# Patient Record
Sex: Female | Born: 1995 | Hispanic: No | Smoking: Current some day smoker
Health system: Southern US, Community
[De-identification: ages and names within clinical notes are randomized; demographics above are authoritative.]

## PROBLEM LIST (undated history)

## (undated) ENCOUNTER — Inpatient Hospital Stay (HOSPITAL_COMMUNITY): Payer: Self-pay

## (undated) DIAGNOSIS — R51 Headache: Secondary | ICD-10-CM

## (undated) DIAGNOSIS — F32A Depression, unspecified: Secondary | ICD-10-CM

## (undated) DIAGNOSIS — F329 Major depressive disorder, single episode, unspecified: Secondary | ICD-10-CM

## (undated) DIAGNOSIS — R569 Unspecified convulsions: Secondary | ICD-10-CM

## (undated) DIAGNOSIS — G40909 Epilepsy, unspecified, not intractable, without status epilepticus: Secondary | ICD-10-CM

## (undated) DIAGNOSIS — F419 Anxiety disorder, unspecified: Secondary | ICD-10-CM

## (undated) DIAGNOSIS — E669 Obesity, unspecified: Secondary | ICD-10-CM

## (undated) DIAGNOSIS — F909 Attention-deficit hyperactivity disorder, unspecified type: Secondary | ICD-10-CM

## (undated) HISTORY — DX: Epilepsy, unspecified, not intractable, without status epilepticus: G40.909

## (undated) HISTORY — DX: Depression, unspecified: F32.A

## (undated) HISTORY — DX: Anxiety disorder, unspecified: F41.9

## (undated) HISTORY — PX: ANKLE SURGERY: SHX546

## (undated) HISTORY — PX: ADENOIDECTOMY: SUR15

## (undated) HISTORY — PX: TONSILLECTOMY: SUR1361

## (undated) HISTORY — DX: Major depressive disorder, single episode, unspecified: F32.9

---
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
## 2017-03-10 ENCOUNTER — Ambulatory Visit: Payer: Self-pay | Admitting: Family Medicine

## 2017-03-11 ENCOUNTER — Encounter: Payer: Self-pay | Admitting: Family Medicine

## 2017-10-29 ENCOUNTER — Other Ambulatory Visit: Payer: Self-pay

## 2017-10-29 ENCOUNTER — Emergency Department (HOSPITAL_COMMUNITY)
Admission: EM | Admit: 2017-10-29 | Discharge: 2017-10-29 | Disposition: A | Discharge status: Home / Self Care | Payer: Medicaid Other | Attending: Emergency Medicine | Admitting: Emergency Medicine

## 2017-10-29 ENCOUNTER — Encounter (HOSPITAL_COMMUNITY): Payer: Self-pay | Admitting: *Deleted

## 2017-10-29 ENCOUNTER — Emergency Department (HOSPITAL_COMMUNITY): Payer: Medicaid Other

## 2017-10-29 DIAGNOSIS — Y939 Activity, unspecified: Secondary | ICD-10-CM | POA: Diagnosis not present

## 2017-10-29 DIAGNOSIS — S20229A Contusion of unspecified back wall of thorax, initial encounter: Secondary | ICD-10-CM | POA: Insufficient documentation

## 2017-10-29 DIAGNOSIS — S39012A Strain of muscle, fascia and tendon of lower back, initial encounter: Secondary | ICD-10-CM | POA: Diagnosis not present

## 2017-10-29 DIAGNOSIS — Z87891 Personal history of nicotine dependence: Secondary | ICD-10-CM | POA: Diagnosis not present

## 2017-10-29 DIAGNOSIS — Y999 Unspecified external cause status: Secondary | ICD-10-CM | POA: Diagnosis not present

## 2017-10-29 DIAGNOSIS — Y929 Unspecified place or not applicable: Secondary | ICD-10-CM | POA: Insufficient documentation

## 2017-10-29 DIAGNOSIS — Z041 Encounter for examination and observation following transport accident: Secondary | ICD-10-CM | POA: Diagnosis present

## 2017-10-29 LAB — PREGNANCY, URINE: PREG TEST UR: NEGATIVE
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
## 2018-01-03 ENCOUNTER — Encounter (HOSPITAL_COMMUNITY): Payer: Self-pay | Admitting: Emergency Medicine

## 2018-01-03 ENCOUNTER — Emergency Department (HOSPITAL_COMMUNITY)
Admission: EM | Admit: 2018-01-03 | Discharge: 2018-01-03 | Disposition: A | Discharge status: Home / Self Care | Payer: Medicaid Other | Attending: Emergency Medicine | Admitting: Emergency Medicine

## 2018-01-03 ENCOUNTER — Other Ambulatory Visit: Payer: Self-pay

## 2018-01-03 DIAGNOSIS — Z87891 Personal history of nicotine dependence: Secondary | ICD-10-CM | POA: Diagnosis not present

## 2018-01-03 DIAGNOSIS — R1010 Upper abdominal pain, unspecified: Secondary | ICD-10-CM | POA: Insufficient documentation

## 2018-01-03 DIAGNOSIS — N938 Other specified abnormal uterine and vaginal bleeding: Secondary | ICD-10-CM

## 2018-01-03 DIAGNOSIS — Z91010 Allergy to peanuts: Secondary | ICD-10-CM | POA: Insufficient documentation

## 2018-01-03 LAB — URINALYSIS, ROUTINE W REFLEX MICROSCOPIC
Bilirubin Urine: NEGATIVE
GLUCOSE, UA: NEGATIVE mg/dL
HGB URINE DIPSTICK: NEGATIVE
KETONES UR: NEGATIVE mg/dL
Leukocytes, UA: NEGATIVE
Nitrite: NEGATIVE
PROTEIN: NEGATIVE mg/dL
Specific Gravity, Urine: 1.029 (ref 1.005–1.030)
pH: 6 (ref 5.0–8.0)

## 2018-01-03 LAB — CBC WITH DIFFERENTIAL/PLATELET
BASOS ABS: 0 10*3/uL (ref 0.0–0.1)
Basophils Relative: 0 %
EOS ABS: 0.2 10*3/uL (ref 0.0–0.7)
EOS PCT: 1 %
HCT: 43.9 % (ref 36.0–46.0)
Hemoglobin: 14.8 g/dL (ref 12.0–15.0)
LYMPHS ABS: 1.6 10*3/uL (ref 0.7–4.0)
Lymphocytes Relative: 12 %
MCH: 30.6 pg (ref 26.0–34.0)
MCHC: 33.7 g/dL (ref 30.0–36.0)
MCV: 90.9 fL (ref 78.0–100.0)
Monocytes Absolute: 0.8 10*3/uL (ref 0.1–1.0)
Monocytes Relative: 6 %
Neutro Abs: 11.1 10*3/uL — ABNORMAL HIGH (ref 1.7–7.7)
Neutrophils Relative %: 81 %
PLATELETS: 274 10*3/uL (ref 150–400)
RBC: 4.83 MIL/uL (ref 3.87–5.11)
RDW: 12.5 % (ref 11.5–15.5)
WBC: 13.6 10*3/uL — AB (ref 4.0–10.5)

## 2018-01-03 LAB — WET PREP, GENITAL
Clue Cells Wet Prep HPF POC: NONE SEEN
Sperm: NONE SEEN
Trich, Wet Prep: NONE SEEN
YEAST WET PREP: NONE SEEN

## 2018-01-03 LAB — BASIC METABOLIC PANEL
Anion gap: 4 — ABNORMAL LOW (ref 5–15)
BUN: 12 mg/dL (ref 6–20)
CALCIUM: 8.3 mg/dL — AB (ref 8.9–10.3)
CO2: 26 mmol/L (ref 22–32)
Chloride: 108 mmol/L (ref 98–111)
Creatinine, Ser: 0.73 mg/dL (ref 0.44–1.00)
GFR calc Af Amer: >60 mL/min (ref >60)
GLUCOSE: 110 mg/dL — AB (ref 70–99)
POTASSIUM: 3.7 mmol/L (ref 3.5–5.1)
SODIUM: 138 mmol/L (ref 135–145)

## 2018-01-03 LAB — I-STAT BETA HCG BLOOD, ED (MC, WL, AP ONLY): I-stat hCG, quantitative: <5 m[IU]/mL (ref <5)

## 2018-01-03 MED ORDER — SODIUM CHLORIDE 0.9 % IV BOLUS
1000.0000 mL | Freq: Once | INTRAVENOUS | Status: AC
  Administered 2018-01-03: 1000 mL via INTRAVENOUS

## 2018-01-03 NOTE — ED Provider Notes (Signed)
[facility] EMERGENCY DEPARTMENT Provider Note   CSN: [id number] Arrival date & time: 01/03/18  1634     History   Chief Complaint Chief Complaint  Patient presents with  . Vaginal Bleeding    HPI Pamela Mccarty is a 22 y.o. female.  Pt presents to the ED today with heavy vaginal bleeding.  Pt has a hx of irregular periods and only gets them every few months.  She's had heavy bleeding for the past 4 days.  She said she passed out last night, which worried her and caused her to come in today.  The pt has never seen obgyn.  She does also c/o upper abdominal pain.     Past Medical History:  Diagnosis Date  . ADHD (attention deficit hyperactivity disorder)   . Anxiety   . Depression   . Headache(784.0)   . Obesity     Patient Active Problem List   Diagnosis Date Noted  . Opioid use disorder, severe, dependence (HCC) 12/10/2015  . Suicidal ideation   . 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 Attention-deficit hyperactivity disorder, 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  . Oppositional defiant disorder 06/28/2011    Past Surgical History:  Procedure Laterality Date  . ADENOIDECTOMY    . ANKLE SURGERY    . ANKLE SURGERY  at 22yo   Pt. reports to lengthen her tendons  . TONSILLECTOMY       OB History    Gravida  2   Para      Term      Preterm      AB  1   Living        SAB      TAB      Ectopic      Multiple      Live Births               Home Medications    Prior to Admission medications   Not on File    Family History Family History  Problem Relation Age of Onset  . Bipolar disorder Mother   . Cancer Mother   . Diabetes Mother   . Hypertension Mother   . Hypertension Father   . Stroke Father     Social History Social History   Tobacco Use  . Smoking status: Former Smoker    Packs/day: 0.10    Types: Cigarettes    Last attempt to quit: 01/13/2016    Years since quitting: 1.9  .  Smokeless tobacco: Never Used  Substance Use Topics  . Alcohol use: No  . Drug use: No     Allergies   Peanut-containing drug products   Review of Systems Review of Systems  Gastrointestinal: Positive for abdominal pain.  Genitourinary: Positive for vaginal bleeding.  All other systems reviewed and are negative.    Physical Exam Updated Vital Signs BP 133/79 (BP Location: Right Arm)   Pulse (!) 124   Temp 98.2 F (36.8 C) (Oral)   Resp 20   Ht 5\' 6"  (1.676 m)   Wt 72.6 kg   LMP 08/19/2017   SpO2 98%   BMI 25.82 kg/m   Physical Exam  Constitutional: She is oriented to person, place, and time. She appears well-developed and well-nourished.  HENT:  Head: Normocephalic and atraumatic.  Right Ear: External ear normal.  Left Ear: External ear normal.  Nose: Nose normal.  Mouth/Throat: Oropharynx is clear and moist.  Eyes: Pupils are equal, round, and reactive  to light. Conjunctivae and EOM are normal.  Neck: Normal range of motion. Neck supple.  Cardiovascular: Regular rhythm, normal heart sounds and intact distal pulses. Tachycardia present.  Pulmonary/Chest: Effort normal and breath sounds normal.  Abdominal: Soft. Bowel sounds are normal.  Genitourinary: Uterus is tender. Cervix exhibits no motion tenderness. Right adnexum displays no tenderness. Left adnexum displays no tenderness. There is bleeding in the vagina.  Musculoskeletal: Normal range of motion.  Neurological: She is alert and oriented to person, place, and time.  Skin: Skin is warm. Capillary refill takes less than 2 seconds.  Psychiatric: She has a normal mood and affect. Her behavior is normal. Judgment and thought content normal.  Nursing note and vitals reviewed.    ED Treatments / Results  Labs (all labs ordered are listed, but only abnormal results are displayed) Labs Reviewed  WET PREP, GENITAL - Abnormal; Notable for the following components:      Result Value   WBC, Wet Prep HPF POC FEW  (*)    All other components within normal limits  CBC WITH DIFFERENTIAL/PLATELET - Abnormal; Notable for the following components:   WBC 13.6 (*)    Neutro Abs 11.1 (*)    All other components within normal limits  BASIC METABOLIC PANEL - Abnormal; Notable for the following components:   Glucose, Bld 110 (*)    Calcium 8.3 (*)    Anion gap 4 (*)    All other components within normal limits  URINALYSIS, ROUTINE W REFLEX MICROSCOPIC  I-STAT BETA HCG BLOOD, ED ([facility], [facility], [facility] ONLY)  I-STAT BETA HCG BLOOD, ED ([facility], [facility], [facility] ONLY)  GC/CHLAMYDIA PROBE AMP ([place]) NOT AT [facility]    EKG None  Radiology No results found.  Procedures Procedures (including critical care time)  Medications Ordered in ED Medications  sodium chloride 0.9 % bolus 1,000 mL (0 mLs Intravenous Stopped 01/03/18 1953)     Initial Impression / Assessment and Plan / ED Course  I have reviewed the triage vital signs and the nursing notes.  Pertinent labs & imaging results that were available during my care of the patient were reviewed by me and considered in my medical decision making (see chart for details).    Labs are ok.  Pt is feeling better after IVFs.  She is instructed to f/u with obgyn and return if worse.  Final Clinical Impressions(s) / ED Diagnoses   Final diagnoses:  Dysfunctional uterine bleeding    ED Discharge Orders    None       Jacalyn Lefevre, MD 01/03/18 2109

## 2018-01-03 NOTE — ED Notes (Signed)
Pt ambulatory to bathroom and back to room 

## 2018-01-03 NOTE — ED Notes (Signed)
Pelvic cart at bedside. 

## 2018-01-03 NOTE — ED Triage Notes (Signed)
Patient c/o heavy vaginal bleeding x4 days. Per patient medium sized clots. Patient states upper abd pain. Per patient nausea and vomiting. Per boyfriend patient had LOC x 20 minutes last night in which patient's eyes rolled in back of head.

## 2018-01-05 LAB — GC/CHLAMYDIA PROBE AMP (~~LOC~~) NOT AT ARMC
Chlamydia: NEGATIVE
NEISSERIA GONORRHEA: NEGATIVE

## 2018-01-19 ENCOUNTER — Encounter: Payer: Self-pay | Admitting: Obstetrics & Gynecology

## 2018-01-19 ENCOUNTER — Ambulatory Visit (INDEPENDENT_AMBULATORY_CARE_PROVIDER_SITE_OTHER): Payer: Medicaid Other | Admitting: Obstetrics & Gynecology

## 2018-01-19 VITALS — BP 132/81 | HR 92 | Ht 66.0 in | Wt 226.0 lb

## 2018-01-19 DIAGNOSIS — N970 Female infertility associated with anovulation: Secondary | ICD-10-CM | POA: Diagnosis not present

## 2018-01-19 MED ORDER — DESOGESTREL-ETHINYL ESTRADIOL 0.15-30 MG-MCG PO TABS
1.0000 | ORAL_TABLET | Freq: Every day | ORAL | 11 refills | Status: DC
Start: 2018-01-19 — End: 2020-03-14

## 2018-01-19 MED ORDER — MEGESTROL ACETATE 40 MG PO TABS: ORAL_TABLET | ORAL | 0 refills | Status: DC

## 2018-01-19 NOTE — Progress Notes (Signed)
Patient ID: Pamela Mccarty, female   DOB: Dec 30, 1995, 22 y.o.   MRN: [id number]      Chief Complaint  Patient presents with  . Follow-up      22 y.o. G2P0010 Patient's last menstrual period was 12/31/2017. The current method of family planning is none.  Outpatient Encounter Medications as of 01/19/2018  Medication Sig  . desogestrel-ethinyl estradiol (APRI,EMOQUETTE,SOLIA) 0.15-30 MG-MCG tablet Take 1 tablet by mouth daily.  . megestrol (MEGACE) 40 MG tablet 1 tablet daily   No facility-administered encounter medications on file as of 01/19/2018.     Subjective Pt is seen as  Follow up from ED Long history of irregualr menses 3-4 per year since she started Heavy periods prolonged bleeding several times a year Labs normal Past Medical History:  Diagnosis Date  . ADHD (attention deficit hyperactivity disorder)   . Anxiety   . Depression   . Headache(784.0)   . Obesity     Past Surgical History:  Procedure Laterality Date  . ADENOIDECTOMY    . ANKLE SURGERY    . ANKLE SURGERY  at 22yo   Pt. reports to lengthen her tendons  . TONSILLECTOMY      OB History    Gravida  2   Para      Term      Preterm      AB  1   Living        SAB      TAB      Ectopic      Multiple      Live Births              Allergies  Allergen Reactions  . Peanut-Containing Drug Products Anaphylaxis    Stomach bubbles with Peanut oil only  (Patient states that she is not allergic to this)  . Latex     Social History   Socioeconomic History  . Marital status: Single    Spouse name: Not on file  . Number of children: Not on file  . Years of education: Not on file  . Highest education level: Not on file  Occupational History  . Occupation: Consulting civil engineer    Comment: 8th grade at [employer]  Social Needs  . Financial resource strain: Not on file  . Food insecurity:    Worry: Not on file    Inability: Not on file  . Transportation needs:   Medical: Not on file    Non-medical: Not on file  Tobacco Use  . Smoking status: Former Smoker    Packs/day: 0.10    Types: Cigarettes    Last attempt to quit: 01/13/2016    Years since quitting: 2.0  . Smokeless tobacco: Never Used  Substance and Sexual Activity  . Alcohol use: No  . Drug use: No  . Sexual activity: Never    Comment: reports she is "gay", has had a boyfriend in past, but states she is only interested in girls, does not want mom to know  Lifestyle  . Physical activity:    Days per week: Not on file    Minutes per session: Not on file  . Stress: Not on file  Relationships  . Social connections:    Talks on phone: Not on file    Gets together: Not on file    Attends religious service: Not on file    Active member of club or organization: Not on file    Attends meetings of clubs or organizations: Not  on file    Relationship status: Not on file  Other Topics Concern  . Not on file  Social History Narrative  . Not on file    Family History  Problem Relation Age of Onset  . Bipolar disorder Mother   . Cancer Mother   . Diabetes Mother   . Hypertension Mother   . Hypertension Father   . Stroke Father     Medications:       Current Outpatient Medications:  .  desogestrel-ethinyl estradiol (APRI,EMOQUETTE,SOLIA) 0.15-30 MG-MCG tablet, Take 1 tablet by mouth daily., Disp: 1 Package, Rfl: 11 .  megestrol (MEGACE) 40 MG tablet, 1 tablet daily, Disp: 30 tablet, Rfl: 0  Objective Blood pressure 132/81, pulse 92, height 5\' 6"  (1.676 m), weight 226 lb (102.5 kg), last menstrual period 12/31/2017.  Gen obese WF NAD   Pertinent ROS No burning with urination, frequency or urgency No nausea, vomiting or diarrhea Nor fever chills or other constitutional symptoms   Labs or studies reviewed    Impression Diagnoses this Encounter::   ICD-10-CM   1. Anovulatory (dysfunctional uterine) bleeding N97.0     Established relevant  diagnosis(es):   Plan/Recommendations: Meds ordered this encounter  Medications  . megestrol (MEGACE) 40 MG tablet    Sig: 1 tablet daily    Dispense:  30 tablet    Refill:  0  . desogestrel-ethinyl estradiol (APRI,EMOQUETTE,SOLIA) 0.15-30 MG-MCG tablet    Sig: Take 1 tablet by mouth daily.    Dispense:  1 Package    Refill:  11    Labs or Scans Ordered: No orders of the defined types were placed in this encounter.   Management:: Megestrol for 3 weeks then OCP for anouvlatory bleeding Probable PCOS  Follow up Return in about 6 months (around 07/21/2018) for Follow up, with Dr Despina Hidden.     Face to face time:  20 minutes  Greater than 50% of the visit time was spent in counseling and coordination of care with the patient.  The summary and outline of the counseling and care coordination is summarized in the note above.   All questions were answered.      All questions were answered.

## 2018-02-09 ENCOUNTER — Telehealth: Payer: Self-pay | Admitting: Obstetrics & Gynecology

## 2018-02-09 NOTE — Telephone Encounter (Signed)
Called patient back and heard message that mailbox was full. Will try again tomorrow.

## 2018-02-09 NOTE — Telephone Encounter (Signed)
P Patient called, stated that she thinks the medication that she was prescribed is causing her to not sleep and vomit blood.  She's not sure if this is a side affect or not or if she needs to be seen. [facility] [place]  [phone]

## 2018-02-27 ENCOUNTER — Emergency Department (HOSPITAL_COMMUNITY): Payer: Medicaid Other

## 2018-02-27 ENCOUNTER — Other Ambulatory Visit: Payer: Self-pay

## 2018-02-27 ENCOUNTER — Encounter (HOSPITAL_COMMUNITY): Payer: Self-pay | Admitting: Emergency Medicine

## 2018-02-27 ENCOUNTER — Observation Stay (HOSPITAL_COMMUNITY)
Admission: EM | Admit: 2018-02-27 | Discharge: 2018-02-28 | Disposition: A | Discharge status: Home / Self Care | Payer: Medicaid Other | Attending: Internal Medicine | Admitting: Internal Medicine

## 2018-02-27 DIAGNOSIS — Z91010 Allergy to peanuts: Secondary | ICD-10-CM | POA: Diagnosis not present

## 2018-02-27 DIAGNOSIS — R569 Unspecified convulsions: Principal | ICD-10-CM | POA: Insufficient documentation

## 2018-02-27 DIAGNOSIS — Z91040 Latex allergy status: Secondary | ICD-10-CM | POA: Insufficient documentation

## 2018-02-27 DIAGNOSIS — F17210 Nicotine dependence, cigarettes, uncomplicated: Secondary | ICD-10-CM | POA: Insufficient documentation

## 2018-02-27 DIAGNOSIS — G40909 Epilepsy, unspecified, not intractable, without status epilepticus: Secondary | ICD-10-CM

## 2018-02-27 HISTORY — DX: Unspecified convulsions: R56.9

## 2018-02-27 LAB — I-STAT CHEM 8, ED
BUN: 14 mg/dL (ref 6–20)
CALCIUM ION: 1.24 mmol/L (ref 1.15–1.40)
CREATININE: 0.8 mg/dL (ref 0.44–1.00)
Chloride: 107 mmol/L (ref 98–111)
GLUCOSE: 83 mg/dL (ref 70–99)
HEMATOCRIT: 45 % (ref 36.0–46.0)
HEMOGLOBIN: 15.3 g/dL — AB (ref 12.0–15.0)
Potassium: 3.8 mmol/L (ref 3.5–5.1)
Sodium: 142 mmol/L (ref 135–145)
TCO2: 25 mmol/L (ref 22–32)

## 2018-02-27 LAB — COMPREHENSIVE METABOLIC PANEL
ALT: 19 U/L (ref 0–44)
AST: 19 U/L (ref 15–41)
Albumin: 4.2 g/dL (ref 3.5–5.0)
Alkaline Phosphatase: 52 U/L (ref 38–126)
Anion gap: 7 (ref 5–15)
BILIRUBIN TOTAL: 0.5 mg/dL (ref 0.3–1.2)
BUN: 13 mg/dL (ref 6–20)
CO2: 23 mmol/L (ref 22–32)
Calcium: 9.2 mg/dL (ref 8.9–10.3)
Chloride: 108 mmol/L (ref 98–111)
Creatinine, Ser: 0.75 mg/dL (ref 0.44–1.00)
Glucose, Bld: 87 mg/dL (ref 70–99)
Potassium: 3.7 mmol/L (ref 3.5–5.1)
Sodium: 138 mmol/L (ref 135–145)
Total Protein: 7.9 g/dL (ref 6.5–8.1)

## 2018-02-27 LAB — URINALYSIS, ROUTINE W REFLEX MICROSCOPIC
BILIRUBIN URINE: NEGATIVE
Bacteria, UA: NONE SEEN
Glucose, UA: NEGATIVE mg/dL
Ketones, ur: NEGATIVE mg/dL
LEUKOCYTES UA: NEGATIVE
Nitrite: NEGATIVE
PH: 7 (ref 5.0–8.0)
Protein, ur: NEGATIVE mg/dL
RBC / HPF: >50 RBC/hpf — ABNORMAL HIGH (ref 0–5)
SPECIFIC GRAVITY, URINE: 1.019 (ref 1.005–1.030)

## 2018-02-27 LAB — CBC WITH DIFFERENTIAL/PLATELET
Abs Immature Granulocytes: 0.01 10*3/uL (ref 0.00–0.07)
Basophils Absolute: 0 10*3/uL (ref 0.0–0.1)
Basophils Relative: 0 %
EOS ABS: 0.2 10*3/uL (ref 0.0–0.5)
EOS PCT: 2 %
HEMATOCRIT: 47.5 % — AB (ref 36.0–46.0)
Hemoglobin: 15.1 g/dL — ABNORMAL HIGH (ref 12.0–15.0)
IMMATURE GRANULOCYTES: 0 %
Lymphocytes Relative: 32 %
Lymphs Abs: 2.3 10*3/uL (ref 0.7–4.0)
MCH: 28.4 pg (ref 26.0–34.0)
MCHC: 31.8 g/dL (ref 30.0–36.0)
MCV: 89.3 fL (ref 80.0–100.0)
MONO ABS: 0.6 10*3/uL (ref 0.1–1.0)
MONOS PCT: 9 %
Neutro Abs: 3.9 10*3/uL (ref 1.7–7.7)
Neutrophils Relative %: 57 %
PLATELETS: 302 10*3/uL (ref 150–400)
RBC: 5.32 MIL/uL — ABNORMAL HIGH (ref 3.87–5.11)
RDW: 12.1 % (ref 11.5–15.5)
WBC: 7 10*3/uL (ref 4.0–10.5)
nRBC: 0 % (ref 0.0–0.2)

## 2018-02-27 LAB — RAPID URINE DRUG SCREEN, HOSP PERFORMED
AMPHETAMINES: NOT DETECTED
BARBITURATES: NOT DETECTED
BENZODIAZEPINES: NOT DETECTED
COCAINE: NOT DETECTED
OPIATES: NOT DETECTED
TETRAHYDROCANNABINOL: NOT DETECTED

## 2018-02-27 LAB — MAGNESIUM: Magnesium: 2 mg/dL (ref 1.7–2.4)

## 2018-02-27 LAB — I-STAT BETA HCG BLOOD, ED (MC, WL, AP ONLY)

## 2018-02-27 MED ORDER — ONDANSETRON HCL 4 MG PO TABS: 4.0000 mg | ORAL_TABLET | Freq: Four times a day (QID) | ORAL | Status: DC | PRN

## 2018-02-27 MED ORDER — SODIUM CHLORIDE 0.9 % IV SOLN
75.0000 mL/h | INTRAVENOUS | Status: DC
  Administered 2018-02-27: 75 mL/h via INTRAVENOUS

## 2018-02-27 MED ORDER — BISACODYL 5 MG PO TBEC: 5.0000 mg | DELAYED_RELEASE_TABLET | Freq: Every day | ORAL | Status: DC | PRN

## 2018-02-27 MED ORDER — ENOXAPARIN SODIUM 40 MG/0.4ML ~~LOC~~ SOLN
40.0000 mg | SUBCUTANEOUS | Status: DC
  Administered 2018-02-27: 40 mg via SUBCUTANEOUS
  Filled 2018-02-27: qty 0.4

## 2018-02-27 MED ORDER — LORAZEPAM 2 MG/ML IJ SOLN
4.0000 mg | INTRAMUSCULAR | Status: AC
  Administered 2018-02-27: 4 mg via INTRAVENOUS
  Filled 2018-02-27: qty 2

## 2018-02-27 MED ORDER — LEVETIRACETAM IN NACL 1000 MG/100ML IV SOLN
1000.0000 mg | Freq: Once | INTRAVENOUS | Status: AC
  Administered 2018-02-27: 1000 mg via INTRAVENOUS
  Filled 2018-02-27: qty 100

## 2018-02-27 MED ORDER — ONDANSETRON HCL 4 MG/2ML IJ SOLN: 4.0000 mg | Freq: Four times a day (QID) | INTRAMUSCULAR | Status: DC | PRN

## 2018-02-27 MED ORDER — LEVETIRACETAM 500 MG PO TABS
500.0000 mg | ORAL_TABLET | Freq: Two times a day (BID) | ORAL | Status: DC
  Administered 2018-02-27 – 2018-02-28 (×2): 500 mg via ORAL
  Filled 2018-02-27 (×2): qty 1

## 2018-02-27 MED ORDER — SODIUM CHLORIDE 0.9 % IV BOLUS
1000.0000 mL | Freq: Once | INTRAVENOUS | Status: AC
  Administered 2018-02-27: 1000 mL via INTRAVENOUS

## 2018-02-27 NOTE — H&P (Signed)
History and Physical  Pamela Mccarty ZOX:[id number] DOB: [birth year] DOA: 02/27/2018  Referring physician: Dr Aileen Pilot, ED physician PCP: Patient, No Pcp Per  Outpatient Specialists: none  Patient Coming From: home  Chief Complaint: seizure disorder  HPI: Pamela Mccarty is a 22 y.o. female with a history of seizure disorder, ADHD, depression.  Patient has been followed by Dr. Despina Hidden for her abnormal uterine bleeding.  He prescribed her Megace and OCPs.  She took 1 of her OCPs last night.  He reportedly had seizures throughout the night with 8 more seizures during the day today.  Her last seizure was over a year ago.  She is not currently taking anti-epileptics.  No palliating or provoking factors.  No seizures noted in the hospital today in the ED.  Her seizures are focal with movement of her left arm and right leg.  Additionally, following seizures, the patient has side pain, which she does complain of today.  Denies any other new medications.  Emergency Department Course: She loaded with Keppra 1000 mg IV.  EDP discussed patient with neurology, who recommended observation and continuing Keppra 500 mg twice daily.  If she remains stable, then she can follow-up outpatient for an MRI and neurology consult  Review of Systems:   Pt denies any fevers, chills, nausea, vomiting, diarrhea, constipation, abdominal pain, shortness of breath, dyspnea on exertion, orthopnea, cough, wheezing, palpitations, headache, vision changes, lightheadedness, dizziness, melena, rectal bleeding.  Review of systems are otherwise negative  Past Medical History:  Diagnosis Date  . ADHD (attention deficit hyperactivity disorder)   . Anxiety   . Depression   . Headache(784.0)   . Obesity   . Seizures (HCC)    Past Surgical History:  Procedure Laterality Date  . ADENOIDECTOMY    . ANKLE SURGERY    . ANKLE SURGERY  at 22yo   Pt. reports to lengthen her tendons  . TONSILLECTOMY     Social History:  reports  that she has been smoking cigarettes. She has been smoking about 0.10 packs per day. She has never used smokeless tobacco. She reports that she does not drink alcohol or use drugs. Patient lives at home  Allergies  Allergen Reactions  . Peanut-Containing Drug Products Anaphylaxis    Stomach bubbles with Peanut oil only  (Patient states that she is not allergic to this)  . Latex     Family History  Problem Relation Age of Onset  . Bipolar disorder Mother   . Cancer Mother   . Diabetes Mother   . Hypertension Mother   . Hypertension Father   . Stroke Father       Prior to Admission medications   Medication Sig Start Date End Date Taking? Authorizing Provider  desogestrel-ethinyl estradiol (APRI,EMOQUETTE,SOLIA) 0.15-30 MG-MCG tablet Take 1 tablet by mouth daily. 01/19/18  Yes Lazaro Arms, MD  megestrol (MEGACE) 40 MG tablet 1 tablet daily Patient not taking: Reported on 02/27/2018 01/19/18   Lazaro Arms, MD    Physical Exam: BP 120/78   Pulse 90   Temp 99 F (37.2 C) (Oral)   Resp (!) 28   Ht 5\' 6"  (1.676 m)   Wt 79.4 kg   LMP 02/24/2018   SpO2 100%   BMI 28.25 kg/m   . General: Young Caucasian female. Awake and alert and oriented x3. No acute cardiopulmonary distress.  Marland Kitchen HEENT: Normocephalic atraumatic.  Right and left ears normal in appearance.  Pupils equal, round, reactive to light. Extraocular muscles are intact.  Sclerae anicteric and noninjected.  Moist mucosal membranes. No mucosal lesions.  . Neck: Neck supple without lymphadenopathy. No carotid bruits. No masses palpated.  . Cardiovascular: Regular rate with normal S1-S2 sounds. No murmurs, rubs, gallops auscultated. No JVD.  Marland Kitchen Respiratory: Good respiratory effort with no wheezes, rales, rhonchi. Lungs clear to auscultation bilaterally.  No accessory muscle use. . Abdomen: Soft, nontender, nondistended. Active bowel sounds. No masses or hepatosplenomegaly  . Skin: No rashes, lesions, or ulcerations.  Dry,  warm to touch. 2+ dorsalis pedis and radial pulses. . Musculoskeletal: No calf or leg pain. All major joints not erythematous nontender.  No upper or lower joint deformation.  Good ROM.  No contractures  . Psychiatric: Intact judgment and insight. Pleasant and cooperative. . Neurologic: No focal neurological deficits. Strength is 5/5 and symmetric in upper and lower extremities.  Cranial nerves II through XII are grossly intact.           Labs on Admission: I have personally reviewed following labs and imaging studies  CBC: Recent Labs  Lab 02/27/18 1550 02/27/18 1600  WBC 7.0  --   NEUTROABS 3.9  --   HGB 15.1* 15.3*  HCT 47.5* 45.0  MCV 89.3  --   PLT 302  --    Basic Metabolic Panel: Recent Labs  Lab 02/27/18 1550 02/27/18 1600  NA 138 142  K 3.7 3.8  CL 108 107  CO2 23  --   GLUCOSE 87 83  BUN 13 14  CREATININE 0.75 0.80  CALCIUM 9.2  --   MG 2.0  --    GFR: Estimated Creatinine Clearance: 119.2 mL/min (by C-G formula based on SCr of 0.8 mg/dL). Liver Function Tests: Recent Labs  Lab 02/27/18 1550  AST 19  ALT 19  ALKPHOS 52  BILITOT 0.5  PROT 7.9  ALBUMIN 4.2   No results for input(s): LIPASE, AMYLASE in the last 168 hours. No results for input(s): AMMONIA in the last 168 hours. Coagulation Profile: No results for input(s): INR, PROTIME in the last 168 hours. Cardiac Enzymes: No results for input(s): CKTOTAL, CKMB, CKMBINDEX, TROPONINI in the last 168 hours. BNP (last 3 results) No results for input(s): PROBNP in the last 8760 hours. HbA1C: No results for input(s): HGBA1C in the last 72 hours. CBG: No results for input(s): GLUCAP in the last 168 hours. Lipid Profile: No results for input(s): CHOL, HDL, LDLCALC, TRIG, CHOLHDL, LDLDIRECT in the last 72 hours. Thyroid Function Tests: No results for input(s): TSH, T4TOTAL, FREET4, T3FREE, THYROIDAB in the last 72 hours. Anemia Panel: No results for input(s): VITAMINB12, FOLATE, FERRITIN, TIBC, IRON,  RETICCTPCT in the last 72 hours. Urine analysis:    Component Value Date/Time   COLORURINE YELLOW 02/27/2018 1551   APPEARANCEUR CLOUDY (A) 02/27/2018 1551   LABSPEC 1.019 02/27/2018 1551   PHURINE 7.0 02/27/2018 1551   GLUCOSEU NEGATIVE 02/27/2018 1551   HGBUR LARGE (A) 02/27/2018 1551   BILIRUBINUR NEGATIVE 02/27/2018 1551   KETONESUR NEGATIVE 02/27/2018 1551   PROTEINUR NEGATIVE 02/27/2018 1551   NITRITE NEGATIVE 02/27/2018 1551   LEUKOCYTESUR NEGATIVE 02/27/2018 1551   Sepsis Labs: @LABRCNTIP (procalcitonin:4,lacticidven:4) )No results found for this or any previous visit (from the past 240 hour(s)).   Radiological Exams on Admission: Ct Head Wo Contrast  Result Date: 02/27/2018 CLINICAL DATA:  Altered level of consciousness. Syncopal episodes, seizures and heavy vaginal bleeding. EXAM: CT HEAD WITHOUT CONTRAST TECHNIQUE: Contiguous axial images were obtained from the base of the skull through the vertex without  intravenous contrast. COMPARISON:  None. FINDINGS: Brain: There is no evidence of acute intracranial hemorrhage, mass lesion, brain edema or extra-axial fluid collection. The ventricles and subarachnoid spaces are appropriately sized for age. There is no CT evidence of acute cortical infarction. Vascular:  No hyperdense vessel identified. Skull: Negative for fracture or focal lesion. Sinuses/Orbits: The visualized paranasal sinuses and mastoid air cells are clear. No orbital abnormalities are seen. Other: None. IMPRESSION: Negative noncontrast head CT. Electronically Signed   By: Carey BullocksWilliam  Veazey M.D.   On: 02/27/2018 17:12   Assessment/Plan: Active Problems:   Seizure [facility](HCC)    This patient was discussed with the ED physician, including pertinent vitals, physical exam findings, labs, and imaging.  We also discussed care given by the ED provider.  1. Seizure disorder a. Observation b. Seizure precautions c. OCPs are not noted to cause seizures d. UDS  negative e. Continue Keppra 500 mg twice daily f. Outpatient follow-up or MRI, EEG, neurology consult if stable  DVT prophylaxis: Lovenox Consultants: None Code Status: Full code Family Communication: Sister in room Disposition Plan: Should be able to return home following stabilization   Levie HeritageStinson, Malesha Suliman J, DO [facility] Pager [phone]  If 7PM-7AM, please contact night-coverage [email or web address] Password [email or web address]

## 2018-02-27 NOTE — ED Triage Notes (Addendum)
Patient states she started taking enskyce yesterday and started "blacking out, heavy vaginal bleeding, seizures, and lightheadedness." States she called PCP this morning and was told to go to ER. Family states she has had 11 seizures today. Patient states she is not taking seizure medication.

## 2018-02-27 NOTE — Progress Notes (Signed)
Patient refusing seizure precaution pads as ordered by provider. She states it makes her uncomfortable while in bed.RN reiterated the role and importance of pads/patient still still refuses at this time

## 2018-02-27 NOTE — ED Provider Notes (Addendum)
[facility] EMERGENCY DEPARTMENT Provider Note   CSN: [id number] Arrival date & time: 02/27/18  1514     History   Chief Complaint Chief Complaint  Patient presents with  . Medication Reaction    HPI Pamela Mccarty is a 22 y.o. female.  Patient supposedly had 8 seizures today.  She had 3 while she was asleep.  Her seizures usually last a few minutes and she shakes her left arm and her right leg.  Her last seizure was a year ago.  She was diagnosed when she was a child but is not on medicine now and is not being followed by anybody now for her seizures  The history is provided by the patient. No language interpreter was used.  Seizures   This is a recurrent problem. The current episode started 12 to 24 hours ago. The problem has been resolved. There were 6 to 10 seizures. Associated symptoms include sleepiness. Pertinent negatives include no headaches, no chest pain, no cough and no diarrhea. Characteristics do not include eye blinking. The episode was witnessed. There was no sensation of an aura present. The seizures did not continue in the ED. Focality: Move left arm and right leg. Possible causes do not include medication or dosage change. There has been no fever. There were no medications administered prior to arrival.    Past Medical History:  Diagnosis Date  . ADHD (attention deficit hyperactivity disorder)   . Anxiety   . Depression   . Headache(784.0)   . Obesity   . Seizures [facility])     Patient Active Problem List   Diagnosis Date Noted  . Opioid use disorder, severe, dependence (HCC) 12/10/2015  . Suicidal ideation   . 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 Attention-deficit hyperactivity disorder, 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  . Oppositional defiant disorder 06/28/2011    Past Surgical History:  Procedure Laterality Date  . ADENOIDECTOMY    . ANKLE SURGERY    . ANKLE SURGERY  at 22yo   Pt. reports to lengthen her  tendons  . TONSILLECTOMY       OB History    Gravida  2   Para      Term      Preterm      AB  1   Living        SAB      TAB      Ectopic      Multiple      Live Births               Home Medications    Prior to Admission medications   Medication Sig Start Date End Date Taking? Authorizing Provider  desogestrel-ethinyl estradiol (APRI,EMOQUETTE,SOLIA) 0.15-30 MG-MCG tablet Take 1 tablet by mouth daily. 01/19/18  Yes Lazaro Arms, MD  megestrol (MEGACE) 40 MG tablet 1 tablet daily Patient not taking: Reported on 02/27/2018 01/19/18   Lazaro Arms, MD    Family History Family History  Problem Relation Age of Onset  . Bipolar disorder Mother   . Cancer Mother   . Diabetes Mother   . Hypertension Mother   . Hypertension Father   . Stroke Father     Social History Social History   Tobacco Use  . Smoking status: Current Some Day Smoker    Packs/day: 0.10    Types: Cigarettes    Last attempt to quit: 01/13/2016    Years since quitting: 2.1  . Smokeless tobacco: Never  Used  Substance Use Topics  . Alcohol use: No  . Drug use: No     Allergies   Peanut-containing drug products and Latex   Review of Systems Review of Systems  Constitutional: Negative for appetite change and fatigue.  HENT: Negative for congestion, ear discharge and sinus pressure.   Eyes: Negative for discharge.  Respiratory: Negative for cough.   Cardiovascular: Negative for chest pain.  Gastrointestinal: Negative for abdominal pain and diarrhea.  Genitourinary: Negative for frequency and hematuria.  Musculoskeletal: Negative for back pain.  Skin: Negative for rash.  Neurological: Positive for seizures. Negative for headaches.  Psychiatric/Behavioral: Negative for hallucinations.     Physical Exam Updated Vital Signs BP 135/68 (BP Location: Right Arm)   Pulse (!) 106   Temp 99 F (37.2 C) (Oral)   Resp 18   Ht 5\' 6"  (1.676 m)   Wt 79.4 kg   LMP 02/24/2018    SpO2 100%   BMI 28.25 kg/m   Physical Exam  Constitutional: She is oriented to person, place, and time. She appears well-developed.  HENT:  Head: Normocephalic.  Eyes: Conjunctivae and EOM are normal. No scleral icterus.  Neck: Neck supple. No thyromegaly present.  Cardiovascular: Normal rate and regular rhythm. Exam reveals no gallop and no friction rub.  No murmur heard. Pulmonary/Chest: No stridor. She has no wheezes. She has no rales. She exhibits no tenderness.  Abdominal: She exhibits no distension. There is no tenderness. There is no rebound.  Musculoskeletal: Normal range of motion. She exhibits no edema.  Lymphadenopathy:    She has no cervical adenopathy.  Neurological: She is oriented to person, place, and time. She exhibits normal muscle tone. Coordination normal.  Skin: No rash noted. No erythema.  Psychiatric: She has a normal mood and affect. Her behavior is normal.     ED Treatments / Results  Labs (all labs ordered are listed, but only abnormal results are displayed) Labs Reviewed  CBC WITH DIFFERENTIAL/PLATELET - Abnormal; Notable for the following components:      Result Value   RBC 5.32 (*)    Hemoglobin 15.1 (*)    HCT 47.5 (*)    All other components within normal limits  URINALYSIS, ROUTINE W REFLEX MICROSCOPIC - Abnormal; Notable for the following components:   APPearance CLOUDY (*)    Hgb urine dipstick LARGE (*)    RBC / HPF >50 (*)    All other components within normal limits  I-STAT CHEM 8, ED - Abnormal; Notable for the following components:   Hemoglobin 15.3 (*)    All other components within normal limits  COMPREHENSIVE METABOLIC PANEL  RAPID URINE DRUG SCREEN, HOSP PERFORMED  MAGNESIUM  I-STAT BETA HCG BLOOD, ED ([facility], [facility], [facility] ONLY)    EKG EKG Interpretation  Date/Time:  Friday February 27 2018 16:08:44 EST Ventricular Rate:  99 PR Interval:    QRS Duration: 72 QT Interval:  332 QTC Calculation: 426 R Axis:   85 Text  Interpretation:  Sinus rhythm Minimal ST depression, inferior leads Confirmed by Bethann Berkshire [phone]) on 02/27/2018 6:20:34 PM   Radiology Ct Head Wo Contrast  Result Date: 02/27/2018 CLINICAL DATA:  Altered level of consciousness. Syncopal episodes, seizures and heavy vaginal bleeding. EXAM: CT HEAD WITHOUT CONTRAST TECHNIQUE: Contiguous axial images were obtained from the base of the skull through the vertex without intravenous contrast. COMPARISON:  None. FINDINGS: Brain: There is no evidence of acute intracranial hemorrhage, mass lesion, brain edema or extra-axial fluid collection. The  ventricles and subarachnoid spaces are appropriately sized for age. There is no CT evidence of acute cortical infarction. Vascular:  No hyperdense vessel identified. Skull: Negative for fracture or focal lesion. Sinuses/Orbits: The visualized paranasal sinuses and mastoid air cells are clear. No orbital abnormalities are seen. Other: None. IMPRESSION: Negative noncontrast head CT. Electronically Signed   By: Carey BullocksWilliam  Veazey M.D.   On: 02/27/2018 17:12    Procedures Procedures (including critical care time)  Medications Ordered in ED Medications  sodium chloride 0.9 % bolus 1,000 mL (1,000 mLs Intravenous New Bag/Given 02/27/18 1906)  levETIRAcetam (KEPPRA) IVPB 1000 mg/100 mL premix (0 mg Intravenous Stopped 02/27/18 1651)     Initial Impression / Assessment and Plan / ED Course  I have reviewed the triage vital signs and the nursing notes.  Pertinent labs & imaging results that were available during my care of the patient were reviewed by me and considered in my medical decision making (see chart for details).    CRITICAL CARE Performed by: Bethann BerkshireJoseph Devanee Pomplun Total critical care time: 40 minutes Critical care time was exclusive of separately billable procedures and treating other patients. Critical care was necessary to treat or prevent imminent or life-threatening deterioration. Critical care was time  spent personally by me on the following activities: development of treatment plan with patient and/or surrogate as well as nursing, discussions with consultants, evaluation of patient's response to treatment, examination of patient, obtaining history from patient or surrogate, ordering and performing treatments and interventions, ordering and review of laboratory studies, ordering and review of radiographic studies, pulse oximetry and re-evaluation of patient's condition.   She with a history of multiple seizures.  She is given IV Keppra.  I spoke with neurology and neurology felt like the [facility] and started  on Keppra 500 mg twice a day p.o. and MRI of the brain with EEG can be arranged as an outpatient.  Patient was instructed not to drive at all for the next 6 months  Final Clinical Impressions(s) / ED Diagnoses   Final diagnoses:  None    ED Discharge Orders    None       Bethann BerkshireZammit, Kayln Garceau, MD 02/27/18 Angelene [id number]    Bethann BerkshireZammit, Kinzie Wickes, MD 03/08/18 [phone]

## 2018-02-28 ENCOUNTER — Other Ambulatory Visit: Payer: Self-pay

## 2018-02-28 DIAGNOSIS — R569 Unspecified convulsions: Secondary | ICD-10-CM | POA: Diagnosis not present

## 2018-02-28 MED ORDER — LEVETIRACETAM 500 MG PO TABS: 500.0000 mg | ORAL_TABLET | Freq: Two times a day (BID) | ORAL | 3 refills | Status: DC

## 2018-02-28 MED ORDER — ENOXAPARIN SODIUM 60 MG/0.6ML ~~LOC~~ SOLN: 50.0000 mg | SUBCUTANEOUS | Status: DC

## 2018-02-28 NOTE — Progress Notes (Signed)
Patient noted to be in low ST on the monitor ranging 120-130 .Dr Onalee Huadavid notified via Amion of this finding. Vitals taken and are as follows : BP 132/80 HR 110 T 98.4  sats 99% RA  Patient denies any cardiac distress at this time. I will continue to monitor patient as shift ensues

## 2018-02-28 NOTE — Discharge Summary (Signed)
Physician Discharge Summary  Natali Lavallee ZOX:[id number] DOB: [birth year] DOA: 02/27/2018  PCP: Patient, No Pcp Per  Admit date: 02/27/2018  Discharge date: 02/28/2018  Admitted From:Home  Disposition:  Home  Recommendations for Outpatient Follow-up:  1. Follow up with PCP in 1-2 weeks 2. Follow up with Dr. Gerilyn Pilgrim for outpatient evaluation in 2 weeks 3. Continue on Keppra 500mg  PO BID at home  Home Health:None  Equipment/Devices:None  Discharge Condition:Stable  CODE STATUS: Full  Diet recommendation: Heart Healthy  Brief/Interim Summary: Per HPI from Dr. Adrian Blackwater: Jemmie Ledgerwood is a 22 y.o. female with a history of seizure disorder, ADHD, depression.  Patient has been followed by Dr. Despina Hidden for her abnormal uterine bleeding.  He prescribed her Megace and OCPs.  She took 1 of her OCPs last night.  He reportedly had seizures throughout the night with 8 more seizures during the day today.  Her last seizure was over a year ago.  She is not currently taking anti-epileptics.  No palliating or provoking factors.  No seizures noted in the hospital today in the ED.  Her seizures are focal with movement of her left arm and right leg.  Additionally, following seizures, the patient has side pain, which she does complain of today.  Denies any other new medications.  Patient was noted to have active seizures and was loaded with Keppra 1000 mg IV in the ED.  She was subsequently started on 500 mg twice a day per neurology recommendations.  She has done well overnight with no further seizures and is doing well this morning.  Neurology had recommended following up as an outpatient for MRI and neurology consult with possible EEG at that time.  Patient agrees to remain on her medications and states that she could afford them.  No other acute events during the course of this admission.  Discharge Diagnoses:  Active Problems:   Seizure [facility])  Principal discharge diagnosis: Seizure disorder  secondary to medication noncompliance.  Discharge Instructions  Discharge Instructions    Diet - low sodium heart healthy   Complete by:  As directed    Increase activity slowly   Complete by:  As directed      Allergies as of 02/28/2018      Reactions   Peanut-containing Drug Products Anaphylaxis   Stomach bubbles with Peanut oil only (Patient states that she is not allergic to this)   Latex       Medication List    TAKE these medications   desogestrel-ethinyl estradiol 0.15-30 MG-MCG tablet Commonly known as:  APRI,EMOQUETTE,SOLIA Take 1 tablet by mouth daily.   levETIRAcetam 500 MG tablet Commonly known as:  KEPPRA Take 1 tablet (500 mg total) by mouth 2 (two) times daily.   megestrol 40 MG tablet Commonly known as:  MEGACE 1 tablet daily      Follow-up Information    Beryle Beams, MD. Schedule an appointment as soon as possible for a visit in 2 week(s).   Specialty:  Neurology Contact information: [address] [place] [address] [phone]          Allergies  Allergen Reactions  . Peanut-Containing Drug Products Anaphylaxis    Stomach bubbles with Peanut oil only  (Patient states that she is not allergic to this)  . Latex     Consultations:  Neurology on phone   Procedures/Studies: Ct Head Wo Contrast  Result Date: 02/27/2018 CLINICAL DATA:  Altered level of consciousness. Syncopal episodes, seizures and heavy vaginal bleeding. EXAM: CT HEAD  WITHOUT CONTRAST TECHNIQUE: Contiguous axial images were obtained from the base of the skull through the vertex without intravenous contrast. COMPARISON:  None. FINDINGS: Brain: There is no evidence of acute intracranial hemorrhage, mass lesion, brain edema or extra-axial fluid collection. The ventricles and subarachnoid spaces are appropriately sized for age. There is no CT evidence of acute cortical infarction. Vascular:  No hyperdense vessel identified. Skull: Negative for fracture or focal  lesion. Sinuses/Orbits: The visualized paranasal sinuses and mastoid air cells are clear. No orbital abnormalities are seen. Other: None. IMPRESSION: Negative noncontrast head CT. Electronically Signed   By: Carey Bullocks M.D.   On: 02/27/2018 17:12    Discharge Exam: Vitals:   02/27/18 2040 02/28/18 0540  BP: (!) 153/106 132/80  Pulse: 94 98  Resp: 19 18  Temp: 99.2 F (37.3 C) 99 F (37.2 C)  SpO2: 100% 100%   Vitals:   02/27/18 1908 02/27/18 1930 02/27/18 2040 02/28/18 0540  BP: 128/79 120/78 (!) 153/106 132/80  Pulse: (!) 102 90 94 98  Resp: 18 (!) 28 19 18   Temp:   99.2 F (37.3 C) 99 F (37.2 C)  TempSrc:   Oral Oral  SpO2: 100%  100% 100%  Weight:   103.7 kg   Height:   5\' 6"  (1.676 m)     General: Pt is alert, awake, not in acute distress Cardiovascular: RRR, S1/S2 +, no rubs, no gallops Respiratory: CTA bilaterally, no wheezing, no rhonchi Abdominal: Soft, NT, ND, bowel sounds + Extremities: no edema, no cyanosis    The results of significant diagnostics from this hospitalization (including imaging, microbiology, ancillary and laboratory) are listed below for reference.     Microbiology: No results found for this or any previous visit (from the past 240 hour(s)).   Labs: BNP (last 3 results) No results for input(s): BNP in the last 8760 hours. Basic Metabolic Panel: Recent Labs  Lab 02/27/18 1550 02/27/18 1600  NA 138 142  K 3.7 3.8  CL 108 107  CO2 23  --   GLUCOSE 87 83  BUN 13 14  CREATININE 0.75 0.80  CALCIUM 9.2  --   MG 2.0  --    Liver Function Tests: Recent Labs  Lab 02/27/18 1550  AST 19  ALT 19  ALKPHOS 52  BILITOT 0.5  PROT 7.9  ALBUMIN 4.2   No results for input(s): LIPASE, AMYLASE in the last 168 hours. No results for input(s): AMMONIA in the last 168 hours. CBC: Recent Labs  Lab 02/27/18 1550 02/27/18 1600  WBC 7.0  --   NEUTROABS 3.9  --   HGB 15.1* 15.3*  HCT 47.5* 45.0  MCV 89.3  --   PLT 302  --     Cardiac Enzymes: No results for input(s): CKTOTAL, CKMB, CKMBINDEX, TROPONINI in the last 168 hours. BNP: Invalid input(s): POCBNP CBG: No results for input(s): GLUCAP in the last 168 hours. D-Dimer No results for input(s): DDIMER in the last 72 hours. Hgb A1c No results for input(s): HGBA1C in the last 72 hours. Lipid Profile No results for input(s): CHOL, HDL, LDLCALC, TRIG, CHOLHDL, LDLDIRECT in the last 72 hours. Thyroid function studies No results for input(s): TSH, T4TOTAL, T3FREE, THYROIDAB in the last 72 hours.  Invalid input(s): FREET3 Anemia work up No results for input(s): VITAMINB12, FOLATE, FERRITIN, TIBC, IRON, RETICCTPCT in the last 72 hours. Urinalysis    Component Value Date/Time   COLORURINE YELLOW 02/27/2018 1551   APPEARANCEUR CLOUDY (A) 02/27/2018 1551  LABSPEC 1.019 02/27/2018 1551   PHURINE 7.0 02/27/2018 1551   GLUCOSEU NEGATIVE 02/27/2018 1551   HGBUR LARGE (A) 02/27/2018 1551   BILIRUBINUR NEGATIVE 02/27/2018 1551   KETONESUR NEGATIVE 02/27/2018 1551   PROTEINUR NEGATIVE 02/27/2018 1551   NITRITE NEGATIVE 02/27/2018 1551   LEUKOCYTESUR NEGATIVE 02/27/2018 1551   Sepsis Labs Invalid input(s): PROCALCITONIN,  WBC,  LACTICIDVEN Microbiology No results found for this or any previous visit (from the past 240 hour(s)).   Time coordinating discharge: 35 minutes  SIGNED:   Erick BlinksPratik D Chatara Lucente, DO [facility] 02/28/2018, 9:44 AM Pager [phone]  If 7PM-7AM, please contact night-coverage [email or web address] Password [email or web address]

## 2018-02-28 NOTE — Progress Notes (Signed)
Discharged instructions, medications and follow-up appts reviewed w/pt and her boyfriend.  Pt in stable condition with no seizure activity reported.  Pt is discharged home.

## 2018-03-01 LAB — HIV ANTIBODY (ROUTINE TESTING W REFLEX): HIV Screen 4th Generation wRfx: NONREACTIVE

## 2018-07-24 ENCOUNTER — Telehealth: Payer: Self-pay | Admitting: *Deleted

## 2018-07-24 NOTE — Telephone Encounter (Signed)

## 2018-07-27 ENCOUNTER — Ambulatory Visit (INDEPENDENT_AMBULATORY_CARE_PROVIDER_SITE_OTHER): Payer: Medicaid Other | Admitting: Obstetrics and Gynecology

## 2018-07-27 ENCOUNTER — Other Ambulatory Visit: Payer: Self-pay

## 2018-07-27 ENCOUNTER — Encounter: Payer: Self-pay | Admitting: Obstetrics and Gynecology

## 2018-07-27 ENCOUNTER — Ambulatory Visit: Payer: Self-pay | Admitting: Obstetrics and Gynecology

## 2018-07-27 VITALS — BP 131/88 | HR 76 | Ht 66.0 in | Wt 243.0 lb

## 2018-07-27 DIAGNOSIS — N911 Secondary amenorrhea: Secondary | ICD-10-CM | POA: Diagnosis not present

## 2018-07-27 MED ORDER — MEDROXYPROGESTERONE ACETATE 10 MG PO TABS: 10.0000 mg | ORAL_TABLET | Freq: Every day | ORAL | 1 refills | Status: DC

## 2018-07-27 NOTE — Progress Notes (Signed)
   [facility] Clinic Visit  @DATE @            Patient name: Pamela Mccarty MRN [id number]  Date of birth: 06/09/1995  CC & HPI:  Pamela Mccarty is a 23 y.o. female presenting today for amenorrhea, infertility  ROS:  ROS menarche age 66, never regular, no hx thyroid abnl.   Pertinent History Reviewed:   Reviewed: Significant for G0p0. Medical         Past Medical History:  Diagnosis Date  . ADHD (attention deficit hyperactivity disorder)   . Anxiety   . Depression   . Headache(784.0)   . Obesity   . Seizures (HCC)                               Surgical Hx:    Past Surgical History:  Procedure Laterality Date  . ADENOIDECTOMY    . ANKLE SURGERY    . ANKLE SURGERY  at 23yo   Pt. reports to lengthen her tendons  . TONSILLECTOMY     Medications: Reviewed & Updated - see associated section                       Current Outpatient Medications:  .  megestrol (MEGACE) 40 MG tablet, 1 tablet daily, Disp: 30 tablet, Rfl: 0 .  desogestrel-ethinyl estradiol (APRI,EMOQUETTE,SOLIA) 0.15-30 MG-MCG tablet, Take 1 tablet by mouth daily. (Patient not taking: Reported on 07/27/2018), Disp: 1 Package, Rfl: 11 .  levETIRAcetam (KEPPRA) 500 MG tablet, Take 1 tablet (500 mg total) by mouth 2 (two) times daily., Disp: 60 tablet, Rfl: 3   Social History: Reviewed -  reports that she has been smoking cigarettes. She has been smoking about 0.10 packs per day. She has never used smokeless tobacco.  Objective Findings:  Vitals: Blood pressure 131/88, pulse 76, height 5\' 6"  (1.676 m), weight 243 lb (110.2 kg), last menstrual period 04/03/2018.  PHYSICAL EXAMINATION General appearance - alert, well appearing, and in no distress and overweight Mental status - alert, oriented to person, place, and time, normal mood, behavior, speech, dress, motor activity, and thought processes Chest -  Heart - normal rate and regular rhythm Abdomen - soft, nontender, nondistended, no masses or  organomegaly Breasts -  Skin -     Assessment & Plan:   A:  1.  amenorrhea, suspect anovulation  P:  1.  tsh, fsh, progesterone. 2. 2 rx: provera x 10 mg x 10 d 3. Consider ovulation stimulation.

## 2018-07-28 ENCOUNTER — Telehealth: Payer: Self-pay | Admitting: Obstetrics and Gynecology

## 2018-07-28 LAB — FOLLICLE STIMULATING HORMONE: FSH: 7.3 m[IU]/mL

## 2018-07-28 LAB — PROGESTERONE: Progesterone: 0.2 ng/mL

## 2018-07-28 LAB — TSH: TSH: 1.97 u[IU]/mL (ref 0.450–4.500)

## 2018-07-28 NOTE — Telephone Encounter (Signed)
Unable to reach pt on home or cell phones.

## 2018-08-17 ENCOUNTER — Telehealth: Payer: Self-pay | Admitting: *Deleted

## 2018-08-17 NOTE — Telephone Encounter (Signed)
Pt aware to be looking for our phone call for televisit. Pt voiced understanding. JSY

## 2018-08-18 ENCOUNTER — Telehealth: Payer: Self-pay | Admitting: Obstetrics and Gynecology

## 2018-08-18 ENCOUNTER — Ambulatory Visit (INDEPENDENT_AMBULATORY_CARE_PROVIDER_SITE_OTHER): Payer: Medicaid Other | Admitting: Obstetrics and Gynecology

## 2018-08-18 ENCOUNTER — Encounter: Payer: Self-pay | Admitting: Obstetrics and Gynecology

## 2018-08-18 ENCOUNTER — Other Ambulatory Visit: Payer: Self-pay

## 2018-08-18 DIAGNOSIS — N970 Female infertility associated with anovulation: Secondary | ICD-10-CM | POA: Diagnosis not present

## 2018-08-18 MED ORDER — CLOMIPHENE CITRATE 50 MG PO TABS: 50.0000 mg | ORAL_TABLET | Freq: Every day | ORAL | 3 refills | Status: DC

## 2018-08-18 NOTE — Telephone Encounter (Signed)
Message left that we tried to call pt on land home line,and Mobile.

## 2018-08-18 NOTE — Progress Notes (Signed)
Patient ID: Pamela Mccarty, female   DOB: 1995-08-30, 23 y.o.   MRN: [id number]    TELEHEALTH VIRTUAL GYNECOLOGY VISIT ENCOUNTER NOTE  I connected with Pamela Mccarty on @TODAY @ at  4:00 PM EDT by telephone at home and verified that I am speaking with the correct person using two identifiers.   I discussed the limitations, risks, security and privacy concerns of performing an evaluation and management service by telephone and the availability of in person appointments. I also discussed with the patient that there may be a patient responsible charge related to this service. The patient expressed understanding and agreed to proceed.   History:  Pamela Mccarty is a 23 y.o. G27P0010 female being evaluated today for f/u of amenorrhea, suspect anovulation. Was prescribed provera 10 mg x 10 days. She denies any abnormal vaginal discharge, bleeding, pelvic pain or other concerns.    Lab results are reviewed, TSH normal at 1.9, FSH premenopausal, and progesterone level 0.2, anovulatory   Past Medical History:  Diagnosis Date  . ADHD (attention deficit hyperactivity disorder)   . Anxiety   . Depression   . Headache(784.0)   . Obesity   . Seizures (HCC)    Past Surgical History:  Procedure Laterality Date  . ADENOIDECTOMY    . ANKLE SURGERY    . ANKLE SURGERY  at 23yo   Pt. reports to lengthen her tendons  . TONSILLECTOMY     The following portions of the patient's history were reviewed and updated as appropriate: allergies, current medications, past family history, past medical history, past social history, past surgical history and problem list.   Health Maintenance:  No pap on file, due to age.  Review of Systems:  Pertinent items noted in HPI and remainder of comprehensive ROS otherwise negative. Patient desires to be pregnant at this time and wishes to find out if she can achieve ovulation to make pregnancy an option Physical Exam:   General:  Alert, oriented and cooperative.    Mental Status: Normal mood and affect perceived. Normal judgment and thought content.  Physical exam deferred due to nature of the encounter  Labs and Imaging:   Assessment and Plan:      Chronic anovulation Normal thyroid function tests   Plan is to prescribe clomiphene 50 mg daily x5 days, patient to assume ovulation will occur approximately 7 days after completion of last clomiphene pill, then to check serum progesterone level 14 days after last clomiphene (day 21)   I discussed the assessment and treatment plan with the patient. The patient was provided an opportunity to ask questions and all were answered. The patient agreed with the plan and demonstrated an understanding of the instructions.   The patient was advised to call back or seek an in-person evaluation/go to the ED if the symptoms worsen or if the condition fails to improve as anticipated.  I provided 12 minutes of non-face-to-face time during this encounter.   By signing my name below, I, Arnette Norris, attest that this documentation has been prepared under the direction and in the presence of Tilda Burrow, MD. Electronically Signed: Arnette Norris Medical Scribe. 08/18/18. 4:54 PM.  I personally performed the services described in this documentation, which was SCRIBED in my presence. The recorded information has been reviewed and considered accurate. It has been edited as necessary during review. Tilda Burrow, MD

## 2019-03-24 ENCOUNTER — Emergency Department (HOSPITAL_COMMUNITY)
Admission: EM | Admit: 2019-03-24 | Discharge: 2019-03-24 | Disposition: A | Discharge status: Home / Self Care | Payer: Medicaid Other | Attending: Emergency Medicine | Admitting: Emergency Medicine

## 2019-03-24 ENCOUNTER — Other Ambulatory Visit: Payer: Self-pay

## 2019-03-24 ENCOUNTER — Emergency Department (HOSPITAL_COMMUNITY): Payer: Medicaid Other

## 2019-03-24 ENCOUNTER — Encounter (HOSPITAL_COMMUNITY): Payer: Self-pay

## 2019-03-24 DIAGNOSIS — Z91010 Allergy to peanuts: Secondary | ICD-10-CM | POA: Insufficient documentation

## 2019-03-24 DIAGNOSIS — Z91040 Latex allergy status: Secondary | ICD-10-CM | POA: Insufficient documentation

## 2019-03-24 DIAGNOSIS — Y9389 Activity, other specified: Secondary | ICD-10-CM | POA: Diagnosis not present

## 2019-03-24 DIAGNOSIS — R6884 Jaw pain: Secondary | ICD-10-CM | POA: Diagnosis not present

## 2019-03-24 DIAGNOSIS — Y999 Unspecified external cause status: Secondary | ICD-10-CM | POA: Diagnosis not present

## 2019-03-24 DIAGNOSIS — F17210 Nicotine dependence, cigarettes, uncomplicated: Secondary | ICD-10-CM | POA: Diagnosis not present

## 2019-03-24 DIAGNOSIS — Y929 Unspecified place or not applicable: Secondary | ICD-10-CM | POA: Insufficient documentation

## 2019-03-24 DIAGNOSIS — Z79899 Other long term (current) drug therapy: Secondary | ICD-10-CM | POA: Diagnosis not present

## 2019-03-24 DIAGNOSIS — W01198A Fall on same level from slipping, tripping and stumbling with subsequent striking against other object, initial encounter: Secondary | ICD-10-CM | POA: Diagnosis not present

## 2019-03-24 MED ORDER — ACETAMINOPHEN 500 MG PO TABS
1000.0000 mg | ORAL_TABLET | Freq: Once | ORAL | Status: AC
  Administered 2019-03-24: 02:00:00 1000 mg via ORAL
  Filled 2019-03-24: qty 2

## 2019-03-24 MED ORDER — OXYCODONE HCL 5 MG PO TABS
5.0000 mg | ORAL_TABLET | Freq: Once | ORAL | Status: AC
  Administered 2019-03-24: 02:00:00 5 mg via ORAL
  Filled 2019-03-24: qty 1

## 2019-03-24 NOTE — ED Triage Notes (Signed)
Pt reports falling Sunday, hitting face on wooden porch, minimal pain Monday with pain increasing, pt says is throbbing now. Pt reports difficulty eating, talking, and sleeping. Pt reports taking tylenol, oragel, ibuprofen with no relief.

## 2019-03-24 NOTE — ED Provider Notes (Signed)
[facility] EMERGENCY DEPARTMENT Provider Note   CSN: [id number] Arrival date & time: 03/24/19  0005     History Chief Complaint  Patient presents with  . Fall    left jaw pain    [name] is a 23 y.o. female.  23 yo F with a chief complaint of fall.  This occurred about 4 days ago.  Patient tripped over one of her cats and she landed face first onto the stairs.  She felt that the pain was not terrible initially but is slowly worsened.  Having trouble chewing and eating.  Denies other injury.  Pain is left-sided and sharp starts about the area of the ear and radiates down the jaw.  The history is provided by the patient.  Fall This is a new problem. The problem occurs constantly. The problem has not changed since onset.Pertinent negatives include no chest pain, no headaches and no shortness of breath. The symptoms are aggravated by eating. Nothing relieves the symptoms. She has tried nothing for the symptoms. The treatment provided no relief.       Past Medical History:  Diagnosis Date  . ADHD (attention deficit hyperactivity disorder)   . Anxiety   . Depression   . Headache(784.0)   . Obesity   . Seizures [facility](HCC)     Patient Active Problem List   Diagnosis Date Noted  . Seizure (HCC) 02/27/2018  . Opioid use disorder, severe, dependence (HCC) 12/10/2015  . Suicidal ideation   . 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 Attention-deficit hyperactivity disorder, 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  . Oppositional defiant disorder 06/28/2011    Past Surgical History:  Procedure Laterality Date  . ADENOIDECTOMY    . ANKLE SURGERY    . ANKLE SURGERY  at 23yo   Pt. reports to lengthen her tendons  . TONSILLECTOMY       OB History    Gravida  2   Para      Term      Preterm      AB  1   Living        SAB      TAB      Ectopic      Multiple      Live Births              Family History  Problem  Relation Age of Onset  . Bipolar disorder Mother   . Cancer Mother   . Diabetes Mother   . Hypertension Mother   . Hypertension Father   . Stroke Father     Social History   Tobacco Use  . Smoking status: Current Some Day Smoker    Packs/day: 0.10    Types: Cigarettes    Last attempt to quit: 01/13/2016    Years since quitting: 3.1  . Smokeless tobacco: Never Used  Substance Use Topics  . Alcohol use: No  . Drug use: No    Home Medications Prior to Admission medications   Medication Sig Start Date End Date Taking? Authorizing Provider  clomiPHENE (CLOMID) 50 MG tablet Take 1 tablet (50 mg total) by mouth daily. Expect ovulation 7 days after last pill 08/18/18   Tilda BurrowFerguson, John V, MD  desogestrel-ethinyl estradiol (APRI,EMOQUETTE,SOLIA) 0.15-30 MG-MCG tablet Take 1 tablet by mouth daily. Patient not taking: Reported on 07/27/2018 01/19/18   Lazaro ArmsEure, Luther H, MD  FLUoxetine (PROZAC) 20 MG tablet Take 20 mg by mouth daily.    [provider]  levETIRAcetam (KEPPRA) 500 MG tablet Take 1 tablet (500 mg total) by mouth 2 (two) times daily. 02/28/18 03/30/18  Sherryll Burger, Pratik D, DO  levETIRAcetam (KEPPRA) 500 MG tablet Take 500 mg by mouth 2 (two) times daily. 04/26/18   [provider]  medroxyPROGESTERone (PROVERA) 10 MG tablet Take 1 tablet (10 mg total) by mouth daily. One tablet daily x 10 d to bring on menses 07/27/18   Tilda Burrow, MD  megestrol (MEGACE) 40 MG tablet 1 tablet daily Patient not taking: Reported on 08/18/2018 01/19/18   Lazaro Arms, MD  trazodone (DESYREL) 300 MG tablet Take 500 mg by mouth at bedtime.    [provider]    Allergies    Peanut-containing drug products and Latex  Review of Systems   Review of Systems  Constitutional: Negative for chills and fever.  HENT: Negative for congestion and rhinorrhea.        Left-sided jaw pain  Eyes: Negative for redness and visual disturbance.  Respiratory: Negative for shortness of breath  and wheezing.   Cardiovascular: Negative for chest pain and palpitations.  Gastrointestinal: Negative for nausea and vomiting.  Genitourinary: Negative for dysuria and urgency.  Musculoskeletal: Negative for arthralgias and myalgias.  Skin: Negative for pallor and wound.  Neurological: Negative for dizziness and headaches.    Physical Exam Updated Vital Signs BP 130/75 (BP Location: Right Arm)   Pulse 93   Temp (!) 97 F (36.1 C) (Temporal)   Resp 18   Ht 5\' 7"  (1.702 m)   Wt 74.8 kg   SpO2 98%   BMI 25.84 kg/m   Physical Exam Vitals and nursing note reviewed.  Constitutional:      General: She is not in acute distress.    Appearance: She is well-developed. She is not diaphoretic.  HENT:     Head: Normocephalic and atraumatic.     Comments: No appreciable swelling to the jaw.  2 finger trismus.  No obvious dental pathology.  Left TM without effusion erythema or bulging.  Able to rotate her head 45 degrees in either direction without pain.  Mild pain to the left TMJ. Eyes:     Pupils: Pupils are equal, round, and reactive to light.  Cardiovascular:     Rate and Rhythm: Normal rate and regular rhythm.     Heart sounds: No murmur. No friction rub. No gallop.   Pulmonary:     Effort: Pulmonary effort is normal.     Breath sounds: No wheezing or rales.  Abdominal:     General: There is no distension.     Palpations: Abdomen is soft.     Tenderness: There is no abdominal tenderness.  Musculoskeletal:        General: No tenderness.     Cervical back: Normal range of motion and neck supple.  Skin:    General: Skin is warm and dry.  Neurological:     Mental Status: She is alert and oriented to person, place, and time.  Psychiatric:        Behavior: Behavior normal.     ED Results / Procedures / Treatments   Labs (all labs ordered are listed, but only abnormal results are displayed) Labs Reviewed - No data to display  EKG None  Radiology No results  found.  Procedures Procedures (including critical care time)  Medications Ordered in ED Medications  acetaminophen (TYLENOL) tablet 1,000 mg (has no administration in time range)  oxyCODONE (Oxy IR/ROXICODONE) immediate release tablet 5  mg (has no administration in time range)    ED Course  I have reviewed the triage vital signs and the nursing notes.  Pertinent labs & imaging results that were available during my care of the patient were reviewed by me and considered in my medical decision making (see chart for details).    MDM Rules/Calculators/A&P                      23 yo F with a chief complaint of left-sided lower jaw pain.  No obvious deformities here edema but the patient does have 2 finger trismus.  Will obtain a CT scan to evaluate for bony injury.  CT scan is negative for fracture.  Discharged home.  5:54 AM:  I have discussed the diagnosis/risks/treatment options with the patient and believe the pt to be eligible for discharge home to follow-up with PCP. We also discussed returning to the ED immediately if new or worsening sx occur. We discussed the sx which are most concerning (e.g., sudden worsening pain, fever, inability to tolerate by mouth) that necessitate immediate return. Medications administered to the patient during their visit and any new prescriptions provided to the patient are listed below.  Medications given during this visit Medications  acetaminophen (TYLENOL) tablet 1,000 mg (1,000 mg Oral Given 03/24/19 0143)  oxyCODONE (Oxy IR/ROXICODONE) immediate release tablet 5 mg (5 mg Oral Given 03/24/19 0143)     The patient appears reasonably screen and/or stabilized for discharge and I doubt any other medical condition or other [facility] requiring further screening, evaluation, or treatment in the ED at this time prior to discharge.   Final Clinical Impression(s) / ED Diagnoses Final diagnoses:  None    Rx / DC Orders ED Discharge Orders    None        Deno Etienne, DO 03/24/19 [phone]

## 2019-03-24 NOTE — ED Notes (Signed)
Pt states she tripped over her cat 3 days ago and is having pain to her left jaw

## 2019-03-24 NOTE — Discharge Instructions (Signed)
Take 4 over the counter ibuprofen tablets 3 times a day or 2 over-the-counter naproxen tablets twice a day for pain. Also take tylenol 1000mg(2 extra strength) four times a day.    

## 2019-06-14 ENCOUNTER — Other Ambulatory Visit: Payer: Self-pay

## 2019-06-14 ENCOUNTER — Ambulatory Visit: Payer: Medicaid Other | Attending: Internal Medicine

## 2019-06-14 DIAGNOSIS — Z20822 Contact with and (suspected) exposure to covid-19: Secondary | ICD-10-CM

## 2019-06-15 LAB — NOVEL CORONAVIRUS, NAA: SARS-CoV-2, NAA: NOT DETECTED

## 2019-06-22 ENCOUNTER — Telehealth: Payer: Self-pay

## 2019-06-22 NOTE — Telephone Encounter (Signed)
Pt. Given COVID 19 results, verbalizes understanding. 

## 2019-07-11 ENCOUNTER — Other Ambulatory Visit: Payer: Self-pay

## 2019-07-11 ENCOUNTER — Emergency Department (HOSPITAL_COMMUNITY)
Admission: EM | Admit: 2019-07-11 | Discharge: 2019-07-11 | Disposition: A | Discharge status: Home / Self Care | Payer: Medicaid Other | Attending: Emergency Medicine | Admitting: Emergency Medicine

## 2019-07-11 ENCOUNTER — Emergency Department (HOSPITAL_COMMUNITY): Payer: Medicaid Other

## 2019-07-11 ENCOUNTER — Encounter (HOSPITAL_COMMUNITY): Payer: Self-pay | Admitting: Emergency Medicine

## 2019-07-11 DIAGNOSIS — R112 Nausea with vomiting, unspecified: Secondary | ICD-10-CM

## 2019-07-11 DIAGNOSIS — Z91040 Latex allergy status: Secondary | ICD-10-CM | POA: Insufficient documentation

## 2019-07-11 DIAGNOSIS — Z79899 Other long term (current) drug therapy: Secondary | ICD-10-CM | POA: Insufficient documentation

## 2019-07-11 DIAGNOSIS — R1031 Right lower quadrant pain: Secondary | ICD-10-CM | POA: Diagnosis present

## 2019-07-11 DIAGNOSIS — R0789 Other chest pain: Secondary | ICD-10-CM | POA: Insufficient documentation

## 2019-07-11 DIAGNOSIS — K529 Noninfective gastroenteritis and colitis, unspecified: Secondary | ICD-10-CM | POA: Diagnosis not present

## 2019-07-11 DIAGNOSIS — F17210 Nicotine dependence, cigarettes, uncomplicated: Secondary | ICD-10-CM | POA: Diagnosis not present

## 2019-07-11 DIAGNOSIS — Z91010 Allergy to peanuts: Secondary | ICD-10-CM | POA: Insufficient documentation

## 2019-07-11 LAB — URINALYSIS, ROUTINE W REFLEX MICROSCOPIC
Bilirubin Urine: NEGATIVE
Glucose, UA: NEGATIVE mg/dL
Hgb urine dipstick: NEGATIVE
Ketones, ur: NEGATIVE mg/dL
Leukocytes,Ua: NEGATIVE
Nitrite: NEGATIVE
Protein, ur: NEGATIVE mg/dL
Specific Gravity, Urine: 1.019 (ref 1.005–1.030)
pH: 6 (ref 5.0–8.0)

## 2019-07-11 LAB — COMPREHENSIVE METABOLIC PANEL
ALT: 35 U/L (ref 0–44)
AST: 23 U/L (ref 15–41)
Albumin: 4 g/dL (ref 3.5–5.0)
Alkaline Phosphatase: 66 U/L (ref 38–126)
Anion gap: 6 (ref 5–15)
BUN: 16 mg/dL (ref 6–20)
CO2: 26 mmol/L (ref 22–32)
Calcium: 9.3 mg/dL (ref 8.9–10.3)
Chloride: 107 mmol/L (ref 98–111)
Creatinine, Ser: 0.76 mg/dL (ref 0.44–1.00)
GFR calc Af Amer: >60 mL/min (ref >60)
GFR calc non Af Amer: >60 mL/min (ref >60)
Glucose, Bld: 99 mg/dL (ref 70–99)
Potassium: 3.7 mmol/L (ref 3.5–5.1)
Sodium: 139 mmol/L (ref 135–145)
Total Bilirubin: 0.6 mg/dL (ref 0.3–1.2)
Total Protein: 7.1 g/dL (ref 6.5–8.1)

## 2019-07-11 LAB — CBC
HCT: 47.1 % — ABNORMAL HIGH (ref 36.0–46.0)
Hemoglobin: 15.7 g/dL — ABNORMAL HIGH (ref 12.0–15.0)
MCH: 29.9 pg (ref 26.0–34.0)
MCHC: 33.3 g/dL (ref 30.0–36.0)
MCV: 89.7 fL (ref 80.0–100.0)
Platelets: 260 10*3/uL (ref 150–400)
RBC: 5.25 MIL/uL — ABNORMAL HIGH (ref 3.87–5.11)
RDW: 11.9 % (ref 11.5–15.5)
WBC: 6.5 10*3/uL (ref 4.0–10.5)
nRBC: 0 % (ref 0.0–0.2)

## 2019-07-11 LAB — PREGNANCY, URINE: Preg Test, Ur: NEGATIVE

## 2019-07-11 LAB — LIPASE, BLOOD: Lipase: 27 U/L (ref 11–51)

## 2019-07-11 LAB — TROPONIN I (HIGH SENSITIVITY): Troponin I (High Sensitivity): <2 ng/L (ref <18)

## 2019-07-11 MED ORDER — MORPHINE SULFATE (PF) 4 MG/ML IV SOLN: 4.0000 mg | Freq: Once | INTRAVENOUS | Status: DC

## 2019-07-11 MED ORDER — ONDANSETRON HCL 4 MG/2ML IJ SOLN
4.0000 mg | Freq: Once | INTRAMUSCULAR | Status: AC
  Administered 2019-07-11: 20:00:00 4 mg via INTRAVENOUS
  Filled 2019-07-11: qty 2

## 2019-07-11 MED ORDER — SODIUM CHLORIDE 0.9% FLUSH: 3.0000 mL | Freq: Once | INTRAVENOUS | Status: DC

## 2019-07-11 MED ORDER — CIPROFLOXACIN HCL 500 MG PO TABS: 500.0000 mg | ORAL_TABLET | Freq: Two times a day (BID) | ORAL | 0 refills | Status: DC

## 2019-07-11 MED ORDER — IOHEXOL 300 MG/ML  SOLN
100.0000 mL | Freq: Once | INTRAMUSCULAR | Status: AC | PRN
  Administered 2019-07-11: 100 mL via INTRAVENOUS

## 2019-07-11 MED ORDER — SODIUM CHLORIDE 0.9 % IV BOLUS: 1000.0000 mL | Freq: Once | INTRAVENOUS | Status: DC

## 2019-07-11 MED ORDER — DIPHENOXYLATE-ATROPINE 2.5-0.025 MG PO TABS
2.0000 | ORAL_TABLET | Freq: Once | ORAL | Status: AC
  Administered 2019-07-11: 2 via ORAL
  Filled 2019-07-11: qty 2

## 2019-07-11 MED ORDER — METRONIDAZOLE 500 MG PO TABS: 500.0000 mg | ORAL_TABLET | Freq: Two times a day (BID) | ORAL | 0 refills | Status: DC

## 2019-07-11 MED ORDER — LOPERAMIDE HCL 2 MG PO CAPS: 2.0000 mg | ORAL_CAPSULE | Freq: Four times a day (QID) | ORAL | 0 refills | Status: DC | PRN

## 2019-07-11 MED ORDER — KETOROLAC TROMETHAMINE 30 MG/ML IJ SOLN
15.0000 mg | Freq: Once | INTRAMUSCULAR | Status: AC
  Administered 2019-07-11: 20:00:00 15 mg via INTRAVENOUS
  Filled 2019-07-11: qty 1

## 2019-07-11 MED ORDER — ONDANSETRON 4 MG PO TBDP: 4.0000 mg | ORAL_TABLET | Freq: Three times a day (TID) | ORAL | 0 refills | Status: DC | PRN

## 2019-07-11 NOTE — ED Triage Notes (Signed)
All over abd pain x 8 days, reports increased urination and burning with urination

## 2019-07-11 NOTE — ED Notes (Signed)
Out of bed to bathroom 

## 2019-07-11 NOTE — ED Provider Notes (Signed)
[facility] EMERGENCY DEPARTMENT Provider Note   CSN: [id number] Arrival date & time: 07/11/19  1808     History Chief Complaint  Patient presents with  . Abdominal Pain    Pamela Mccarty is a 24 y.o. female with a history of ADHD, depression, seizure disorder and prior hx of substance abuse presenting with an 8 day history of worsening abdominal pain, abdominal distension, nausea, vomiting and increased urinary frequency with intermittent hematuria.  She reports no PO intake x 24 hours.  Her pain started in the periumbilical area and migrates, sometimes in her RLQ, sometimes more severe in the RUQ and right flank region. She has also had non bloody diarrhea for the past 2 days.  She denies fevers or chills. also She has no appetite, pain is worsened with movement and palpation.  She denies vaginal discharge and denies being sexually active.   Additionally, she has had constant midsternal chest pain, described as pressure, constant x 3+ days, denies sob, cough, upper back pain.  Also no diaphoresis, palpitations.  Reports strong family hx of cardiac disease - mother MI at age 22.  She has had no treatment prior to arrival and has found no alleviators.   The history is provided by the patient.       Past Medical History:  Diagnosis Date  . ADHD (attention deficit hyperactivity disorder)   . Anxiety   . Depression   . Headache(784.0)   . Obesity   . Seizures [facility])     Patient Active Problem List   Diagnosis Date Noted  . Seizure (HCC) 02/27/2018  . Opioid use disorder, severe, dependence (HCC) 12/10/2015  . Suicidal ideation   . 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 Attention-deficit hyperactivity disorder, 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  . Oppositional defiant disorder 06/28/2011    Past Surgical History:  Procedure Laterality Date  . ADENOIDECTOMY    . ANKLE SURGERY    . ANKLE SURGERY  at 24yo   Pt. reports to lengthen her  tendons  . TONSILLECTOMY       OB History    Gravida  2   Para      Term      Preterm      AB  1   Living        SAB      TAB      Ectopic      Multiple      Live Births              Family History  Problem Relation Age of Onset  . Bipolar disorder Mother   . Cancer Mother   . Diabetes Mother   . Hypertension Mother   . Hypertension Father   . Stroke Father     Social History   Tobacco Use  . Smoking status: Current Some Day Smoker    Packs/day: 0.10    Types: Cigarettes    Last attempt to quit: 01/13/2016    Years since quitting: 3.4  . Smokeless tobacco: Never Used  Substance Use Topics  . Alcohol use: No  . Drug use: No    Home Medications Prior to Admission medications   Medication Sig Start Date End Date Taking? Authorizing Provider  ciprofloxacin (CIPRO) 500 MG tablet Take 1 tablet (500 mg total) by mouth every 12 (twelve) hours. 07/11/19   Burgess Amor, PA-C  clomiPHENE (CLOMID) 50 MG tablet Take 1 tablet (50 mg total) by mouth daily. Expect  ovulation 7 days after last pill 08/18/18   Jonnie Kind, MD  desogestrel-ethinyl estradiol (APRI,EMOQUETTE,SOLIA) 0.15-30 MG-MCG tablet Take 1 tablet by mouth daily. Patient not taking: Reported on 07/27/2018 01/19/18   Florian Buff, MD  FLUoxetine (PROZAC) 20 MG tablet Take 20 mg by mouth daily.    [provider]  levETIRAcetam (KEPPRA) 500 MG tablet Take 1 tablet (500 mg total) by mouth 2 (two) times daily. 02/28/18 03/30/18  Manuella Ghazi, Pratik D, DO  levETIRAcetam (KEPPRA) 500 MG tablet Take 500 mg by mouth 2 (two) times daily. 04/26/18   [provider]  loperamide (IMODIUM) 2 MG capsule Take 1 capsule (2 mg total) by mouth 4 (four) times daily as needed for diarrhea or loose stools. 07/11/19   Evalee Jefferson, PA-C  medroxyPROGESTERone (PROVERA) 10 MG tablet Take 1 tablet (10 mg total) by mouth daily. One tablet daily x 10 d to bring on menses 07/27/18   Jonnie Kind, MD  megestrol  (MEGACE) 40 MG tablet 1 tablet daily Patient not taking: Reported on 08/18/2018 01/19/18   Florian Buff, MD  metroNIDAZOLE (FLAGYL) 500 MG tablet Take 1 tablet (500 mg total) by mouth 2 (two) times daily. 07/11/19   Odean Fester, Almyra Free, PA-C  ondansetron (ZOFRAN ODT) 4 MG disintegrating tablet Take 1 tablet (4 mg total) by mouth every 8 (eight) hours as needed for nausea or vomiting. 07/11/19   Evalee Jefferson, PA-C  trazodone (DESYREL) 300 MG tablet Take 500 mg by mouth at bedtime.    [provider]    Allergies    Peanut-containing drug products and Latex  Review of Systems   Review of Systems  Constitutional: Positive for appetite change. Negative for chills and fever.  HENT: Negative for congestion and sore throat.   Eyes: Negative.   Respiratory: Negative for chest tightness, shortness of breath and wheezing.   Cardiovascular: Positive for chest pain. Negative for palpitations and leg swelling.  Gastrointestinal: Positive for abdominal distention, abdominal pain, nausea and vomiting. Negative for diarrhea.  Genitourinary: Positive for frequency and hematuria. Negative for dysuria.  Musculoskeletal: Negative for arthralgias, joint swelling and neck pain.  Skin: Negative.  Negative for rash and wound.  Neurological: Negative for dizziness, weakness, light-headedness, numbness and headaches.  Psychiatric/Behavioral: Negative.     Physical Exam Updated Vital Signs BP 132/80 (BP Location: Right Arm)   Pulse 70   Temp 98.1 F (36.7 C) (Oral)   Resp (!) 27   Ht 5\' 7"  (1.702 m)   Wt 107 kg   SpO2 100%   BMI 36.96 kg/m   Physical Exam Vitals and nursing note reviewed.  Constitutional:      Appearance: She is well-developed.  HENT:     Head: Normocephalic and atraumatic.  Eyes:     Conjunctiva/sclera: Conjunctivae normal.  Cardiovascular:     Rate and Rhythm: Normal rate and regular rhythm.     Heart sounds: Normal heart sounds.  Pulmonary:     Effort: Pulmonary effort is  normal.     Breath sounds: Normal breath sounds. No wheezing.  Abdominal:     General: Bowel sounds are normal.     Palpations: Abdomen is soft.     Tenderness: There is abdominal tenderness in the right lower quadrant and periumbilical area. There is no right CVA tenderness, left CVA tenderness or guarding. Positive signs include McBurney's sign. Negative signs include Murphy's sign and psoas sign.  Musculoskeletal:        General: Normal range of  motion.     Cervical back: Normal range of motion.  Skin:    General: Skin is warm and dry.  Neurological:     Mental Status: She is alert.     ED Results / Procedures / Treatments   Labs (all labs ordered are listed, but only abnormal results are displayed) Labs Reviewed  CBC - Abnormal; Notable for the following components:      Result Value   RBC 5.25 (*)    Hemoglobin 15.7 (*)    HCT 47.1 (*)    All other components within normal limits  LIPASE, BLOOD  COMPREHENSIVE METABOLIC PANEL  URINALYSIS, ROUTINE W REFLEX MICROSCOPIC  PREGNANCY, URINE  TROPONIN I (HIGH SENSITIVITY)    EKG EKG Interpretation  Date/Time:  Sunday July 11 2019 19:42:04 EDT Ventricular Rate:  75 PR Interval:    QRS Duration: 80 QT Interval:  368 QTC Calculation: 411 R Axis:   100 Text Interpretation: Right and left arm electrode reversal, interpretation assumes no reversal Sinus rhythm Borderline right axis deviation Borderline Q waves in lateral leads Abnormal T, probable ischemia, lateral leads needs repeat Confirmed by Meridee Score [phone]) on 07/11/2019 7:47:04 PM   EKG Interpretation  Date/Time:  Sunday July 11 2019 21:51:45 EDT Ventricular Rate:  66 PR Interval:    QRS Duration: 83 QT Interval:  387 QTC Calculation: 406 R Axis:   76 Text Interpretation: Sinus rhythm No significant change since prior 11/19 Confirmed by Meridee Score [phone]) on 07/11/2019 9:57:08 PM   Radiology CT ABDOMEN PELVIS W CONTRAST  Addendum Date: 07/11/2019     ADDENDUM REPORT: 07/11/2019 21:55 ADDENDUM: Addendum is created to correct an error in the findings section. The productive section should read as follows: Reproductive: The uterus is present and normal. Ovaries are symmetric. No adnexal mass. Electronically Signed   By: Narda Rutherford M.D.   On: 07/11/2019 21:55   Result Date: 07/11/2019 CLINICAL DATA:  Abdominal distension. Diffuse abdominal pain. EXAM: CT ABDOMEN AND PELVIS WITH CONTRAST TECHNIQUE: Multidetector CT imaging of the abdomen and pelvis was performed using the standard protocol following bolus administration of intravenous contrast. CONTRAST:  OMNIPAQUE IOHEXOL 300 MG/ML  SOLN COMPARISON:  Abdominal CT 11/19/2015 FINDINGS: Lower chest: Lung bases are clear. Hepatobiliary: No focal liver abnormality is seen. No gallstones, gallbladder wall thickening, or biliary dilatation. Pancreas: No ductal dilatation or inflammation. Spleen: Normal in size without focal abnormality. Adrenals/Urinary Tract: Normal adrenal glands. No hydronephrosis or perinephric edema. Homogeneous renal enhancement. Urinary bladder is physiologically distended without wall thickening. Equivocal perivesicular fat stranding about the dome. Stomach/Bowel: Ingested material within the stomach. No gastric wall thickening. No small bowel obstruction or inflammation. Normal appendix. Normal terminal ileum. There is submucosal fatty infiltration throughout the colon which limits assessment for wall thickening. Potential mild colonic wall thickening in the descending colon. Vascular/Lymphatic: Abdominal aorta and IVC are normal. The portal vein is patent. Prominent mesenteric nodes, greatest in the ileocolic chain measuring 10 mm short axis. Reproductive: Status post hysterectomy. No adnexal masses. Other: No free air, free fluid, or intra-abdominal fluid collection. Small fat containing umbilical hernia. Musculoskeletal: There are no acute or suspicious osseous abnormalities.  IMPRESSION: 1. Submucosal fatty infiltration throughout the colon limits assessment for wall thickening. Potential mild colonic wall thickening in the descending colon. This may be due to nondistention or mild colitis. 2. Prominent mesenteric nodes, greatest in the ileocolic chain, likely reactive. 3. Minimal perivesicular fat stranding about the dome of the urinary bladder, recommend correlation with  urinalysis to evaluate for urinary tract infection. Electronically Signed: By: Narda Rutherford M.D. On: 07/11/2019 21:04    Procedures Procedures (including critical care time)  Medications Ordered in ED Medications  ondansetron (ZOFRAN) injection 4 mg (4 mg Intravenous Given 07/11/19 1952)  ketorolac (TORADOL) 30 MG/ML injection 15 mg (15 mg Intravenous Given 07/11/19 1952)  iohexol (OMNIPAQUE) 300 MG/ML solution 100 mL (100 mLs Intravenous Contrast Given 07/11/19 2047)  diphenoxylate-atropine (LOMOTIL) 2.5-0.025 MG per tablet 2 tablet (2 tablets Oral Given 07/11/19 2204)    ED Course  I have reviewed the triage vital signs and the nursing notes.  Pertinent labs & imaging results that were available during my care of the patient were reviewed by me and considered in my medical decision making (see chart for details).    MDM Rules/Calculators/A&P                      Labs and imaging reviewed and discussed with patient.  She has multiple complaints, abd pain, urinary frequency, chest pain.  All labs are stable. Troponin x 1 normal (reports constant chest pain x 3 days), ekg normal. Doubt ACS in this 24 year old (although she reports mother MI in her 59's).  No sob, PE unlikely.   CT imaging suggesting early colitis.  No appendicitis.  She was given IV fluids, zofran given, tolerated PO fluids prior to dc.  cipro and flagyl started.  Imodium for diarrhea.  Patient is recently moved here from [place].  She was given referrals for obtaining a primary local doctor.  In the meantime strict return  precautions were outlined for return for any worsening symptoms.  The patient appears reasonably screened and/or stabilized for discharge and I doubt any other medical condition or other [facility] requiring further screening, evaluation, or treatment in the ED at this time prior to discharge.  Final Clinical Impression(s) / ED Diagnoses Final diagnoses:  Nausea vomiting and diarrhea  Colitis    Rx / DC Orders ED Discharge Orders         Ordered    ondansetron (ZOFRAN ODT) 4 MG disintegrating tablet  Every 8 hours PRN     07/11/19 2203    loperamide (IMODIUM) 2 MG capsule  4 times daily PRN     07/11/19 2203    ciprofloxacin (CIPRO) 500 MG tablet  Every 12 hours     07/11/19 2203    metroNIDAZOLE (FLAGYL) 500 MG tablet  2 times daily     07/11/19 2203           Burgess Amor, PA-C 07/12/19 0112    Terrilee Files, MD 07/12/19 [phone]

## 2019-07-11 NOTE — Discharge Instructions (Addendum)
Rest and make sure you are drinking plenty of fluids.  Good food choices for nausea, vomiting and diarrhea including bananas, rice, applesauce and toast (the BRAT diet).  Take the medicines prescribed for your symptoms.  You have also been prescribed antibiotics to treat your colitis (mild infection of the intestines).  Take the entire course of this medicine.

## 2019-07-11 NOTE — ED Notes (Signed)
Unable to assess pt as she continues on her phone   She speaks to an unknown person regarding her EKG, need for IV pain meds   While she has relaxed facial features is in NAD   And encouraging person to come here so they do not have to converse on the phone

## 2019-07-11 NOTE — ED Notes (Signed)
Pt reports abd pain for 8 days with UTI sx  She is speaking in her cell phone and appears in NAD Ambulates erect to the bathroom and is trying to provide a urine  She reports she has no physician as she has not been able to have her medicaid switched from [place] to [place]   Urine is provided and to the lab

## 2019-07-11 NOTE — ED Notes (Signed)
Pt to CT

## 2019-07-11 NOTE — ED Notes (Signed)
From CT 

## 2019-07-30 ENCOUNTER — Emergency Department (HOSPITAL_COMMUNITY): Payer: Medicaid Other

## 2019-07-30 ENCOUNTER — Other Ambulatory Visit: Payer: Self-pay

## 2019-07-30 ENCOUNTER — Emergency Department (HOSPITAL_COMMUNITY)
Admission: EM | Admit: 2019-07-30 | Discharge: 2019-07-30 | Disposition: A | Discharge status: Home / Self Care | Payer: Medicaid Other | Attending: Family Medicine | Admitting: Family Medicine

## 2019-07-30 ENCOUNTER — Encounter (HOSPITAL_COMMUNITY): Payer: Self-pay | Admitting: *Deleted

## 2019-07-30 DIAGNOSIS — Z79899 Other long term (current) drug therapy: Secondary | ICD-10-CM | POA: Insufficient documentation

## 2019-07-30 DIAGNOSIS — Z91010 Allergy to peanuts: Secondary | ICD-10-CM | POA: Diagnosis not present

## 2019-07-30 DIAGNOSIS — R569 Unspecified convulsions: Secondary | ICD-10-CM

## 2019-07-30 DIAGNOSIS — Z91040 Latex allergy status: Secondary | ICD-10-CM | POA: Diagnosis not present

## 2019-07-30 DIAGNOSIS — F909 Attention-deficit hyperactivity disorder, unspecified type: Secondary | ICD-10-CM | POA: Insufficient documentation

## 2019-07-30 DIAGNOSIS — G40909 Epilepsy, unspecified, not intractable, without status epilepticus: Secondary | ICD-10-CM | POA: Diagnosis not present

## 2019-07-30 DIAGNOSIS — F17210 Nicotine dependence, cigarettes, uncomplicated: Secondary | ICD-10-CM | POA: Insufficient documentation

## 2019-07-30 LAB — URINALYSIS, ROUTINE W REFLEX MICROSCOPIC
Bilirubin Urine: NEGATIVE
Glucose, UA: NEGATIVE mg/dL
Ketones, ur: 5 mg/dL — AB
Leukocytes,Ua: NEGATIVE
Nitrite: NEGATIVE
Protein, ur: NEGATIVE mg/dL
Specific Gravity, Urine: 1.025 (ref 1.005–1.030)
pH: 6 (ref 5.0–8.0)

## 2019-07-30 LAB — CBC WITH DIFFERENTIAL/PLATELET
Abs Immature Granulocytes: 0.02 10*3/uL (ref 0.00–0.07)
Basophils Absolute: 0.1 10*3/uL (ref 0.0–0.1)
Basophils Relative: 1 %
Eosinophils Absolute: 0.1 10*3/uL (ref 0.0–0.5)
Eosinophils Relative: 2 %
HCT: 49.4 % — ABNORMAL HIGH (ref 36.0–46.0)
Hemoglobin: 16.5 g/dL — ABNORMAL HIGH (ref 12.0–15.0)
Immature Granulocytes: 0 %
Lymphocytes Relative: 30 %
Lymphs Abs: 2.3 10*3/uL (ref 0.7–4.0)
MCH: 30.3 pg (ref 26.0–34.0)
MCHC: 33.4 g/dL (ref 30.0–36.0)
MCV: 90.8 fL (ref 80.0–100.0)
Monocytes Absolute: 0.5 10*3/uL (ref 0.1–1.0)
Monocytes Relative: 7 %
Neutro Abs: 4.5 10*3/uL (ref 1.7–7.7)
Neutrophils Relative %: 60 %
Platelets: 306 10*3/uL (ref 150–400)
RBC: 5.44 MIL/uL — ABNORMAL HIGH (ref 3.87–5.11)
RDW: 11.9 % (ref 11.5–15.5)
WBC: 7.5 10*3/uL (ref 4.0–10.5)
nRBC: 0 % (ref 0.0–0.2)

## 2019-07-30 LAB — RAPID URINE DRUG SCREEN, HOSP PERFORMED
Amphetamines: NOT DETECTED
Barbiturates: NOT DETECTED
Benzodiazepines: NOT DETECTED
Cocaine: NOT DETECTED
Opiates: NOT DETECTED
Tetrahydrocannabinol: POSITIVE — AB

## 2019-07-30 LAB — BASIC METABOLIC PANEL
Anion gap: 9 (ref 5–15)
BUN: 21 mg/dL — ABNORMAL HIGH (ref 6–20)
CO2: 24 mmol/L (ref 22–32)
Calcium: 9 mg/dL (ref 8.9–10.3)
Chloride: 104 mmol/L (ref 98–111)
Creatinine, Ser: 0.84 mg/dL (ref 0.44–1.00)
GFR calc Af Amer: >60 mL/min (ref >60)
GFR calc non Af Amer: >60 mL/min (ref >60)
Glucose, Bld: 77 mg/dL (ref 70–99)
Potassium: 3.6 mmol/L (ref 3.5–5.1)
Sodium: 137 mmol/L (ref 135–145)

## 2019-07-30 LAB — I-STAT BETA HCG BLOOD, ED (MC, WL, AP ONLY): I-stat hCG, quantitative: <5 m[IU]/mL (ref <5)

## 2019-07-30 MED ORDER — LEVETIRACETAM IN NACL 1000 MG/100ML IV SOLN
1000.0000 mg | Freq: Once | INTRAVENOUS | Status: AC
  Administered 2019-07-30: 16:00:00 1000 mg via INTRAVENOUS
  Filled 2019-07-30: qty 100

## 2019-07-30 MED ORDER — LEVETIRACETAM 500 MG PO TABS: 750.0000 mg | ORAL_TABLET | Freq: Two times a day (BID) | ORAL | Status: DC

## 2019-07-30 MED ORDER — ACETAMINOPHEN 325 MG PO TABS
650.0000 mg | ORAL_TABLET | Freq: Once | ORAL | Status: AC
  Administered 2019-07-30: 650 mg via ORAL
  Filled 2019-07-30: qty 2

## 2019-07-30 NOTE — ED Provider Notes (Signed)
[facility] EMERGENCY DEPARTMENT Provider Note   CSN: [id number] Arrival date & time: 07/30/19  1237     History Chief Complaint  Patient presents with  . Seizures    Pamela Mccarty is a 24 y.o. female with past medical history significant for seizure disorder on Keppra who presents to the ED via private vehicle after she sustained a witnessed focal seizure with some extremity movement while at work today lasting approximately 2 minutes duration.  Evidently a coworker observed her falling forward and hitting her head onto a counter on her way to the ground.  Patient reports that she has not been able to get established with a primary care provider or neurologist because her mother who passed away 4 years ago is her legal guardian and she states that she cannot change over her insurance.  She has been coming to the ER regularly for her Keppra medication refills and her ongoing seizure management.  Last Keppra prescription that I can see is from 04/26/2018.  She states that she has been experiencing seizures on a regular basis, 16 in the past month.  She states that they are accompanied with extremity convulsions, postictal confusion that lasts approximately 1 hour, occasional incontinence, and occasional tongue biting.  She and her boyfriend state that her seizures have not at all been controlled by the Keppra medication and they almost feel as though her frequency of seizures are increasing.  I asked her why she has not been followed by neurology and she states that she simply gets her seizure medication from the ER.  She states that she is unable to see them due to her being placed under legal guardianship by her mother who passed away 4 years ago.  She also does not have any primary care follow-up.  She is endorsing a headache due to her fall and state that her teeth are sore from biting down hard, but she denies any continued confusion, fevers or chills, recent illness, chest pain or difficulty  breathing, abdominal pain, nausea vomiting, or other symptoms.  HPI     Past Medical History:  Diagnosis Date  . ADHD (attention deficit hyperactivity disorder)   . Anxiety   . Depression   . Headache(784.0)   . Obesity   . Seizures (HCC)   . Seizures (HCC) 2006    Patient Active Problem List   Diagnosis Date Noted  . Seizure (HCC) 02/27/2018  . Opioid use disorder, severe, dependence (HCC) 12/10/2015  . Suicidal ideation   . 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 Attention-deficit hyperactivity disorder, 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  . Oppositional defiant disorder 06/28/2011    Past Surgical History:  Procedure Laterality Date  . ADENOIDECTOMY    . ANKLE SURGERY    . ANKLE SURGERY  at 24yo   Pt. reports to lengthen her tendons  . TONSILLECTOMY       OB History    Gravida  2   Para      Term      Preterm      AB  1   Living        SAB      TAB      Ectopic      Multiple      Live Births              Family History  Problem Relation Age of Onset  . Bipolar disorder Mother   . Cancer Mother   .  Diabetes Mother   . Hypertension Mother   . Hypertension Father   . Stroke Father     Social History   Tobacco Use  . Smoking status: Current Some Day Smoker    Packs/day: 0.10    Types: Cigarettes    Last attempt to quit: 01/13/2016    Years since quitting: 3.5  . Smokeless tobacco: Never Used  Substance Use Topics  . Alcohol use: No  . Drug use: No    Home Medications Prior to Admission medications   Medication Sig Start Date End Date Taking? Authorizing Provider  clomiPHENE (CLOMID) 50 MG tablet Take 1 tablet (50 mg total) by mouth daily. Expect ovulation 7 days after last pill 08/18/18  Yes Tilda Burrow, MD  levETIRAcetam (KEPPRA) 500 MG tablet Take 500 mg by mouth 2 (two) times daily. 04/26/18  Yes [provider]  loperamide (IMODIUM) 2 MG capsule Take 1 capsule (2 mg total)  by mouth 4 (four) times daily as needed for diarrhea or loose stools. 07/11/19  Yes Idol, Raynelle Fanning, PA-C  desogestrel-ethinyl estradiol (APRI,EMOQUETTE,SOLIA) 0.15-30 MG-MCG tablet Take 1 tablet by mouth daily. Patient not taking: Reported on 07/27/2018 01/19/18   Lazaro Arms, MD    Allergies    Peanut-containing drug products, Latex, Soy allergy, and Tomato  Review of Systems   Review of Systems  All other systems reviewed and are negative.   Physical Exam Updated Vital Signs BP 125/67   Pulse 78   Temp 98.6 F (37 C) (Oral)   Resp 20   Ht 5\' 7"  (1.702 m)   Wt 107 kg   SpO2 100%   BMI 36.95 kg/m   Physical Exam Vitals and nursing note reviewed. Exam conducted with a chaperone present.  Constitutional:      Appearance: Normal appearance.  HENT:     Head: Normocephalic and atraumatic.     Mouth/Throat:     Comments: Patent oropharynx.  No dental abnormalities.  No tongue biting.  No evidence of bleeding.  Tolerating secretions well. Eyes:     General: No scleral icterus.    Conjunctiva/sclera: Conjunctivae normal.  Cardiovascular:     Rate and Rhythm: Normal rate and regular rhythm.     Pulses: Normal pulses.     Heart sounds: Normal heart sounds.  Pulmonary:     Effort: Pulmonary effort is normal. No respiratory distress.     Breath sounds: Normal breath sounds. No wheezing.  Musculoskeletal:     Cervical back: Normal range of motion. No rigidity.  Skin:    General: Skin is dry.  Neurological:     Mental Status: She is alert.     GCS: GCS eye subscore is 4. GCS verbal subscore is 5. GCS motor subscore is 6.     Comments: CN II through XII grossly intact.  Negative Romberg and cerebellar exams.  ROM is intact throughout.  A&O x3.  PERRL and EOM intact.  No nystagmus.  Ambulates without ataxia.  Psychiatric:        Mood and Affect: Mood normal.        Behavior: Behavior normal.        Thought Content: Thought content normal.     ED Results / Procedures /  Treatments   Labs (all labs ordered are listed, but only abnormal results are displayed) Labs Reviewed  CBC WITH DIFFERENTIAL/PLATELET - Abnormal; Notable for the following components:      Result Value   RBC 5.44 (*)  Hemoglobin 16.5 (*)    HCT 49.4 (*)    All other components within normal limits  BASIC METABOLIC PANEL - Abnormal; Notable for the following components:   BUN 21 (*)    All other components within normal limits  URINALYSIS, ROUTINE W REFLEX MICROSCOPIC - Abnormal; Notable for the following components:   APPearance HAZY (*)    Hgb urine dipstick LARGE (*)    Ketones, ur 5 (*)    Bacteria, UA RARE (*)    All other components within normal limits  RAPID URINE DRUG SCREEN, HOSP PERFORMED - Abnormal; Notable for the following components:   Tetrahydrocannabinol POSITIVE (*)    All other components within normal limits  LEVETIRACETAM LEVEL  I-STAT BETA HCG BLOOD, ED ([facility], [facility], [facility] ONLY)    EKG EKG Interpretation  Date/Time:  Friday July 30 2019 14:04:41 EDT Ventricular Rate:  68 PR Interval:  124 QRS Duration: 80 QT Interval:  384 QTC Calculation: 408 R Axis:   92 Text Interpretation: Normal sinus rhythm with sinus arrhythmia Rightward axis Borderline ECG Confirmed by Noemi Chapel [phone]) on 07/30/2019 7:00:00 PM   Radiology CT Head Wo Contrast  Result Date: 07/30/2019 CLINICAL DATA:  24 year old female with seizure. EXAM: CT HEAD WITHOUT CONTRAST TECHNIQUE: Contiguous axial images were obtained from the base of the skull through the vertex without intravenous contrast. COMPARISON:  Head CT dated 02/27/2018. FINDINGS: Brain: No evidence of acute infarction, hemorrhage, hydrocephalus, extra-axial collection or mass lesion/mass effect. Vascular: No hyperdense vessel or unexpected calcification. Skull: Normal. Negative for fracture or focal lesion. Sinuses/Orbits: No acute finding. Other: None IMPRESSION: Normal unenhanced CT of the brain. Electronically Signed   By:  Anner Crete M.D.   On: 07/30/2019 16:29    Procedures Procedures (including critical care time)  Medications Ordered in ED Medications  levETIRAcetam (KEPPRA) tablet 750 mg (has no administration in time range)  levETIRAcetam (KEPPRA) IVPB 1000 mg/100 mL premix (0 mg Intravenous Stopped 07/30/19 1907)  acetaminophen (TYLENOL) tablet 650 mg (650 mg Oral Given 07/30/19 1641)    ED Course  I have reviewed the triage vital signs and the nursing notes.  Pertinent labs & imaging results that were available during my care of the patient were reviewed by me and considered in my medical decision making (see chart for details).  Clinical Course as of Jul 30 1938  Fri Jul 30, 2019  1751 Pt seen by myself as well as PA provider.  Briefly this is a 24 yo female with a history of seizures, reportedly on Keppra, presenting to the ED with persistent seizure episodes including today.  She reports 16 episodes in the past month alone.  She unfortunately has only been managed through the ED, with neurology consultation in the past, and has been started on Keppra in the ED and maintained on scripts since then.  She has a difficult social-legal situation where she reports her now-deceased mother was her legal guardian due to juvenile deliquency issues, but her mother has been deceased for 4 years and the patient is unable to get her medicaid to approve of outpatient consultation or imaging.  She states she cannot afford to see a specialist.  We'll ask our transition of care team to look into this issue, and we'll talk to neurology while she is here.  It's not clear if she is truly compliant with her Keppra, as her last script was written several months ago, and she claims she has been taking it daily.  There is  no other clear inciting factor for her seizures per her workup today (ie no infection, no brain mass, no sig drug use aside from [facility]).  Plan to discuss the case and social situation with neurologist, and  defer to their recommendations regarding inpatient evaluation vs secondary medications   [MT]  1823 Spoke with the teleneurologist nurse manager, Judeth Cornfield, who have neurologist speak with patient in the room with anticipation of admission for medication stabilization and observation.   [GG]  [id number] Spoke with Dr. Karolee Ohs who evaluated patient and recommends admission for observation and EEG.  He is also advising that she receive 750 mg Keppra twice daily rather than the reported 500 mg Keppra twice daily.    [GG]    Clinical Course User Index [GG] Lorelee New, PA-C [MT] Terald Sleeper, MD   MDM Rules/Calculators/A&P                      Patient had been evaluated for seizures and admitted to the hospital at the end of 2019.  The recommendation at that time of discharge was EEG and MRI in addition to outpatient follow-up with Dr. Gerilyn Pilgrim.  Spoke with Aundra Millet, Visual merchandiser, who will speak with patient about arranging for insurance and steps needed to help her prepare for outpatient neurology and primary care follow-up.  Spoke with Dr. Karolee Ohs who evaluated patient and recommends admission for observation and EEG.  He is also advising that she receive 750 mg Keppra twice daily rather than the reported 500 mg Keppra twice daily.   Will consult hospitalist for admission.  Discussed case with both Dr. Renaye Rakers and Dr. Hyacinth Meeker who agree with assessment and plan.  Spoke with Dr. Adrian Blackwater, hospitalist here at [facility], and evidently EEGs cannot be performed over the weekend at this hospital.  He informs me that the [facility] neuro hospitalist will need to be made aware of patient prior to admission and transfer.  Spoke with Dr. Otelia Limes who will see patient once she is admitted.    Final Clinical Impression(s) / ED Diagnoses Final diagnoses:  Seizure disorder [facility])    Rx / DC Orders ED Discharge Orders    None       Lorelee New, PA-C 07/30/19 1940    Terald Sleeper,  MD 08/06/19 [phone]

## 2019-07-30 NOTE — ED Notes (Signed)
Pt was informed that we need a urine sample. 

## 2019-07-30 NOTE — Consult Note (Signed)
Consult Note  Pamela Mccarty WUJ:[id number] DOB: [birth year] DOA: 07/30/2019  Referring physician: Evelena Leyden, PA-C , ED physician PCP: Patient, No Pcp Per  Outpatient Specialists:   Patient Coming From: home  Chief Complaint: seizures  HPI: Pamela Mccarty is a 24 y.o. female with a history of ADHD, obesity, seizure disorder.  Patient reports having 16 seizures over the past month with a seizure occurring approximately every day.  She is on Keppra 500 mg twice daily and reports not missing a dose.  She describes both tonic-clonic/grand mal seizures in addition to focal seizures.  She says that the focal seizures will lead into grand mal seizures.  Earlier today, the patient was seen staring off to space and was unresponsive to tactile and verbal stimulation.  Patient then fell and hit her head.  EMS was called and the patient was brought to the hospital.  Emergency Department Course: Work-up relatively normal.  Teleneurology consulted who recommended admission with an EEG.  Review of Systems:   Pt denies any fevers, chills, nausea, vomiting, diarrhea, constipation, abdominal pain, shortness of breath, dyspnea on exertion, orthopnea, cough, wheezing, palpitations, headache, vision changes, lightheadedness, dizziness, melena, rectal bleeding.  Review of systems are otherwise negative  Past Medical History:  Diagnosis Date  . ADHD (attention deficit hyperactivity disorder)   . Anxiety   . Depression   . Headache(784.0)   . Obesity   . Seizures (HCC)   . Seizures (HCC) 2006   Past Surgical History:  Procedure Laterality Date  . ADENOIDECTOMY    . ANKLE SURGERY    . ANKLE SURGERY  at 24yo   Pt. reports to lengthen her tendons  . TONSILLECTOMY     Social History:  reports that she has been smoking cigarettes. She has been smoking about 0.10 packs per day. She has never used smokeless tobacco. She reports that she does not drink alcohol or use drugs. Patient lives at  home  Allergies  Allergen Reactions  . Peanut-Containing Drug Products Anaphylaxis    Stomach bubbles with Peanut oil only  (Patient states that she is not allergic to this)  . Latex   . Soy Allergy     hives  . Tomato     Throat swelling, shortness of breath     Family History  Problem Relation Age of Onset  . Bipolar disorder Mother   . Cancer Mother   . Diabetes Mother   . Hypertension Mother   . Hypertension Father   . Stroke Father       Prior to Admission medications   Medication Sig Start Date End Date Taking? Authorizing Provider  clomiPHENE (CLOMID) 50 MG tablet Take 1 tablet (50 mg total) by mouth daily. Expect ovulation 7 days after last pill 08/18/18  Yes Tilda Burrow, MD  levETIRAcetam (KEPPRA) 500 MG tablet Take 500 mg by mouth 2 (two) times daily. 04/26/18  Yes [provider]  loperamide (IMODIUM) 2 MG capsule Take 1 capsule (2 mg total) by mouth 4 (four) times daily as needed for diarrhea or loose stools. 07/11/19  Yes Idol, Raynelle Fanning, PA-C  desogestrel-ethinyl estradiol (APRI,EMOQUETTE,SOLIA) 0.15-30 MG-MCG tablet Take 1 tablet by mouth daily. Patient not taking: Reported on 07/27/2018 01/19/18   Lazaro Arms, MD    Physical Exam: BP 105/63   Pulse 82   Temp 98.6 F (37 C) (Oral)   Resp (!) 23   Ht 5\' 7"  (1.702 m)   Wt 107 kg   SpO2 96%  BMI 36.95 kg/m   . General: Young female. Awake and alert and oriented x3. No acute cardiopulmonary distress.  Marland Kitchen HEENT: Normocephalic atraumatic.  Right and left ears normal in appearance.  Pupils equal, round, reactive to light. Extraocular muscles are intact. Sclerae anicteric and noninjected.  Moist mucosal membranes. No mucosal lesions.  . Neck: Neck supple without lymphadenopathy. No carotid bruits. No masses palpated.  . Cardiovascular: Regular rate with normal S1-S2 sounds. No murmurs, rubs, gallops auscultated. No JVD.  Marland Kitchen Respiratory: Good respiratory effort with no wheezes, rales, rhonchi. Lungs  clear to auscultation bilaterally.  No accessory muscle use. . Abdomen: Soft, nontender, nondistended. Active bowel sounds. No masses or hepatosplenomegaly  . Skin: No rashes, lesions, or ulcerations.  Dry, warm to touch. 2+ dorsalis pedis and radial pulses. . Musculoskeletal: No calf or leg pain. All major joints not erythematous nontender.  No upper or lower joint deformation.  Good ROM.  No contractures  . Psychiatric: Intact judgment and insight. Pleasant and cooperative. . Neurologic: No focal neurological deficits. Strength is 5/5 and symmetric in upper and lower extremities.  Cranial nerves II through XII are grossly intact.           Labs on Admission: I have personally reviewed following labs and imaging studies  CBC: Recent Labs  Lab 07/30/19 1421  WBC 7.5  NEUTROABS 4.5  HGB 16.5*  HCT 49.4*  MCV 90.8  PLT 409   Basic Metabolic Panel: Recent Labs  Lab 07/30/19 1421  NA 137  K 3.6  CL 104  CO2 24  GLUCOSE 77  BUN 21*  CREATININE 0.84  CALCIUM 9.0   GFR: Estimated Creatinine Clearance: 133.5 mL/min (by C-G formula based on SCr of 0.84 mg/dL). Liver Function Tests: No results for input(s): AST, ALT, ALKPHOS, BILITOT, PROT, ALBUMIN in the last 168 hours. No results for input(s): LIPASE, AMYLASE in the last 168 hours. No results for input(s): AMMONIA in the last 168 hours. Coagulation Profile: No results for input(s): INR, PROTIME in the last 168 hours. Cardiac Enzymes: No results for input(s): CKTOTAL, CKMB, CKMBINDEX, TROPONINI in the last 168 hours. BNP (last 3 results) No results for input(s): PROBNP in the last 8760 hours. HbA1C: No results for input(s): HGBA1C in the last 72 hours. CBG: No results for input(s): GLUCAP in the last 168 hours. Lipid Profile: No results for input(s): CHOL, HDL, LDLCALC, TRIG, CHOLHDL, LDLDIRECT in the last 72 hours. Thyroid Function Tests: No results for input(s): TSH, T4TOTAL, FREET4, T3FREE, THYROIDAB in the last 72  hours. Anemia Panel: No results for input(s): VITAMINB12, FOLATE, FERRITIN, TIBC, IRON, RETICCTPCT in the last 72 hours. Urine analysis:    Component Value Date/Time   COLORURINE YELLOW 07/30/2019 1528   APPEARANCEUR HAZY (A) 07/30/2019 1528   LABSPEC 1.025 07/30/2019 1528   PHURINE 6.0 07/30/2019 1528   GLUCOSEU NEGATIVE 07/30/2019 1528   HGBUR LARGE (A) 07/30/2019 1528   BILIRUBINUR NEGATIVE 07/30/2019 1528   KETONESUR 5 (A) 07/30/2019 1528   PROTEINUR NEGATIVE 07/30/2019 1528   NITRITE NEGATIVE 07/30/2019 1528   LEUKOCYTESUR NEGATIVE 07/30/2019 1528   Sepsis Labs: @LABRCNTIP (procalcitonin:4,lacticidven:4) )No results found for this or any previous visit (from the past 240 hour(s)).   Radiological Exams on Admission: CT Head Wo Contrast  Result Date: 07/30/2019 CLINICAL DATA:  24 year old female with seizure. EXAM: CT HEAD WITHOUT CONTRAST TECHNIQUE: Contiguous axial images were obtained from the base of the skull through the vertex without intravenous contrast. COMPARISON:  Head CT dated 02/27/2018. FINDINGS: Brain:  No evidence of acute infarction, hemorrhage, hydrocephalus, extra-axial collection or mass lesion/mass effect. Vascular: No hyperdense vessel or unexpected calcification. Skull: Normal. Negative for fracture or focal lesion. Sinuses/Orbits: No acute finding. Other: None IMPRESSION: Normal unenhanced CT of the brain. Electronically Signed   By: Elgie Collard M.D.   On: 07/30/2019 16:29    Assessment/Plan: Active Problems:   Seizure [facility])    This patient was discussed with the ED physician, including pertinent vitals, physical exam findings, labs, and imaging.  We also discussed care given by the ED provider.  1. Seizures a. Patient loaded with Keppra b. Recommended admission to [facility] for EEG as we do not have neurology appear over the weekend and no capability of performing EEGs. c. Patient is resistant to being admitted to [facility].   Initially, on my conversations with the patient's, patient was willing to go down to [facility].  However, after I left, the patient signed out AMA and left the hospital prior to me being able to go down and see her. d. Obviously the patient needs outpatient follow-up, which she does not currently have.    Levie Heritage, DO

## 2019-07-30 NOTE — Clinical Social Work Note (Signed)
TOC received consult for patient needing assistance with insurance issues, PCP needs, and needing proof of death for her mother that was her legal guardian as patient no longer has a death certificate.  CSW met with patient and provided her with [employer] application and the phone number for [place] to transfer her Medicaid from [facility]. Patient reported she has attempted to transfer her Medicaid to [facility], but was told she needs a legal guardian to make the transfer. CSW provided patient with death certificate application so patient can obtain proof of death so she can get a new legal guardian.   CSW printed off legal guardian paperwork and explained to patient the forms will need to be filed in court as a court determines one's legal guardian. CSW called [employer] to make PCP referral. TOC to follow if patient is admitted.  Tobi Bastos, LCSW Transitions of Care Clinical Social Worker [facility] Ph: [phone]

## 2019-07-30 NOTE — ED Notes (Signed)
Admitting MD at bedside.

## 2019-07-30 NOTE — ED Notes (Signed)
Pt advised the risks of leaving AMA, pt states that she doesn't care and she is going home. Pt ambulatory to lobby with no difficulty or distress noted.

## 2019-07-30 NOTE — Consult Note (Signed)
[facility] TeleNeurology Consult Services  Stat Consult  Date of Service:   07/30/2019 18:15:41  Impression:     .  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 I would suggest giving her a bolus of 1 g of Keppra IV and then starting her on 750 every 12 hours. I would suggest considering an EEG and if possible keeping her in observation unit overnight to stabilize the seizures, have neurology follow her in the morning. Patient was told that she should not be driving as per her local laws and has to follow with a neurologist before being cleared for driving.  Metrics: [facility] Notification Time: 07/30/2019 18:12:24 Stamp Time: 07/30/2019 18:15:41 Callback Response Time: 07/30/2019 18:16:54  Our recommendations are outlined below.  Recommendations:     .  Load With Keppra 1000 mg Now     .  start Keppra 750 mg Bid    Disposition: Neurology Follow Up Recommended  Sign Out:     .  Discussed with Emergency Department Provider  ----------------------------------------------------------------------------------------------------  Chief Complaint: Seizures  History of Present Illness: Patient is a 24 year old Female.  24 year old female with past medical history of seizures came to the hospital because of a seizure. She reports she started having seizures when she was 24 years old. She has been on Keppra and reports that this is the only medicine she has taken. She reports that she had 3 seizures in the past 12 hours and came to the hospital. She does not go to a doctor and has not seen a neurologist. Her family thinks the seizures are triggered by stress. Patient denies any other issues. Denies any focal weakness. Denies any issues with her speech or swallowing. Denies any side effects with Keppra.    Examination: BP(143/103), Pulse(90), Blood Glucose(86)  Neuro Exam:  General: Alert,Awake, Oriented to Time, Place, Person  Speech: Fluent:  Language: Intact:  Face: Symmetric:  Facial Sensation: Intact:  Visual Fields: Intact:  Extraocular Movements: Intact:  Motor Exam: No Drift:  Sensation: Intact:  Coordination: Intact:    Patient/Family was informed the Neurology Consult would occur via TeleHealth consult by way of interactive audio and video telecommunications and consented to receiving care in this manner.  Due to the immediate potential for life-threatening deterioration due to underlying acute neurologic illness, I spent 30 minutes providing critical care. This time includes time for face to face visit via telemedicine, review of medical records, imaging studies and discussion of findings with providers, the patient and/or family.   Dr Hedy Camara   [facility] [phone]  Case [id number]

## 2019-07-30 NOTE — ED Triage Notes (Signed)
Patient comes to the ED after an approximately two minute witnessed seizure while at work today.  Patient states co worker observed her falling forward hitting head onto counter and then the wall. Patient states she has history of seizures and takes keppra.

## 2019-08-05 LAB — LEVETIRACETAM LEVEL: Levetiracetam Lvl: <1 ug/mL — ABNORMAL LOW (ref 10.0–40.0)

## 2019-08-30 ENCOUNTER — Encounter (HOSPITAL_COMMUNITY): Payer: Self-pay | Admitting: *Deleted

## 2019-08-30 ENCOUNTER — Emergency Department (HOSPITAL_COMMUNITY)
Admission: EM | Admit: 2019-08-30 | Discharge: 2019-08-30 | Disposition: A | Discharge status: Home / Self Care | Payer: Medicaid Other | Attending: Emergency Medicine | Admitting: Emergency Medicine

## 2019-08-30 ENCOUNTER — Other Ambulatory Visit: Payer: Self-pay

## 2019-08-30 DIAGNOSIS — Z79899 Other long term (current) drug therapy: Secondary | ICD-10-CM | POA: Diagnosis not present

## 2019-08-30 DIAGNOSIS — Z87891 Personal history of nicotine dependence: Secondary | ICD-10-CM | POA: Insufficient documentation

## 2019-08-30 DIAGNOSIS — Z91040 Latex allergy status: Secondary | ICD-10-CM | POA: Insufficient documentation

## 2019-08-30 DIAGNOSIS — R6883 Chills (without fever): Secondary | ICD-10-CM | POA: Diagnosis not present

## 2019-08-30 DIAGNOSIS — K0889 Other specified disorders of teeth and supporting structures: Secondary | ICD-10-CM | POA: Diagnosis present

## 2019-08-30 DIAGNOSIS — Z91010 Allergy to peanuts: Secondary | ICD-10-CM | POA: Diagnosis not present

## 2019-08-30 DIAGNOSIS — F1210 Cannabis abuse, uncomplicated: Secondary | ICD-10-CM | POA: Diagnosis not present

## 2019-08-30 MED ORDER — HYDROCODONE-ACETAMINOPHEN 5-325 MG PO TABS
1.0000 | ORAL_TABLET | Freq: Once | ORAL | Status: AC
  Administered 2019-08-30: 1 via ORAL
  Filled 2019-08-30: qty 1

## 2019-08-30 MED ORDER — PENICILLIN V POTASSIUM 500 MG PO TABS
500.0000 mg | ORAL_TABLET | Freq: Four times a day (QID) | ORAL | 0 refills | Status: DC
Start: 2019-08-30 — End: 2019-08-30

## 2019-08-30 MED ORDER — LIDOCAINE VISCOUS HCL 2 % MT SOLN
15.0000 mL | OROMUCOSAL | 0 refills | Status: DC | PRN
Start: 2019-08-30 — End: 2019-08-30

## 2019-08-30 MED ORDER — NAPROXEN 500 MG PO TABS
500.0000 mg | ORAL_TABLET | Freq: Two times a day (BID) | ORAL | 0 refills | Status: DC
Start: 2019-08-30 — End: 2020-03-14

## 2019-08-30 MED ORDER — NAPROXEN 500 MG PO TABS
500.0000 mg | ORAL_TABLET | Freq: Two times a day (BID) | ORAL | 0 refills | Status: DC
Start: 2019-08-30 — End: 2019-08-30

## 2019-08-30 MED ORDER — PENICILLIN V POTASSIUM 250 MG PO TABS
500.0000 mg | ORAL_TABLET | Freq: Once | ORAL | Status: AC
  Administered 2019-08-30: 500 mg via ORAL
  Filled 2019-08-30: qty 2

## 2019-08-30 MED ORDER — PENICILLIN V POTASSIUM 500 MG PO TABS
500.0000 mg | ORAL_TABLET | Freq: Four times a day (QID) | ORAL | 0 refills | Status: AC
Start: 2019-08-30 — End: 2019-09-06

## 2019-08-30 MED ORDER — LIDOCAINE VISCOUS HCL 2 % MT SOLN: 15.0000 mL | OROMUCOSAL | 0 refills | Status: DC | PRN

## 2019-08-30 NOTE — ED Triage Notes (Signed)
Pt c/o lower dental pain x 3.5 weeks. Pt has dentist appt but not until June 21.

## 2019-08-30 NOTE — ED Notes (Signed)
ED Provider at bedside. 

## 2019-08-30 NOTE — Discharge Instructions (Signed)
Please take all of your antibiotics until finished!   Take your antibiotics with food.  Common side effects of antibiotics include nausea, vomiting, abdominal discomfort, and diarrhea. You may help offset some of this with probiotics which you can buy or get in yogurt. Do not eat  or take the probiotics until 2 hours after your antibiotic.    Some studies suggest that certain antibiotics can reduce the efficacy of certain oral contraceptive pills (birth control), so please use additional contraceptives (condoms or other barrier method) while you are taking the antibiotics and for an additional 5 to 7 days afterwards if you are a female on these medications.  Apply warm or cool compresses to jaw throughout the day, whichever feels best.  Take naproxen twice daily with meals other anti-inflammatory such as ibuprofen, Advil, Aleve, or Motrin while taking this.  You may take 1 to 2 tablets of extra strength Tylenol every 6 hours as needed for pain additionally.  Do not exceed 4000 mg of Tylenol daily.  You may also use warm water salt gargles, Orajel, or other over-the-counter dental pain remedies. Use lidocaine swish and spit (do not swallow) as needed for dental pain.   Followup with a dentist is very important for ongoing evaluation and management of recurrent dental pain.  I have given you the information for the dentist on-call today as well as resources for other dentists in the area that are more affordable.  Return to emergency department for emergent changing or worsening symptoms such as fever, worsening facial swelling, difficulty breathing or swallowing, throat tightness, or vision changes.

## 2019-08-30 NOTE — ED Provider Notes (Signed)
[facility] EMERGENCY DEPARTMENT Provider Note   CSN: [id number] Arrival date & time: 08/30/19  1245     History Chief Complaint  Patient presents with  . Dental Pain    Pamela Mccarty is a 24 y.o. female with history of ADHD, anxiety, depression, headaches, seizures, oppositional defiant disorder presents for evaluation of acute onset, persistent mandibular dental pain for 3.5 weeks.  Reports pain is experienced daily, throbbing, worsens with eating and exposure to hot foods, cold foods, ear.  It radiates up both sides of her jaw up to her skull and states that it makes her chronic headaches worse.  Denies difficulty breathing or swallowing.  Has felt chills but is afebrile here.  Has been taking ibuprofen, Tylenol with little relief.  She has an appointment to see a dentist but not until June 21.  She is requesting antibiotics.  The history is provided by the patient.       Past Medical History:  Diagnosis Date  . ADHD (attention deficit hyperactivity disorder)   . Anxiety   . Depression   . Headache(784.0)   . Obesity   . Seizures ([place])    last seizure 08/29/19  . Seizures [facility])     Patient Active Problem List   Diagnosis Date Noted  . Seizure ([place]) 02/27/2018  . Opioid use disorder, severe, dependence ([place]) 12/10/2015  . Suicidal ideation   . MDD (major depressive disorder), recurrent severe, without psychosis ([place]) 08/15/2014  . Attention-deficit hyperactivity disorder, 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  . Oppositional defiant disorder 06/28/2011    Past Surgical History:  Procedure Laterality Date  . ADENOIDECTOMY    . ANKLE SURGERY    . ANKLE SURGERY  at 24yo   Pt. reports to lengthen her tendons  . TONSILLECTOMY       OB History    Gravida  2   Para      Term      Preterm      AB  1   Living        SAB      TAB      Ectopic      Multiple      Live Births              Family History  Problem Relation Age of Onset  . Bipolar disorder  Mother   . Cancer Mother   . Diabetes Mother   . Hypertension Mother   . Hypertension Father   . Stroke Father     Social History   Tobacco Use  . Smoking status: Former Smoker    Packs/day: 0.10    Types: Cigarettes    Quit date: 01/13/2016    Years since quitting: 3.6  . Smokeless tobacco: Never Used  Substance Use Topics  . Alcohol use: No  . Drug use: Yes    Types: Marijuana    Home Medications Prior to Admission medications   Medication Sig Start Date End Date Taking? Authorizing Provider  clomiPHENE (CLOMID) 50 MG tablet Take 1 tablet (50 mg total) by mouth daily. Expect ovulation 7 days after last pill 08/18/18   Jonnie Kind, MD  desogestrel-ethinyl estradiol (APRI,EMOQUETTE,SOLIA) 0.15-30 MG-MCG tablet Take 1 tablet by mouth daily. Patient not taking: Reported on 07/27/2018 01/19/18   Florian Buff, MD  levETIRAcetam (KEPPRA) 500 MG tablet Take 500 mg by mouth 2 (two) times daily. 04/26/18   [provider]  lidocaine (XYLOCAINE) 2 % solution Use as directed 15 mLs in  the mouth or throat as needed for mouth pain. 08/30/19   Michela Pitcher A, PA-C  loperamide (IMODIUM) 2 MG capsule Take 1 capsule (2 mg total) by mouth 4 (four) times daily as needed for diarrhea or loose stools. 07/11/19   Burgess Amor, PA-C  naproxen (NAPROSYN) 500 MG tablet Take 1 tablet (500 mg total) by mouth 2 (two) times daily with a meal. 08/30/19   Monifa Blanchette A, PA-C  penicillin v potassium (VEETID) 500 MG tablet Take 1 tablet (500 mg total) by mouth 4 (four) times daily for 7 days. 08/30/19 09/06/19  Michela Pitcher A, PA-C    Allergies    Peanut-containing drug products, Latex, Soy allergy, and Tomato  Review of Systems   Review of Systems  Constitutional: Positive for chills. Negative for fever.  HENT: Positive for dental problem. Negative for trouble swallowing.   Respiratory: Negative for shortness of breath.   Neurological: Positive for headaches.    Physical Exam Updated Vital  Signs BP 136/82 (BP Location: Right Arm)   Pulse 64   Temp 98 F (36.7 C) (Oral)   Resp 16   Ht 5\' 6"  (1.676 m)   Wt 83.5 kg   LMP  (Within Months)   SpO2 99%   BMI 29.70 kg/m   Physical Exam Vitals and nursing note reviewed.  Constitutional:      General: She is not in acute distress.    Appearance: She is well-developed.  HENT:     Head: Normocephalic and atraumatic.     Mouth/Throat:     Comments: Moderately diffusely decaying dentition.  Tenderness to percussion overlying the left and right mandibular molars.  Mild gingival irritation but no fluctuance or drainage.  No trismus.  Tolerating secretions without difficulty.  No sublingual or subglossal abnormalities.  No tenderness to palpation of the submental region Eyes:     General:        Right eye: No discharge.        Left eye: No discharge.     Conjunctiva/sclera: Conjunctivae normal.  Neck:     Vascular: No JVD.     Trachea: No tracheal deviation.  Cardiovascular:     Rate and Rhythm: Normal rate and regular rhythm.  Pulmonary:     Effort: Pulmonary effort is normal.  Abdominal:     General: There is no distension.  Musculoskeletal:     Cervical back: Neck supple. No rigidity.  Skin:    General: Skin is warm and dry.     Findings: No erythema.  Neurological:     Mental Status: She is alert.  Psychiatric:        Behavior: Behavior normal.     ED Results / Procedures / Treatments   Labs (all labs ordered are listed, but only abnormal results are displayed) Labs Reviewed - No data to display  EKG None  Radiology No results found.  Procedures Procedures (including critical care time)  Medications Ordered in ED Medications  HYDROcodone-acetaminophen (NORCO/VICODIN) 5-325 MG per tablet 1 tablet (has no administration in time range)  penicillin v potassium (VEETID) tablet 500 mg (has no administration in time range)    ED Course  I have reviewed the triage vital signs and the nursing  notes.  Pertinent labs & imaging results that were available during my care of the patient were reviewed by me and considered in my medical decision making (see chart for details).    MDM Rules/Calculators/A&P  Patient with toothache.  No gross abscess amenable to I&D.  Patient is afebrile, vital signs are stable.  Patient is nontoxic in appearance and is tolerating secretions without difficulty.  Exam unconcerning for Ludwig's angina, peritonsillar abscess, strep pharyngitis, meningitis, intracranial hemorrhage, deep space neck infection or spread of infection.  Will treat with antibiotics, viscous lidocaine, anti-inflammatories.  Recommend follow-up with dentist on an outpatient basis and will provide resources for follow-up.  Discussed strict ED return precautions. Patient and significant other at the bedside verbalized understanding of and agreement with plan and patient is safe for discharge home at this time.   Final Clinical Impression(s) / ED Diagnoses Final diagnoses:  Pain, dental    Rx / DC Orders ED Discharge Orders         Ordered    naproxen (NAPROSYN) 500 MG tablet  2 times daily with meals     08/30/19 1439    penicillin v potassium (VEETID) 500 MG tablet  4 times daily     08/30/19 1439    lidocaine (XYLOCAINE) 2 % solution  As needed     08/30/19 1439           Jeanie Sewer, PA-C 08/30/19 1443    Mancel Bale, MD 08/31/19 1859

## 2019-11-02 ENCOUNTER — Encounter (HOSPITAL_COMMUNITY): Payer: Self-pay

## 2019-11-02 ENCOUNTER — Other Ambulatory Visit: Payer: Self-pay

## 2019-11-02 ENCOUNTER — Emergency Department (HOSPITAL_COMMUNITY)
Admission: EM | Admit: 2019-11-02 | Discharge: 2019-11-02 | Disposition: A | Discharge status: Home / Self Care | Payer: Medicaid Other | Attending: Emergency Medicine | Admitting: Emergency Medicine

## 2019-11-02 DIAGNOSIS — H9209 Otalgia, unspecified ear: Secondary | ICD-10-CM | POA: Diagnosis not present

## 2019-11-02 DIAGNOSIS — R112 Nausea with vomiting, unspecified: Secondary | ICD-10-CM | POA: Insufficient documentation

## 2019-11-02 DIAGNOSIS — Z79899 Other long term (current) drug therapy: Secondary | ICD-10-CM | POA: Diagnosis not present

## 2019-11-02 DIAGNOSIS — R569 Unspecified convulsions: Secondary | ICD-10-CM | POA: Diagnosis not present

## 2019-11-02 DIAGNOSIS — K0889 Other specified disorders of teeth and supporting structures: Secondary | ICD-10-CM | POA: Diagnosis not present

## 2019-11-02 DIAGNOSIS — Z87891 Personal history of nicotine dependence: Secondary | ICD-10-CM | POA: Insufficient documentation

## 2019-11-02 DIAGNOSIS — R6884 Jaw pain: Secondary | ICD-10-CM | POA: Diagnosis present

## 2019-11-02 LAB — CBC WITH DIFFERENTIAL/PLATELET
Abs Immature Granulocytes: 0.01 10*3/uL (ref 0.00–0.07)
Basophils Absolute: 0 10*3/uL (ref 0.0–0.1)
Basophils Relative: 0 %
Eosinophils Absolute: 0.1 10*3/uL (ref 0.0–0.5)
Eosinophils Relative: 1 %
HCT: 48.2 % — ABNORMAL HIGH (ref 36.0–46.0)
Hemoglobin: 15.8 g/dL — ABNORMAL HIGH (ref 12.0–15.0)
Immature Granulocytes: 0 %
Lymphocytes Relative: 26 %
Lymphs Abs: 1.9 10*3/uL (ref 0.7–4.0)
MCH: 30 pg (ref 26.0–34.0)
MCHC: 32.8 g/dL (ref 30.0–36.0)
MCV: 91.6 fL (ref 80.0–100.0)
Monocytes Absolute: 0.5 10*3/uL (ref 0.1–1.0)
Monocytes Relative: 7 %
Neutro Abs: 4.7 10*3/uL (ref 1.7–7.7)
Neutrophils Relative %: 66 %
Platelets: 260 10*3/uL (ref 150–400)
RBC: 5.26 MIL/uL — ABNORMAL HIGH (ref 3.87–5.11)
RDW: 11.9 % (ref 11.5–15.5)
WBC: 7.2 10*3/uL (ref 4.0–10.5)
nRBC: 0 % (ref 0.0–0.2)

## 2019-11-02 LAB — COMPREHENSIVE METABOLIC PANEL
ALT: 23 U/L (ref 0–44)
AST: 21 U/L (ref 15–41)
Albumin: 4.2 g/dL (ref 3.5–5.0)
Alkaline Phosphatase: 61 U/L (ref 38–126)
Anion gap: 7 (ref 5–15)
BUN: 13 mg/dL (ref 6–20)
CO2: 26 mmol/L (ref 22–32)
Calcium: 9.1 mg/dL (ref 8.9–10.3)
Chloride: 105 mmol/L (ref 98–111)
Creatinine, Ser: 0.8 mg/dL (ref 0.44–1.00)
GFR calc Af Amer: >60 mL/min (ref >60)
GFR calc non Af Amer: >60 mL/min (ref >60)
Glucose, Bld: 83 mg/dL (ref 70–99)
Potassium: 3.9 mmol/L (ref 3.5–5.1)
Sodium: 138 mmol/L (ref 135–145)
Total Bilirubin: 0.5 mg/dL (ref 0.3–1.2)
Total Protein: 7.4 g/dL (ref 6.5–8.1)

## 2019-11-02 LAB — SALICYLATE LEVEL: Salicylate Lvl: <7 mg/dL — ABNORMAL LOW (ref 7.0–30.0)

## 2019-11-02 LAB — I-STAT BETA HCG BLOOD, ED (MC, WL, AP ONLY): I-stat hCG, quantitative: <5 m[IU]/mL (ref <5)

## 2019-11-02 LAB — ACETAMINOPHEN LEVEL: Acetaminophen (Tylenol), Serum: <10 ug/mL — ABNORMAL LOW (ref 10–30)

## 2019-11-02 MED ORDER — OXYCODONE-ACETAMINOPHEN 5-325 MG PO TABS
ORAL_TABLET | ORAL | Status: AC
  Filled 2019-11-02: qty 1

## 2019-11-02 MED ORDER — KETOROLAC TROMETHAMINE 60 MG/2ML IM SOLN
60.0000 mg | Freq: Once | INTRAMUSCULAR | Status: AC
  Administered 2019-11-02: 60 mg via INTRAMUSCULAR
  Filled 2019-11-02: qty 2

## 2019-11-02 MED ORDER — ONDANSETRON HCL 4 MG/2ML IJ SOLN
4.0000 mg | Freq: Once | INTRAMUSCULAR | Status: AC
  Administered 2019-11-02: 4 mg via INTRAMUSCULAR
  Filled 2019-11-02: qty 2

## 2019-11-02 MED ORDER — HYDROCODONE-ACETAMINOPHEN 5-325 MG PO TABS: ORAL_TABLET | ORAL | 0 refills | Status: DC

## 2019-11-02 MED ORDER — ONDANSETRON HCL 4 MG PO TABS: 4.0000 mg | ORAL_TABLET | Freq: Four times a day (QID) | ORAL | 0 refills | Status: DC

## 2019-11-02 MED ORDER — ONDANSETRON 8 MG PO TBDP
8.0000 mg | ORAL_TABLET | Freq: Once | ORAL | Status: DC
  Filled 2019-11-02: qty 1

## 2019-11-02 MED ORDER — PENICILLIN V POTASSIUM 500 MG PO TABS
500.0000 mg | ORAL_TABLET | Freq: Three times a day (TID) | ORAL | 0 refills | Status: DC
Start: 2019-11-02 — End: 2019-11-03

## 2019-11-02 NOTE — ED Provider Notes (Signed)
[facility] EMERGENCY DEPARTMENT Provider Note   CSN: [id number] Arrival date & time: 11/02/19  1404     History Chief Complaint  Patient presents with   Jaw Pain    Pamela Mccarty is a 24 y.o. female.  HPI      Pamela Mccarty is a 24 y.o. female with past medical history of seizures anxiety and ADHD, who presents to the Emergency Department complaining of left-sided jaw pain and ear pain for 3 days. She states her symptoms or gradually worsening. She describes a sharp pain along her left lower jaw and teeth that radiates toward her ear. Pain is constant and worse with chewing. She was seen by her dentist last week and had a dental extraction on the right upper tooth. She states the area on the right side has been healing well. She also reports difficulty eating and drinking fluids due to the pain in her mouth. She reports some nausea and multiple episodes of vomiting since last evening. She has been taking multiple doses of Tylenol and ibuprofen to control her pain. Today, she states that she had two unwitnessed seizures prior to arrival. She states this is typical for her, she takes Keppra 3 times daily but states that the Keppra does not control her seizures. She denies fever, chills, facial swelling, and diarrhea chest pain, shortness of breath. She has an appointment with her dentist on August 9. She does not currently have a PCP or neurologist.    Past Medical History:  Diagnosis Date   ADHD (attention deficit hyperactivity disorder)    Anxiety    Depression    Headache(784.0)    Obesity    Seizures ([facility])    last seizure 08/29/19   Seizures ([facility])     Patient Active Problem List   Diagnosis Date Noted   Seizure ([facility]) 02/27/2018   Opioid use disorder, severe, dependence ([facility]) 12/10/2015   Suicidal ideation    MDD (major depressive disorder), recurrent severe, 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/2014   Attention-deficit hyperactivity disorder, combined type  06/28/2011   Oppositional defiant disorder 06/28/2011    Past Surgical History:  Procedure Laterality Date   ADENOIDECTOMY     ANKLE SURGERY     ANKLE SURGERY  at 24yo   Pt. reports to lengthen her tendons   TONSILLECTOMY       OB History    Gravida  2   Para      Term      Preterm      AB  1   Living        SAB      TAB      Ectopic      Multiple      Live Births              Family History  Problem Relation Age of Onset   Bipolar disorder Mother    Cancer Mother    Diabetes Mother    Hypertension Mother    Hypertension Father    Stroke Father     Social History   Tobacco Use   Smoking status: Former Smoker    Packs/day: 0.10    Types: Cigarettes    Quit date: 01/13/2016    Years since quitting: 3.8   Smokeless tobacco: Never Used  Vaping Use   Vaping Use: Never used  Substance Use Topics   Alcohol use: No   Drug use: Yes    Types: Marijuana    Home Medications Prior to  Admission medications   Medication Sig Start Date End Date Taking? Authorizing Provider  clomiPHENE (CLOMID) 50 MG tablet Take 1 tablet (50 mg total) by mouth daily. Expect ovulation 7 days after last pill 08/18/18   Tilda Burrow, MD  desogestrel-ethinyl estradiol (APRI,EMOQUETTE,SOLIA) 0.15-30 MG-MCG tablet Take 1 tablet by mouth daily. Patient not taking: Reported on 07/27/2018 01/19/18   Lazaro Arms, MD  levETIRAcetam (KEPPRA) 500 MG tablet Take 500 mg by mouth 2 (two) times daily. 04/26/18   [provider]  lidocaine (XYLOCAINE) 2 % solution Use as directed 15 mLs in the mouth or throat as needed for mouth pain. 08/30/19   Michela Pitcher A, PA-C  loperamide (IMODIUM) 2 MG capsule Take 1 capsule (2 mg total) by mouth 4 (four) times daily as needed for diarrhea or loose stools. 07/11/19   Burgess Amor, PA-C  naproxen (NAPROSYN) 500 MG tablet Take 1 tablet (500 mg total) by mouth 2 (two) times daily with a meal. 08/30/19   Fawze, Mina A, PA-C     Allergies    Peanut-containing drug products, Latex, Soy allergy, and Tomato  Review of Systems   Review of Systems  Constitutional: Negative for appetite change and fever.  HENT: Positive for dental problem and ear pain. Negative for congestion, facial swelling, sore throat and trouble swallowing.   Eyes: Negative for pain and visual disturbance.  Respiratory: Negative for chest tightness and shortness of breath.   Cardiovascular: Negative for chest pain.  Gastrointestinal: Positive for nausea and vomiting. Negative for diarrhea.  Genitourinary: Negative for decreased urine volume and dysuria.  Musculoskeletal: Negative for neck pain and neck stiffness.  Skin: Negative for rash.  Neurological: Positive for seizures. Negative for dizziness, facial asymmetry, weakness, light-headedness and headaches.  Hematological: Negative for adenopathy.    Physical Exam Updated Vital Signs BP (!) 143/92 (BP Location: Right Arm)    Pulse 75    Temp 98.5 F (36.9 C) (Oral)    Resp 18    Ht 5\' 5"  (1.651 m)    Wt (!) 104.3 kg    LMP 10/21/2019    SpO2 98%    BMI 38.27 kg/m   Physical Exam Vitals and nursing note reviewed.  Constitutional:      General: She is not in acute distress.    Appearance: Normal appearance. She is not ill-appearing or toxic-appearing.     Comments: Patient well-appearing. Nontoxic. She does not appear postictal.  HENT:     Head:     Comments: Cerumen to both ear canals. TMs intact. No erythema.    Right Ear: Tympanic membrane and ear canal normal.     Left Ear: Tympanic membrane and ear canal normal.     Mouth/Throat:     Mouth: Mucous membranes are moist.     Dentition: Dental tenderness and dental caries present. No gingival swelling or dental abscesses.     Pharynx: Oropharynx is clear. Uvula midline. No pharyngeal swelling, posterior oropharyngeal erythema or uvula swelling.     Comments: Diffuse tenderness to palpation to the left lower molars and premolars.  Left lower third molar is partially erupted and appears impacted. No trismus. Uvula is midline and nonedematous. No facial edema. Eyes:     Extraocular Movements: Extraocular movements intact.     Conjunctiva/sclera: Conjunctivae normal.     Pupils: Pupils are equal, round, and reactive to light.  Cardiovascular:     Rate and Rhythm: Normal rate and regular rhythm.     Pulses: Normal  pulses.  Pulmonary:     Effort: Pulmonary effort is normal.     Breath sounds: Normal breath sounds.  Chest:     Chest wall: No tenderness.  Abdominal:     General: There is no distension.     Palpations: Abdomen is soft.     Tenderness: There is no abdominal tenderness. There is no right CVA tenderness or left CVA tenderness.  Musculoskeletal:        General: Normal range of motion.     Cervical back: Normal range of motion.  Skin:    General: Skin is warm.     Capillary Refill: Capillary refill takes less than 2 seconds.  Neurological:     General: No focal deficit present.     Mental Status: She is alert and oriented to person, place, and time. Mental status is at baseline.     GCS: GCS eye subscore is 4. GCS verbal subscore is 5. GCS motor subscore is 6.     Sensory: Sensation is intact. No sensory deficit.     Motor: Motor function is intact. No weakness.     Coordination: Coordination is intact. Coordination normal.     Comments: CN II through XII intact. Speech clear. Mentating well.     ED Results / Procedures / Treatments   Labs (all labs ordered are listed, but only abnormal results are displayed) Labs Reviewed  COMPREHENSIVE METABOLIC PANEL  SALICYLATE LEVEL  CBC WITH DIFFERENTIAL/PLATELET  ACETAMINOPHEN LEVEL  I-STAT BETA HCG BLOOD, ED ([facility], [facility], [facility] ONLY)    EKG None  Radiology No results found.  Procedures Procedures (including critical care time)  Medications Ordered in ED Medications  ondansetron (ZOFRAN-ODT) disintegrating tablet 8 mg (has no administration in time  range)    ED Course  I have reviewed the triage vital signs and the nursing notes.  Pertinent labs & imaging results that were available during my care of the patient were reviewed by me and considered in my medical decision making (see chart for details).    MDM Rules/Calculators/A&P                          Patient here with left lower jaw pain. Well-appearing nontoxic. She does not appear postictal although endorses 2 seizures earlier today. History of same.  Denies missed doses of her Keppra.  Tenderness of the left lower teeth and jaw, no obvious dental abscess, no trismus, there is a partially erupted left lower third molar that appears impacted. This is likely the source of patient's pain. No evidence of acute otitis media. Patient endorses vomiting and taking multiple doses of Tylenol and ibuprofen. Will check salicylate and acetaminophen levels along with baseline labs.  On recheck, patient ambulating in the department.  She has tolerated oral fluids without difficulty.  Salicylate and acetaminophen levels are reassuring.  No concerning symptoms for Ludewig's angina.  I feel that she is appropriate for discharge home, provided dental resource list.   Final Clinical Impression(s) / ED Diagnoses Final diagnoses:  Pain, dental  Non-intractable vomiting with nausea, unspecified vomiting type    Rx / DC Orders ED Discharge Orders    None       Pauline Aus, PA-C 11/05/19 1540    Terrilee Files, MD 11/06/19 1123

## 2019-11-02 NOTE — Discharge Instructions (Addendum)
Be sure to follow-up with your dentist.  Small frequent sips of fluids tonight.  Bland diet as tolerated starting tomorrow.  Start the antibiotic once your nausea and vomiting have improved.

## 2019-11-02 NOTE — ED Triage Notes (Addendum)
Pt has been having left jaw pain that goes into her ear for 3 days. She had a dental extraction on her right side last week. Was not given any antibiotics for this.States she had a seizure last night. Her dentist told her she is having jaw pain due to TMJ.    She has significant drainage to left ear and increased pain palpating ear lobe and tragus.   Pt currently taking Keppra for seizures like she is supposed to, but states they don't help.

## 2019-11-03 ENCOUNTER — Telehealth (HOSPITAL_COMMUNITY): Payer: Self-pay | Admitting: Emergency Medicine

## 2019-11-03 MED ORDER — PENICILLIN V POTASSIUM 500 MG PO TABS
500.0000 mg | ORAL_TABLET | Freq: Three times a day (TID) | ORAL | 0 refills | Status: DC
Start: 2019-11-03 — End: 2020-03-14

## 2019-11-03 MED ORDER — HYDROCODONE-ACETAMINOPHEN 5-325 MG PO TABS: ORAL_TABLET | ORAL | 0 refills | Status: DC

## 2019-11-03 MED ORDER — ONDANSETRON HCL 4 MG PO TABS
4.0000 mg | ORAL_TABLET | Freq: Four times a day (QID) | ORAL | 0 refills | Status: DC
Start: 2019-11-03 — End: 2020-03-14

## 2019-11-03 NOTE — Telephone Encounter (Cosign Needed)
[facility] pharmacy in [place] call this morning stating they did not have enough medication to fill original prescriptions.  Patient was contacted and requested that prescriptions be sent to [facility] in [place].  Prescriptions from original pharmacy were canceled.

## 2019-11-03 NOTE — ED Notes (Signed)
Pt called requesting prescriptions be sent to [facility] in [facility] because [facility] pharmacy doesn't have the quantity to fill the hydrocodone prescriptions.  Verified with Iantha Fallen, pharmacy intern at  [facility]  and [facility] will e scribe all the prescriptions to [facility] as requested by pt.

## 2020-02-23 ENCOUNTER — Emergency Department (HOSPITAL_COMMUNITY)
Admission: EM | Admit: 2020-02-23 | Discharge: 2020-02-23 | Disposition: A | Discharge status: Home / Self Care | Payer: Medicaid Other | Attending: Emergency Medicine | Admitting: Emergency Medicine

## 2020-02-23 ENCOUNTER — Other Ambulatory Visit: Payer: Self-pay

## 2020-02-23 ENCOUNTER — Encounter (HOSPITAL_COMMUNITY): Payer: Self-pay | Admitting: Emergency Medicine

## 2020-02-23 ENCOUNTER — Emergency Department (HOSPITAL_COMMUNITY): Payer: Medicaid Other

## 2020-02-23 DIAGNOSIS — F17210 Nicotine dependence, cigarettes, uncomplicated: Secondary | ICD-10-CM | POA: Diagnosis not present

## 2020-02-23 DIAGNOSIS — Z20822 Contact with and (suspected) exposure to covid-19: Secondary | ICD-10-CM | POA: Insufficient documentation

## 2020-02-23 DIAGNOSIS — Z91010 Allergy to peanuts: Secondary | ICD-10-CM | POA: Insufficient documentation

## 2020-02-23 DIAGNOSIS — Z79899 Other long term (current) drug therapy: Secondary | ICD-10-CM | POA: Insufficient documentation

## 2020-02-23 DIAGNOSIS — B349 Viral infection, unspecified: Secondary | ICD-10-CM | POA: Diagnosis not present

## 2020-02-23 DIAGNOSIS — Z91040 Latex allergy status: Secondary | ICD-10-CM | POA: Diagnosis not present

## 2020-02-23 DIAGNOSIS — R059 Cough, unspecified: Secondary | ICD-10-CM | POA: Diagnosis present

## 2020-02-23 LAB — BASIC METABOLIC PANEL
Anion gap: 9 (ref 5–15)
BUN: 14 mg/dL (ref 6–20)
CO2: 24 mmol/L (ref 22–32)
Calcium: 8.9 mg/dL (ref 8.9–10.3)
Chloride: 105 mmol/L (ref 98–111)
Creatinine, Ser: 0.7 mg/dL (ref 0.44–1.00)
GFR, Estimated: >60 mL/min (ref >60)
Glucose, Bld: 75 mg/dL (ref 70–99)
Potassium: 3.5 mmol/L (ref 3.5–5.1)
Sodium: 138 mmol/L (ref 135–145)

## 2020-02-23 LAB — POC URINE PREG, ED: Preg Test, Ur: NEGATIVE

## 2020-02-23 LAB — CBC
HCT: 48.1 % — ABNORMAL HIGH (ref 36.0–46.0)
Hemoglobin: 15.6 g/dL — ABNORMAL HIGH (ref 12.0–15.0)
MCH: 29.8 pg (ref 26.0–34.0)
MCHC: 32.4 g/dL (ref 30.0–36.0)
MCV: 92 fL (ref 80.0–100.0)
Platelets: 263 10*3/uL (ref 150–400)
RBC: 5.23 MIL/uL — ABNORMAL HIGH (ref 3.87–5.11)
RDW: 11.6 % (ref 11.5–15.5)
WBC: 6.8 10*3/uL (ref 4.0–10.5)
nRBC: 0 % (ref 0.0–0.2)

## 2020-02-23 LAB — HEPATIC FUNCTION PANEL
ALT: 21 U/L (ref 0–44)
AST: 18 U/L (ref 15–41)
Albumin: 3.7 g/dL (ref 3.5–5.0)
Alkaline Phosphatase: 60 U/L (ref 38–126)
Bilirubin, Direct: 0.1 mg/dL (ref 0.0–0.2)
Indirect Bilirubin: 0.6 mg/dL (ref 0.3–0.9)
Total Bilirubin: 0.7 mg/dL (ref 0.3–1.2)
Total Protein: 6.5 g/dL (ref 6.5–8.1)

## 2020-02-23 LAB — URINALYSIS, ROUTINE W REFLEX MICROSCOPIC
Bilirubin Urine: NEGATIVE
Glucose, UA: NEGATIVE mg/dL
Ketones, ur: NEGATIVE mg/dL
Leukocytes,Ua: NEGATIVE
Nitrite: NEGATIVE
Protein, ur: NEGATIVE mg/dL
Specific Gravity, Urine: 1.017 (ref 1.005–1.030)
pH: 6 (ref 5.0–8.0)

## 2020-02-23 LAB — LIPASE, BLOOD: Lipase: 26 U/L (ref 11–51)

## 2020-02-23 LAB — RESP PANEL BY RT PCR (RSV, FLU A&B, COVID)
Influenza A by PCR: NEGATIVE
Influenza B by PCR: NEGATIVE
Respiratory Syncytial Virus by PCR: NEGATIVE
SARS Coronavirus 2 by RT PCR: NEGATIVE

## 2020-02-23 LAB — CBG MONITORING, ED: Glucose-Capillary: 88 mg/dL (ref 70–99)

## 2020-02-23 MED ORDER — ONDANSETRON 4 MG PO TBDP
4.0000 mg | ORAL_TABLET | Freq: Once | ORAL | Status: AC
  Administered 2020-02-23: 4 mg via ORAL
  Filled 2020-02-23: qty 1

## 2020-02-23 MED ORDER — ONDANSETRON 4 MG PO TBDP: 4.0000 mg | ORAL_TABLET | Freq: Three times a day (TID) | ORAL | 0 refills | Status: DC | PRN

## 2020-02-23 NOTE — ED Provider Notes (Signed)
[facility] EMERGENCY DEPARTMENT Provider Note   CSN: [id number] Arrival date & time: 02/23/20  1451     History Chief Complaint  Patient presents with  . Cough    Pamela Mccarty is a 24 y.o. female with past medical history including ADHD, depression, seizure disorder and opioid use disorder, no significant past medical history presenting with approximate 2-week history of multiple complaints.  First, she describes simple generalized fatigue, also endorses a 30 pound weight loss over the past 2 weeks with reduced appetite, dizziness. she also describes nausea with 1-2 episodes of emesis daily, nonbloody.  She also has developed a cough which is a occasionally productive of blood-tinged sputum.  The symptoms started within the past week.  She works in [employer] and blames this symptom on inhalation of grease on a chronic basis.  She denies shortness of breath and has no chest pain.  She also denies fevers or chills.  She does endorse upper abdominal pain across her entire upper abdomen which radiates into her back and is concerned about possible liver or kidney problems.  She describes dysuria and increased urinary frequency stating she has had to urinate about every 30 minutes for the past several days.  Denies hematuria.  Her LMP was 01/28/2020 and doubts possibility of pregnancy.  She endorses a fever of 99.9 yesterday evening.  She denies vaginal discharge, diarrhea or constipation, no abdominal distention.  She has had no medications for her symptoms prior to arrival.  States both parents have diabetes and is concerned about this possibility  Of note, apparently patient had a near syncopal episode in the waiting room while she was having her blood work drawn. She did not fall, was sitting in a chair during this occurrence.   HPI     Past Medical History:  Diagnosis Date  . ADHD (attention deficit hyperactivity disorder)   . Anxiety   . Depression   . Headache(784.0)   . Obesity     . Seizures (HCC)    last seizure 08/29/19  . Seizures [facility])     Patient Active Problem List   Diagnosis Date Noted  . Seizure (HCC) 02/27/2018  . Opioid use disorder, severe, dependence (HCC) 12/10/2015  . Suicidal ideation   . 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 Attention-deficit hyperactivity disorder, 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  . Oppositional defiant disorder 06/28/2011    Past Surgical History:  Procedure Laterality Date  . ADENOIDECTOMY    . ANKLE SURGERY    . ANKLE SURGERY  at 24yo   Pt. reports to lengthen her tendons  . TONSILLECTOMY       OB History    Gravida  2   Para      Term      Preterm      AB  1   Living        SAB      TAB      Ectopic      Multiple      Live Births              Family History  Problem Relation Age of Onset  . Bipolar disorder Mother   . Cancer Mother   . Diabetes Mother   . Hypertension Mother   . Hypertension Father   . Stroke Father     Social History   Tobacco Use  . Smoking status: Current Every Day Smoker    Packs/day: 0.10  Types: Cigarettes    Last attempt to quit: 01/13/2016    Years since quitting: 4.1  . Smokeless tobacco: Never Used  Vaping Use  . Vaping Use: Never used  Substance Use Topics  . Alcohol use: No  . Drug use: Yes    Frequency: 7.0 times per week    Types: Marijuana    Home Medications Prior to Admission medications   Medication Sig Start Date End Date Taking? Authorizing Provider  levETIRAcetam (KEPPRA) 500 MG tablet Take 500 mg by mouth 2 (two) times daily. 04/26/18  Yes [provider]  clomiPHENE (CLOMID) 50 MG tablet Take 1 tablet (50 mg total) by mouth daily. Expect ovulation 7 days after last pill Patient not taking: Reported on 02/23/2020 08/18/18   Tilda Burrow, MD  desogestrel-ethinyl estradiol (APRI,EMOQUETTE,SOLIA) 0.15-30 MG-MCG tablet Take 1 tablet by mouth daily. Patient not taking: Reported  on 07/27/2018 01/19/18   Lazaro Arms, MD  HYDROcodone-acetaminophen (NORCO/VICODIN) 5-325 MG tablet Take one tab po q 4 hrs prn pain Patient not taking: Reported on 02/23/2020 11/03/19   Triplett, Tammy, PA-C  lidocaine (XYLOCAINE) 2 % solution Use as directed 15 mLs in the mouth or throat as needed for mouth pain. Patient not taking: Reported on 02/23/2020 08/30/19   Michela Pitcher A, PA-C  loperamide (IMODIUM) 2 MG capsule Take 1 capsule (2 mg total) by mouth 4 (four) times daily as needed for diarrhea or loose stools. Patient not taking: Reported on 02/23/2020 07/11/19   Burgess Amor, PA-C  naproxen (NAPROSYN) 500 MG tablet Take 1 tablet (500 mg total) by mouth 2 (two) times daily with a meal. Patient not taking: Reported on 02/23/2020 08/30/19   Michela Pitcher A, PA-C  ondansetron (ZOFRAN ODT) 4 MG disintegrating tablet Take 1 tablet (4 mg total) by mouth every 8 (eight) hours as needed for nausea or vomiting. 02/23/20   Burgess Amor, PA-C  ondansetron (ZOFRAN) 4 MG tablet Take 1 tablet (4 mg total) by mouth every 6 (six) hours. 11/03/19   Triplett, Tammy, PA-C  penicillin v potassium (VEETID) 500 MG tablet Take 1 tablet (500 mg total) by mouth 3 (three) times daily. Patient not taking: Reported on 02/23/2020 11/03/19   Pauline Aus, PA-C    Allergies    Peanut-containing drug products, Latex, Soy allergy, and Tomato  Review of Systems   Review of Systems  Constitutional: Positive for appetite change and fever.  HENT: Negative for congestion and sore throat.   Eyes: Negative.   Respiratory: Positive for cough and shortness of breath. Negative for chest tightness.   Cardiovascular: Negative for chest pain.  Gastrointestinal: Positive for abdominal pain, nausea and vomiting. Negative for diarrhea.  Genitourinary: Positive for dysuria.  Musculoskeletal: Negative for arthralgias, joint swelling and neck pain.  Skin: Negative.  Negative for rash and wound.  Neurological: Positive for dizziness.  Negative for weakness, light-headedness, numbness and headaches.  Psychiatric/Behavioral: Negative.     Physical Exam Updated Vital Signs BP 128/79 (BP Location: Right Arm)   Pulse 86   Temp 98 F (36.7 C) (Oral)   Resp 18   Ht 5\' 6"  (1.676 m)   Wt 97.5 kg   LMP 01/28/2020 (Exact Date)   SpO2 100%   BMI 34.70 kg/m   Physical Exam Vitals and nursing note reviewed.  Constitutional:      Appearance: She is well-developed.  HENT:     Head: Normocephalic and atraumatic.  Eyes:     Extraocular Movements: Extraocular movements intact.  Conjunctiva/sclera: Conjunctivae normal.     Pupils: Pupils are equal, round, and reactive to light.  Cardiovascular:     Rate and Rhythm: Normal rate and regular rhythm.     Heart sounds: Normal heart sounds.  Pulmonary:     Effort: Pulmonary effort is normal. No respiratory distress.     Breath sounds: Normal breath sounds. No wheezing or rhonchi.  Abdominal:     General: Bowel sounds are normal.     Palpations: Abdomen is soft.     Tenderness: There is abdominal tenderness. There is no right CVA tenderness, left CVA tenderness, guarding or rebound. Negative signs include Murphy's sign and McBurney's sign.     Comments: Generalized abdominal pain without guarding or rebound.  She endorses pain in all of her quadrants except the left lower quadrant.  No increased tympany to percussion or distention, abdomen is protuberant.  Musculoskeletal:        General: Normal range of motion.     Cervical back: Normal range of motion.  Skin:    General: Skin is warm and dry.  Neurological:     Mental Status: She is alert.     ED Results / Procedures / Treatments   Labs (all labs ordered are listed, but only abnormal results are displayed) Labs Reviewed  CBC - Abnormal; Notable for the following components:      Result Value   RBC 5.23 (*)    Hemoglobin 15.6 (*)    HCT 48.1 (*)    All other components within normal limits  URINALYSIS, ROUTINE  W REFLEX MICROSCOPIC - Abnormal; Notable for the following components:   APPearance HAZY (*)    Hgb urine dipstick SMALL (*)    Bacteria, UA RARE (*)    All other components within normal limits  RESP PANEL BY RT PCR (RSV, FLU A&B, COVID)  BASIC METABOLIC PANEL  HEPATIC FUNCTION PANEL  LIPASE, BLOOD  CBG MONITORING, ED  POC URINE PREG, ED    EKG EKG Interpretation  Date/Time:  Wednesday February 23 2020 16:43:50 EST Ventricular Rate:  83 PR Interval:  130 QRS Duration: 85 QT Interval:  356 QTC Calculation: 419 R Axis:   90 Text Interpretation: Sinus rhythm Rightward axis Nonspecific T wave abnormality Confirmed by Cathren LaineSteinl, Kevin ([id number]) on 02/23/2020 6:38:48 PM   Radiology DG Chest Port 1 View  Result Date: 02/23/2020 CLINICAL DATA:  24 year old female with hemoptysis. EXAM: PORTABLE CHEST 1 VIEW COMPARISON:  None. FINDINGS: The heart size and mediastinal contours are within normal limits. Both lungs are clear. The visualized skeletal structures are unremarkable. IMPRESSION: No active disease. Electronically Signed   By: Elgie CollardArash  Radparvar M.D.   On: 02/23/2020 18:13    Procedures Procedures (including critical care time)  Medications Ordered in ED Medications  ondansetron (ZOFRAN-ODT) disintegrating tablet 4 mg (4 mg Oral Given 02/23/20 1910)    ED Course  I have reviewed the triage vital signs and the nursing notes.  Pertinent labs & imaging results that were available during my care of the patient were reviewed by me and considered in my medical decision making (see chart for details).    MDM Rules/Calculators/A&P                          [name] labs, cxr and ekg reviewed and discussed with pt.  Labs and exam reassuring.  No acute abd on repeat abd exam prior to dc, no guarding, abd soft.  No emesis or  diarrhea in dept.  Suspect viral syndrome, prescribed zofran. Rest, increased fluid intake. Discussed recheck here for any worsening or localizing abd pain sx.     Prior to dc, also stated she had a nose bleed this am, hemoptysis probably secondary to this .  Final Clinical Impression(s) / ED Diagnoses Final diagnoses:  Acute viral syndrome    Rx / DC Orders ED Discharge Orders         Ordered    ondansetron (ZOFRAN ODT) 4 MG disintegrating tablet  Every 8 hours PRN        02/23/20 1901           Victoriano Lain 02/23/20 1924    Cathren Laine, MD 02/25/20 [phone]

## 2020-02-23 NOTE — ED Notes (Signed)
This nurse was alerted by the lab tech the patient passed out after having blood drawn. Patient denies falling out of the chair and other patients around her said she did not fall. Pt states she blacked out.

## 2020-02-23 NOTE — ED Notes (Signed)
Pt states [facility] has been in [facility] for an extended period of time and feels like "hammered shit".    Explained to pt the triage process and seeing the sickest pts first.  Pt tells [place] her husband is in the [facility] waiting on her and needs to be seen too.   Pt wanting to drink her Mt. Dew that she" needs".

## 2020-02-23 NOTE — ED Notes (Signed)
Patient discharged to home.  Ambulatory out of ED.  0 s/s acute distress.  Patient receptive to discharge instructions and able to verbalize understanding via teachback method.

## 2020-02-23 NOTE — Discharge Instructions (Signed)
Rest to make sure you are drinking plenty of fluids.  You have been prescribed Zofran to help you with nausea or vomiting.  Your lab tests and chest x-ray are reassuring today with no abnormalities found.  I suspect the blood that you saw mixed with your sputum probably originated from your nosebleed.  Your chest x-ray is clear of any infection.  As discussed, get rechecked for any worsening symptoms, specifically any worsening abdominal pain.

## 2020-02-23 NOTE — ED Triage Notes (Signed)
Pt states she has been coughing up blood because she works in [employer]. She has also been very dizzy, and has painful urination.

## 2020-02-24 ENCOUNTER — Encounter (HOSPITAL_COMMUNITY): Payer: Self-pay

## 2020-03-08 ENCOUNTER — Emergency Department (HOSPITAL_COMMUNITY)
Admission: EM | Admit: 2020-03-08 | Discharge: 2020-03-08 | Disposition: A | Discharge status: Home / Self Care | Payer: Medicaid Other | Attending: Emergency Medicine | Admitting: Emergency Medicine

## 2020-03-08 ENCOUNTER — Other Ambulatory Visit: Payer: Self-pay

## 2020-03-08 ENCOUNTER — Encounter (HOSPITAL_COMMUNITY): Payer: Self-pay | Admitting: Emergency Medicine

## 2020-03-08 DIAGNOSIS — F17210 Nicotine dependence, cigarettes, uncomplicated: Secondary | ICD-10-CM | POA: Insufficient documentation

## 2020-03-08 DIAGNOSIS — G40909 Epilepsy, unspecified, not intractable, without status epilepticus: Secondary | ICD-10-CM | POA: Insufficient documentation

## 2020-03-08 DIAGNOSIS — R569 Unspecified convulsions: Secondary | ICD-10-CM | POA: Diagnosis present

## 2020-03-08 LAB — URINALYSIS, ROUTINE W REFLEX MICROSCOPIC
Bilirubin Urine: NEGATIVE
Glucose, UA: NEGATIVE mg/dL
Hgb urine dipstick: NEGATIVE
Ketones, ur: NEGATIVE mg/dL
Leukocytes,Ua: NEGATIVE
Nitrite: NEGATIVE
Protein, ur: NEGATIVE mg/dL
Specific Gravity, Urine: 1.016 (ref 1.005–1.030)
pH: 8 (ref 5.0–8.0)

## 2020-03-08 LAB — BASIC METABOLIC PANEL
Anion gap: 7 (ref 5–15)
BUN: 14 mg/dL (ref 6–20)
CO2: 26 mmol/L (ref 22–32)
Calcium: 9.1 mg/dL (ref 8.9–10.3)
Chloride: 105 mmol/L (ref 98–111)
Creatinine, Ser: 0.74 mg/dL (ref 0.44–1.00)
GFR, Estimated: >60 mL/min (ref >60)
Glucose, Bld: 87 mg/dL (ref 70–99)
Potassium: 4 mmol/L (ref 3.5–5.1)
Sodium: 138 mmol/L (ref 135–145)

## 2020-03-08 LAB — CBC
HCT: 48.8 % — ABNORMAL HIGH (ref 36.0–46.0)
Hemoglobin: 16.1 g/dL — ABNORMAL HIGH (ref 12.0–15.0)
MCH: 30.3 pg (ref 26.0–34.0)
MCHC: 33 g/dL (ref 30.0–36.0)
MCV: 91.9 fL (ref 80.0–100.0)
Platelets: 298 10*3/uL (ref 150–400)
RBC: 5.31 MIL/uL — ABNORMAL HIGH (ref 3.87–5.11)
RDW: 11.9 % (ref 11.5–15.5)
WBC: 7.6 10*3/uL (ref 4.0–10.5)
nRBC: 0 % (ref 0.0–0.2)

## 2020-03-08 LAB — PREGNANCY, URINE: Preg Test, Ur: NEGATIVE

## 2020-03-08 MED ORDER — LEVETIRACETAM 750 MG PO TABS: 750.0000 mg | ORAL_TABLET | Freq: Two times a day (BID) | ORAL | 1 refills | Status: DC

## 2020-03-08 NOTE — ED Provider Notes (Signed)
[facility]-EMERGENCY DEPT [facility] Emergency Department Provider Note MRN:  [id number]  Arrival date & time: 03/08/20     Chief Complaint   Seizures   History of Present Illness   Mackinzie Vuncannon is a 24 y.o. year-old female with a history of seizure disorder presenting to the ED with chief complaint of seizure.  Increased frequency of seizures for the past few weeks.  Having she estimates 8/day.  Thinks that she woke up during 1 today and noticed herself shaking.  Denies fever, no headache or vision change, no nausea or vomiting, no chest pain or shortness of breath, no abdominal pain, using marijuana but no other alcohol or drugs, endorses compliance with her 500 mg twice daily Keppra.  Review of Systems  A complete 10 system review of systems was obtained and all systems are negative except as noted in the HPI and PMH.   Patient's Health History    Past Medical History:  Diagnosis Date  . ADHD (attention deficit hyperactivity disorder)   . Anxiety   . Depression   . Headache(784.0)   . Obesity   . Seizures (HCC)    last seizure 08/29/19  . Seizures (HCC)     Past Surgical History:  Procedure Laterality Date  . ADENOIDECTOMY    . ANKLE SURGERY    . ANKLE SURGERY  at 24yo   Pt. reports to lengthen her tendons  . TONSILLECTOMY      Family History  Problem Relation Age of Onset  . Bipolar disorder Mother   . Cancer Mother   . Diabetes Mother   . Hypertension Mother   . Hypertension Father   . Stroke Father     Social History   Socioeconomic History  . Marital status: Single    Spouse name: Not on file  . Number of children: Not on file  . Years of education: Not on file  . Highest education level: Not on file  Occupational History  . Occupation: Consulting civil engineer    Comment: 8th grade at [employer]  Tobacco Use  . Smoking status: Current Every Day Smoker    Packs/day: 0.10    Types: Cigarettes    Last attempt to quit: 01/13/2016    Years since  quitting: 4.1  . Smokeless tobacco: Never Used  Vaping Use  . Vaping Use: Never used  Substance and Sexual Activity  . Alcohol use: No  . Drug use: Yes    Frequency: 7.0 times per week    Types: Marijuana  . Sexual activity: Never    Comment: reports she is "gay", has had a boyfriend in past, but states she is only interested in girls, does not want mom to know  Other Topics Concern  . Not on file  Social History Narrative  . Not on file   Social Determinants of Health   Financial Resource Strain:   . Difficulty of Paying Living Expenses: Not on file  Food Insecurity:   . Worried About Programme researcher, broadcasting/film/video in the Last Year: Not on file  . Ran Out of Food in the Last Year: Not on file  Transportation Needs:   . Lack of Transportation (Medical): Not on file  . Lack of Transportation (Non-Medical): Not on file  Physical Activity:   . Days of Exercise per Week: Not on file  . Minutes of Exercise per Session: Not on file  Stress:   . Feeling of Stress : Not on file  Social Connections:   .  Frequency of Communication with Friends and Family: Not on file  . Frequency of Social Gatherings with Friends and Family: Not on file  . Attends Religious Services: Not on file  . Active Member of Clubs or Organizations: Not on file  . Attends Banker Meetings: Not on file  . Marital Status: Not on file  Intimate Partner Violence:   . Fear of Current or Ex-Partner: Not on file  . Emotionally Abused: Not on file  . Physically Abused: Not on file  . Sexually Abused: Not on file     Physical Exam   Vitals:   03/08/20 1930 03/08/20 2000  BP: 120/78 117/65  Pulse: 70 75  Resp: (!) 25 20  Temp:    SpO2: 100% 100%    CONSTITUTIONAL: Well-appearing, NAD NEURO:  Alert and oriented x 3, normal and symmetric strength and sensation, normal coordination, normal speech EYES:  eyes equal and reactive ENT/NECK:  no LAD, no JVD CARDIO: Regular rate, well-perfused, normal S1 and  S2 PULM:  CTAB no wheezing or rhonchi GI/GU:  normal bowel sounds, non-distended, non-tender MSK/SPINE:  No gross deformities, no edema SKIN:  no rash, atraumatic PSYCH:  Appropriate speech and behavior  *Additional and/or pertinent findings included in MDM below  Diagnostic and Interventional Summary    EKG Interpretation  Date/Time:  Wednesday March 08 2020 17:44:00 EST Ventricular Rate:  83 PR Interval:  134 QRS Duration: 76 QT Interval:  356 QTC Calculation: 418 R Axis:   92 Text Interpretation: Normal sinus rhythm with sinus arrhythmia Rightward axis Borderline ECG Confirmed by Kennis Carina [id number]) on 03/08/2020 8:28:00 PM      Labs Reviewed  CBC - Abnormal; Notable for the following components:      Result Value   RBC 5.31 (*)    Hemoglobin 16.1 (*)    HCT 48.8 (*)    All other components within normal limits  URINALYSIS, ROUTINE W REFLEX MICROSCOPIC - Abnormal; Notable for the following components:   APPearance CLOUDY (*)    All other components within normal limits  PREGNANCY, URINE  BASIC METABOLIC PANEL    No orders to display    Medications - No data to display   Procedures  /  Critical Care Procedures  ED Course and Medical Decision Making  I have reviewed the triage vital signs, the nursing notes, and pertinent available records from the EMR.  Listed above are laboratory and imaging tests that I personally ordered, reviewed, and interpreted and then considered in my medical decision making (see below for details).  Increased frequency of seizures, but reassuring exam, normal vital signs, does not have a primary care doctor or neurologist, has not had a dose change of her Keppra in the long time.  It appears that she has been on a higher dose of Keppra in the past.  Suggest that we increase dose to 750 twice daily and refer to [facility] neurological services.       Elmer Sow. Pilar Plate, MD [facility] Emergency Medicine [facility]  Health [email or web address]  Final Clinical Impressions(s) / ED Diagnoses     ICD-10-CM   1. Seizure disorder [facility])  G40.909     ED Discharge Orders    None       Discharge Instructions Discussed with and Provided to Patient:   Discharge Instructions   None       Sabas Sous, MD 03/08/20 2030

## 2020-03-08 NOTE — Discharge Instructions (Addendum)
You were evaluated in the Emergency Department and after careful evaluation, we did not find any emergent condition requiring admission or further testing in the hospital.  Your exam/testing today is overall reassuring.  Your labs were normal.  We recommend increasing your dose of Keppra and following up closely with the neurologists.  Please return to the Emergency Department if you experience any worsening of your condition.   Thank you for allowing [place] to be a part of your care.

## 2020-03-08 NOTE — ED Triage Notes (Signed)
Pt states she has been having seizures x2 days. Pt states she looses control of bladder and bowel.

## 2020-03-14 ENCOUNTER — Ambulatory Visit: Payer: Medicaid Other | Admitting: Neurology

## 2020-03-14 ENCOUNTER — Encounter: Payer: Self-pay | Admitting: Neurology

## 2020-03-14 VITALS — BP 122/71 | HR 65 | Ht 66.0 in | Wt 223.0 lb

## 2020-03-14 DIAGNOSIS — R569 Unspecified convulsions: Secondary | ICD-10-CM

## 2020-03-14 DIAGNOSIS — Z5181 Encounter for therapeutic drug level monitoring: Secondary | ICD-10-CM | POA: Diagnosis not present

## 2020-03-14 MED ORDER — FOLIC ACID 1 MG PO TABS: 1.0000 mg | ORAL_TABLET | Freq: Every day | ORAL | 3 refills | Status: DC

## 2020-03-14 MED ORDER — ALPRAZOLAM 0.5 MG PO TABS: ORAL_TABLET | ORAL | 0 refills | Status: DC

## 2020-03-14 NOTE — Progress Notes (Signed)
Reason for visit: Seizures  Referring physician: [place]  Pamela Mccarty is a 24 y.o. female  History of present illness:  Pamela Mccarty is a 24 year old left-handed white female with a history she claims of seizure events since she was 26 or 24 years old. She claims that her mother and her father and her brother also have seizures. The patient claims that she has never been followed through a neurologist, she has only been getting her medications through the emergency room when she gets her seizures. The patient claims that her seizures generally occur 2-3 times a week but over the last month they have become daily, she is having 2-3 events a day now. The patient states that the seizures are associated with sudden blackout, she may have some dizziness and headache before the onset of the seizure. She claims that she will bite her tongue, have generalized jerking and may have some urinary or bowel incontinence. She may occasionally hit her head when she has a seizure. She reports some bilateral hand numbness at times and some weakness in the low back. She has been placed on Keppra and the dose was recently increased after ER visit on 08 March 2020. She claims that on Keppra her seizures seem to be getting worse. She has undergone a CT scan of the brain has been unremarkable, she has never had an EEG study or an MRI study. She is sent to this office for an evaluation.  Past Medical History:  Diagnosis Date  . ADHD (attention deficit hyperactivity disorder)   . Anxiety   . Depression   . Headache(784.0)   . Obesity   . Seizures (HCC)    last seizure 08/29/19  . Seizures (HCC)     Past Surgical History:  Procedure Laterality Date  . ADENOIDECTOMY    . ANKLE SURGERY    . ANKLE SURGERY  at 24yo   Pt. reports to lengthen her tendons  . TONSILLECTOMY      Family History  Problem Relation Age of Onset  . Bipolar disorder Mother   . Cancer Mother   . Diabetes Mother   . Hypertension  Mother   . Seizures Mother   . Hypertension Father   . Stroke Father   . Seizures Father   . Seizures Brother     Social history:  reports that she quit smoking about 4 years ago. Her smoking use included cigarettes. She smoked 0.10 packs per day. She has never used smokeless tobacco. She reports current drug use. Frequency: 7.00 times per week. Drug: Marijuana. She reports that she does not drink alcohol.  Medications:  Prior to Admission medications   Medication Sig Start Date End Date Taking? Authorizing Provider  acetaminophen (TYLENOL) 500 MG tablet Take 1,000 mg by mouth every 6 (six) hours as needed.   Yes [provider]  clomiPHENE (CLOMID) 50 MG tablet Take 1 tablet (50 mg total) by mouth daily. Expect ovulation 7 days after last pill 08/18/18  Yes Tilda Burrow, MD  desogestrel-ethinyl estradiol (APRI,EMOQUETTE,SOLIA) 0.15-30 MG-MCG tablet Take 1 tablet by mouth daily. 01/19/18  Yes Lazaro Arms, MD  HYDROcodone-acetaminophen (NORCO/VICODIN) 5-325 MG tablet Take one tab po q 4 hrs prn pain 11/03/19  Yes Triplett, Tammy, PA-C  ibuprofen (ADVIL) 200 MG tablet Take 600 mg by mouth every 6 (six) hours as needed.   Yes [provider]  levETIRAcetam (KEPPRA) 750 MG tablet Take 1 tablet (750 mg total) by mouth 2 (two) times  daily. 03/08/20  Yes Sabas Sous, MD  lidocaine (XYLOCAINE) 2 % solution Use as directed 15 mLs in the mouth or throat as needed for mouth pain. 08/30/19  Yes Fawze, Mina A, PA-C  loperamide (IMODIUM) 2 MG capsule Take 1 capsule (2 mg total) by mouth 4 (four) times daily as needed for diarrhea or loose stools. 07/11/19  Yes Idol, Raynelle Fanning, PA-C  naproxen (NAPROSYN) 500 MG tablet Take 1 tablet (500 mg total) by mouth 2 (two) times daily with a meal. 08/30/19  Yes Fawze, Mina A, PA-C  ondansetron (ZOFRAN ODT) 4 MG disintegrating tablet Take 1 tablet (4 mg total) by mouth every 8 (eight) hours as needed for nausea or vomiting. 02/23/20  Yes Idol, Raynelle Fanning,  PA-C  ondansetron (ZOFRAN) 4 MG tablet Take 1 tablet (4 mg total) by mouth every 6 (six) hours. 11/03/19  Yes Triplett, Tammy, PA-C  penicillin v potassium (VEETID) 500 MG tablet Take 1 tablet (500 mg total) by mouth 3 (three) times daily. 11/03/19  Yes Triplett, Tammy, PA-C      Allergies  Allergen Reactions  . Peanut-Containing Drug Products Anaphylaxis    Stomach bubbles with Peanut oil only  (Patient states that she is not allergic to this)  . Latex   . Soy Allergy     hives  . Tomato     Throat swelling, shortness of breath     ROS:  Out of a complete 14 system review of symptoms, the patient complains only of the following symptoms, and all other reviewed systems are negative.  Seizures hand numbness  Blood pressure 122/71, pulse 65, height 5\' 6"  (1.676 m), weight 223 lb (101.2 kg), last menstrual period 02/20/2020.  Physical Exam  General: The patient is alert and cooperative at the time of the examination. The patient is moderately to markedly obese.  Eyes: Pupils are equal, round, and reactive to light. Discs are flat bilaterally.  Neck: The neck is supple, no carotid bruits are noted.  Respiratory: The respiratory examination is clear.  Cardiovascular: The cardiovascular examination reveals a regular rate and rhythm, no obvious murmurs or rubs are noted.  Skin: Extremities are without significant edema.  Neurologic Exam  Mental status: The patient is alert and oriented x 3 at the time of the examination. The patient has apparent normal recent and remote memory, with an apparently normal attention span and concentration ability.  Cranial nerves: Facial symmetry is present. There is good sensation of the face to pinprick and soft touch bilaterally, but the patient has decreased vibration sensation on the right forehead as compared to the left, she splits the midline of the forehead with vibration sensation. The strength of the facial muscles and the muscles to  head turning and shoulder shrug are normal bilaterally. Speech is well enunciated, no aphasia or dysarthria is noted. Extraocular movements are full. Visual fields are full. The tongue is midline, and the patient has symmetric elevation of the soft palate. No obvious hearing deficits are noted.  Motor: The motor testing reveals 5 over 5 strength of all 4 extremities. Good symmetric motor tone is noted throughout.  Sensory: Sensory testing is notable for some decreased pinprick sensation on the right arms compared to left, symmetric in the legs. There is decreased vibration sensation and position sensation on the right arm, decreased vibration sensation on the right leg, normal position sense. Vibration and position sense are normal in the left arm and left leg. No evidence of extinction is noted.  Coordination: Cerebellar  testing reveals good finger-nose-finger and heel-to-shin bilaterally.  Gait and station: Gait is normal. Tandem gait is normal. Romberg is negative. No drift is seen.  Reflexes: Deep tendon reflexes are symmetric and normal bilaterally. Toes are downgoing bilaterally.   CT head 07/30/19:  IMPRESSION: Normal unenhanced CT of the brain.  * CT scan images were reviewed online. I agree with the written report.    Assessment/Plan:  1. Reported daily seizures  2. Nonorganic sensory examination  The patient reports 2-3 generalized seizures a day currently, her examination is unremarkable with exception that she has a nonorganic sensory deficit. I suspect that most if not all of her seizure events may be psychogenic in nature. The patient will be set up for MRI of the brain with and without gadolinium enhancement, she will have an EEG study. If the studies are unremarkable, she may be a good candidate for a prolonged EEG evaluation or a video EEG evaluation. She will remain on her current dose of Keppra, folic acid will be added to the regimen. The patient will have a blood  level checked for the Keppra.  Marlan Palau MD 03/14/2020 4:20 PM  [facility] [address] [place], [place] [address]  Phone [phone] Fax [phone]

## 2020-03-15 ENCOUNTER — Telehealth: Payer: Self-pay | Admitting: Neurology

## 2020-03-15 NOTE — Telephone Encounter (Signed)
medicaid order sent to [facility]. They will reach out to the patient to schedule  °

## 2020-03-20 ENCOUNTER — Ambulatory Visit: Payer: Medicaid Other | Admitting: Neurology

## 2020-03-20 DIAGNOSIS — R569 Unspecified convulsions: Secondary | ICD-10-CM

## 2020-03-30 NOTE — Procedures (Signed)
   HISTORY: 24 years old female presented with seizure  TECHNIQUE:  This is a routine 16 channel EEG recording with one channel devoted to a limited EKG recording.  It was performed during wakefulness, drowsiness and asleep.  Hyperventilation and photic stimulation were performed as activating procedures.  There are frequent movement and electrode artifact  Upon maximum arousal, posterior dominant waking rhythm was interrupted by frequent electrode and movement artifact, activity is symmetric, during photic stimulation, there was noted rhythmic alpha range activity, that is symmetric.  Hyperventilation produced mild/moderate buildup with higher amplitude and the slower activities noted.  During EEG recording, patient developed drowsiness and no deeper stage of sleep was achieved  During EEG recording, there was no epileptiform discharge noted.  EKG demonstrate sinus rhythm, with heart rate of 88 bpm  CONCLUSION: This is a  normal yet technically difficult EEG.  There is no electrodiagnostic evidence of epileptiform discharge.  Levert Feinstein, M.D. Ph.D.  [facility] [address] [place], [place] [address] Phone: [phone] Fax:      [phone]

## 2020-03-31 ENCOUNTER — Telehealth: Payer: Self-pay | Admitting: Neurology

## 2020-03-31 NOTE — Telephone Encounter (Signed)
I tried to call patient regarding EEG study, the telephone is not excepting calls.  I will try to call back later.  The EEG study was relatively unremarkable, would consider a prolonged EEG evaluation or video EEG monitoring.

## 2020-04-02 NOTE — Telephone Encounter (Signed)
I tried to call patient again, the telephone is blocked, will not take any incoming calls.  I will try back later.

## 2020-04-03 ENCOUNTER — Encounter: Payer: Self-pay | Admitting: *Deleted

## 2020-04-03 NOTE — Telephone Encounter (Signed)
Letter sent to pt

## 2020-04-03 NOTE — Telephone Encounter (Signed)
I tried to call patient again, again the telephone is blocked.  I will have my nurse generate a letter indicating the results of the EEG study, and requesting that the patient contact our office and give [place] a number where she can be reached.

## 2020-04-08 NOTE — L&D Delivery Note (Signed)
OB/[facility]N Faculty [facility] Delivery Note  Pamela Mccarty is a 25 y.o. [id number] s/p SVD at [redacted]w[redacted]d. She was admitted for PPROM/PTL.   ROM: 28h 67m with clear fluid GBS Status: Negative per PCR - received Amp/Azithro due to PPROM  Delivery Date/Time: 01/30/21 at 2051  Delivery: Called to room and patient was complete and pushing. Head delivered LOA. No nuchal cord present. Shoulder and body delivered in usual fashion. Cord clamped x 2 and cut immediately and infant was handed off to awaiting neonatology team. Cord blood and cord gas drawn. Placenta delivered spontaneously with gentle cord traction. Fundus firm with massage and Pitocin. Labia, perineum, vagina, and cervix were inspected, and patient was found to have bilateral labial abrasions that were hemostatic and not repaired.   Placenta: Intact; 3VC - sent to pathology Complications: None  Lacerations: Bilateral labial abrasions  EBL: 100 cc Analgesia: Epidural   Infant: Viable female  APGARs 8 and 9  Weight 1570 g  Evalina Field, MD OB/GYN Fellow, Faculty Practice

## 2020-06-12 ENCOUNTER — Encounter: Payer: Self-pay | Admitting: Neurology

## 2020-06-12 ENCOUNTER — Ambulatory Visit: Payer: Medicaid Other | Admitting: Neurology

## 2020-06-12 NOTE — Progress Notes (Deleted)
PATIENT: Pamela Mccarty DOB: 19-Sep-1995  REASON FOR VISIT: follow up HISTORY FROM: patient  HISTORY OF PRESENT ILLNESS: Today 06/12/20 Pamela Mccarty is a 25 year old female with history of seizures.  EEG was relatively unremarkable.  Would consider prolonged EEG.  Remains on Keppra.  MRI of the brain was ordered, but has yet to be completed. HISTORY 03/14/2020 [name]: Pamela Mccarty is a 25 year old left-handed white female with a history she claims of seizure events since she was 52 or 24 years old. She claims that her mother and her father and her brother also have seizures. The patient claims that she has never been followed through a neurologist, she has only been getting her medications through the emergency room when she gets her seizures. The patient claims that her seizures generally occur 2-3 times a week but over the last month they have become daily, she is having 2-3 events a day now. The patient states that the seizures are associated with sudden blackout, she may have some dizziness and headache before the onset of the seizure. She claims that she will bite her tongue, have generalized jerking and may have some urinary or bowel incontinence. She may occasionally hit her head when she has a seizure. She reports some bilateral hand numbness at times and some weakness in the low back. She has been placed on Keppra and the dose was recently increased after ER visit on 08 March 2020. She claims that on Keppra her seizures seem to be getting worse. She has undergone a CT scan of the brain has been unremarkable, she has never had an EEG study or an MRI study. She is sent to this office for an evaluation.    REVIEW OF SYSTEMS: Out of a complete 14 system review of symptoms, the patient complains only of the following symptoms, and all other reviewed systems are negative.  ALLERGIES: Allergies  Allergen Reactions  . Peanut-Containing Drug Products Anaphylaxis    Stomach bubbles with  Peanut oil only  (Patient states that she is not allergic to this)  . Latex   . Soy Allergy     hives  . Tomato     Throat swelling, shortness of breath     HOME MEDICATIONS: Outpatient Medications Prior to Visit  Medication Sig Dispense Refill  . ALPRAZolam (XANAX) 0.5 MG tablet Take 2 tablets approximately 45 minutes prior to the MRI study, take a third tablet if needed. 3 tablet 0  . folic acid (FOLVITE) 1 MG tablet Take 1 tablet (1 mg total) by mouth daily. 90 tablet 3  . levETIRAcetam (KEPPRA) 750 MG tablet Take 1 tablet (750 mg total) by mouth 2 (two) times daily. 60 tablet 1   No facility-administered medications prior to visit.    PAST MEDICAL HISTORY: Past Medical History:  Diagnosis Date  . ADHD (attention deficit hyperactivity disorder)   . Anxiety   . Depression   . Headache(784.0)   . Obesity   . Seizures (HCC)    last seizure 08/29/19  . Seizures (HCC)     PAST SURGICAL HISTORY: Past Surgical History:  Procedure Laterality Date  . ADENOIDECTOMY    . ANKLE SURGERY    . ANKLE SURGERY  at 25yo   Pt. reports to lengthen her tendons  . TONSILLECTOMY      FAMILY HISTORY: Family History  Problem Relation Age of Onset  . Bipolar disorder Mother   . Cancer Mother   . Diabetes Mother   . Hypertension Mother   .  Seizures Mother   . Hypertension Father   . Stroke Father   . Seizures Father   . Seizures Brother     SOCIAL HISTORY: Social History   Socioeconomic History  . Marital status: Married    Spouse name: Enid Derry  . Number of children: Not on file  . Years of education: Not on file  . Highest education level: Not on file  Occupational History  . Occupation: Full Time    Comment: [employer]  Tobacco Use  . Smoking status: Former Smoker    Packs/day: 0.10    Types: Cigarettes    Quit date: 01/13/2016    Years since quitting: 4.4  . Smokeless tobacco: Never Used  Vaping Use  . Vaping Use: Never used  Substance and Sexual Activity  . Alcohol use:  No  . Drug use: Yes    Frequency: 7.0 times per week    Types: Marijuana  . Sexual activity: Never    Comment: reports she is "gay", has had a boyfriend in past, but states she is only interested in girls, does not want mom to know  Other Topics Concern  . Not on file  Social History Narrative   Lives with Enid Derry and his mom and dad   Left Handed   Drinks 7-8 cups caffeine   Social Determinants of Health   Financial Resource Strain: Not on file  Food Insecurity: Not on file  Transportation Needs: Not on file  Physical Activity: Not on file  Stress: Not on file  Social Connections: Not on file  Intimate Partner Violence: Not on file      PHYSICAL EXAM  There were no vitals filed for this visit. There is no height or weight on file to calculate BMI.  Generalized: Well developed, in no acute distress   Neurological examination  Mentation: Alert oriented to time, place, history taking. Follows all commands speech and language fluent Cranial nerve II-XII: Pupils were equal round reactive to light. Extraocular movements were full, visual field were full on confrontational test. Facial sensation and strength were normal. Uvula tongue midline. Head turning and shoulder shrug  were normal and symmetric. Motor: The motor testing reveals 5 over 5 strength of all 4 extremities. Good symmetric motor tone is noted throughout.  Sensory: Sensory testing is intact to soft touch on all 4 extremities. No evidence of extinction is noted.  Coordination: Cerebellar testing reveals good finger-nose-finger and heel-to-shin bilaterally.  Gait and station: Gait is normal. Tandem gait is normal. Romberg is negative. No drift is seen.  Reflexes: Deep tendon reflexes are symmetric and normal bilaterally.   DIAGNOSTIC DATA (LABS, IMAGING, TESTING) - I reviewed patient records, labs, notes, testing and imaging myself where available.  Lab Results  Component Value Date   WBC 7.6 03/08/2020   HGB 16.1  (H) 03/08/2020   HCT 48.8 (H) 03/08/2020   MCV 91.9 03/08/2020   PLT 298 03/08/2020      Component Value Date/Time   NA 138 03/08/2020 1950   K 4.0 03/08/2020 1950   CL 105 03/08/2020 1950   CO2 26 03/08/2020 1950   GLUCOSE 87 03/08/2020 1950   BUN 14 03/08/2020 1950   CREATININE 0.74 03/08/2020 1950   CALCIUM 9.1 03/08/2020 1950   PROT 6.5 02/23/2020 1538   ALBUMIN 3.7 02/23/2020 1538   AST 18 02/23/2020 1538   ALT 21 02/23/2020 1538   ALKPHOS 60 02/23/2020 1538   BILITOT 0.7 02/23/2020 1538   GFRNONAA >60 03/08/2020 1950  GFRAA >60 11/02/2019 1633   No results found for: CHOL, HDL, LDLCALC, LDLDIRECT, TRIG, CHOLHDL No results found for: PYKD9I No results found for: VITAMINB12 Lab Results  Component Value Date   TSH 1.970 07/27/2018      ASSESSMENT AND PLAN 25 y.o. year old female  has a past medical history of ADHD (attention deficit hyperactivity disorder), Anxiety, Depression, Headache(784.0), Obesity, Seizures (HCC), and Seizures (HCC). here with ***   I spent 15 minutes with the patient. 50% of this time was spent   Margie Ege, [place], DNP 06/12/2020, 5:34 AM [facility] [address] [place], [place] [address] [phone]

## 2020-08-08 ENCOUNTER — Encounter (HOSPITAL_COMMUNITY): Payer: Self-pay | Admitting: *Deleted

## 2020-08-08 ENCOUNTER — Emergency Department (HOSPITAL_COMMUNITY): Payer: Medicaid Other

## 2020-08-08 ENCOUNTER — Other Ambulatory Visit: Payer: Self-pay

## 2020-08-08 ENCOUNTER — Emergency Department (HOSPITAL_COMMUNITY)
Admission: EM | Admit: 2020-08-08 | Discharge: 2020-08-08 | Disposition: A | Discharge status: Home / Self Care | Payer: Medicaid Other | Attending: Emergency Medicine | Admitting: Emergency Medicine

## 2020-08-08 DIAGNOSIS — Z3A01 Less than 8 weeks gestation of pregnancy: Secondary | ICD-10-CM | POA: Insufficient documentation

## 2020-08-08 DIAGNOSIS — Z3491 Encounter for supervision of normal pregnancy, unspecified, first trimester: Secondary | ICD-10-CM

## 2020-08-08 DIAGNOSIS — Z91040 Latex allergy status: Secondary | ICD-10-CM | POA: Diagnosis not present

## 2020-08-08 DIAGNOSIS — O26891 Other specified pregnancy related conditions, first trimester: Secondary | ICD-10-CM | POA: Insufficient documentation

## 2020-08-08 DIAGNOSIS — O219 Vomiting of pregnancy, unspecified: Secondary | ICD-10-CM | POA: Insufficient documentation

## 2020-08-08 DIAGNOSIS — R1030 Lower abdominal pain, unspecified: Secondary | ICD-10-CM | POA: Diagnosis not present

## 2020-08-08 DIAGNOSIS — Z91010 Allergy to peanuts: Secondary | ICD-10-CM | POA: Diagnosis not present

## 2020-08-08 DIAGNOSIS — Z87891 Personal history of nicotine dependence: Secondary | ICD-10-CM | POA: Insufficient documentation

## 2020-08-08 LAB — COMPREHENSIVE METABOLIC PANEL
ALT: 15 U/L (ref 0–44)
AST: 16 U/L (ref 15–41)
Albumin: 4.4 g/dL (ref 3.5–5.0)
Alkaline Phosphatase: 57 U/L (ref 38–126)
Anion gap: 9 (ref 5–15)
BUN: 10 mg/dL (ref 6–20)
CO2: 23 mmol/L (ref 22–32)
Calcium: 9.4 mg/dL (ref 8.9–10.3)
Chloride: 103 mmol/L (ref 98–111)
Creatinine, Ser: 0.7 mg/dL (ref 0.44–1.00)
GFR, Estimated: >60 mL/min (ref >60)
Glucose, Bld: 76 mg/dL (ref 70–99)
Potassium: 3.5 mmol/L (ref 3.5–5.1)
Sodium: 135 mmol/L (ref 135–145)
Total Bilirubin: 0.9 mg/dL (ref 0.3–1.2)
Total Protein: 7.8 g/dL (ref 6.5–8.1)

## 2020-08-08 LAB — CBC WITH DIFFERENTIAL/PLATELET
Abs Immature Granulocytes: 0.04 10*3/uL (ref 0.00–0.07)
Basophils Absolute: 0 10*3/uL (ref 0.0–0.1)
Basophils Relative: 0 %
Eosinophils Absolute: 0 10*3/uL (ref 0.0–0.5)
Eosinophils Relative: 0 %
HCT: 50.2 % — ABNORMAL HIGH (ref 36.0–46.0)
Hemoglobin: 16.9 g/dL — ABNORMAL HIGH (ref 12.0–15.0)
Immature Granulocytes: 0 %
Lymphocytes Relative: 17 %
Lymphs Abs: 1.6 10*3/uL (ref 0.7–4.0)
MCH: 30.5 pg (ref 26.0–34.0)
MCHC: 33.7 g/dL (ref 30.0–36.0)
MCV: 90.6 fL (ref 80.0–100.0)
Monocytes Absolute: 0.6 10*3/uL (ref 0.1–1.0)
Monocytes Relative: 6 %
Neutro Abs: 7.2 10*3/uL (ref 1.7–7.7)
Neutrophils Relative %: 77 %
Platelets: 255 10*3/uL (ref 150–400)
RBC: 5.54 MIL/uL — ABNORMAL HIGH (ref 3.87–5.11)
RDW: 12.1 % (ref 11.5–15.5)
WBC: 9.5 10*3/uL (ref 4.0–10.5)
nRBC: 0 % (ref 0.0–0.2)

## 2020-08-08 LAB — URINALYSIS, ROUTINE W REFLEX MICROSCOPIC
Bilirubin Urine: NEGATIVE
Glucose, UA: NEGATIVE mg/dL
Hgb urine dipstick: NEGATIVE
Ketones, ur: 80 mg/dL — AB
Leukocytes,Ua: NEGATIVE
Nitrite: NEGATIVE
Protein, ur: NEGATIVE mg/dL
Specific Gravity, Urine: 1.027 (ref 1.005–1.030)
pH: 6 (ref 5.0–8.0)

## 2020-08-08 LAB — HCG, QUANTITATIVE, PREGNANCY: hCG, Beta Chain, Quant, S: 53939 m[IU]/mL — ABNORMAL HIGH (ref <5)

## 2020-08-08 LAB — PREGNANCY, URINE: Preg Test, Ur: POSITIVE — AB

## 2020-08-08 MED ORDER — ONDANSETRON HCL 4 MG/2ML IJ SOLN
4.0000 mg | Freq: Once | INTRAMUSCULAR | Status: AC
  Administered 2020-08-08: 4 mg via INTRAVENOUS
  Filled 2020-08-08: qty 2

## 2020-08-08 MED ORDER — HYDROMORPHONE HCL 1 MG/ML IJ SOLN
0.5000 mg | Freq: Once | INTRAMUSCULAR | Status: AC
  Administered 2020-08-08: 0.5 mg via INTRAVENOUS
  Filled 2020-08-08: qty 1

## 2020-08-08 MED ORDER — SODIUM CHLORIDE 0.9 % IV BOLUS
1000.0000 mL | Freq: Once | INTRAVENOUS | Status: AC
  Administered 2020-08-08: 1000 mL via INTRAVENOUS

## 2020-08-08 MED ORDER — DOXYLAMINE-PYRIDOXINE 10-10 MG PO TBEC: 1.0000 | DELAYED_RELEASE_TABLET | Freq: Two times a day (BID) | ORAL | 0 refills | Status: DC | PRN

## 2020-08-08 NOTE — ED Triage Notes (Signed)
Abdominal pain with vomiting x 3 days 

## 2020-08-08 NOTE — ED Provider Notes (Signed)
[facility] EMERGENCY DEPARTMENT Provider Note   CSN: [id number] Arrival date & time: 08/08/20  1158     History Chief Complaint  Patient presents with  . Abdominal Pain    Pamela Mccarty is a 25 y.o. female.  Patient c/o lower abdominal discomfort bilaterally and nausea/vomiting in past few days. Symptoms acute onset, moderate, crampy, dull, persistent, non radiating. Emesis neither bloody or bilious. No abd distension. Having normal bms. States last normal period ~ 2 weeks ago. Denies vaginal discharge or bleeding. No known ill contacts, bad food ingestion or recent travel. No recent new meds. No fever or chills. No faintness or dizziness. No dysuria.   The history is provided by the patient.  Abdominal Pain Associated symptoms: nausea and vomiting   Associated symptoms: no chest pain, no chills, no dysuria, no fever, no shortness of breath, no sore throat, no vaginal bleeding and no vaginal discharge        Past Medical History:  Diagnosis Date  . ADHD (attention deficit hyperactivity disorder)   . Anxiety   . Depression   . Headache(784.0)   . Obesity   . Seizures (HCC)    last seizure 08/29/19  . Seizures [facility])     Patient Active Problem List   Diagnosis Date Noted  . Seizure (HCC) 02/27/2018  . Opioid use disorder, severe, dependence (HCC) 12/10/2015  . Suicidal ideation   . 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 Attention-deficit hyperactivity disorder, 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  . Oppositional defiant disorder 06/28/2011    Past Surgical History:  Procedure Laterality Date  . ADENOIDECTOMY    . ANKLE SURGERY    . ANKLE SURGERY  at 25yo   Pt. reports to lengthen her tendons  . TONSILLECTOMY       OB History    Gravida  2   Para      Term      Preterm      AB  1   Living        SAB      IAB      Ectopic      Multiple      Live Births              Family History  Problem Relation  Age of Onset  . Bipolar disorder Mother   . Cancer Mother   . Diabetes Mother   . Hypertension Mother   . Seizures Mother   . Hypertension Father   . Stroke Father   . Seizures Father   . Seizures Brother     Social History   Tobacco Use  . Smoking status: Former Smoker    Packs/day: 0.10    Types: Cigarettes    Quit date: 01/13/2016    Years since quitting: 4.5  . Smokeless tobacco: Never Used  Vaping Use  . Vaping Use: Never used  Substance Use Topics  . Alcohol use: No  . Drug use: Yes    Frequency: 7.0 times per week    Types: Marijuana    Home Medications Prior to Admission medications   Medication Sig Start Date End Date Taking? Authorizing Provider  ALPRAZolam Prudy Feeler) 0.5 MG tablet Take 2 tablets approximately 45 minutes prior to the MRI study, take a third tablet if needed. 03/14/20   York Spaniel, MD  folic acid (FOLVITE) 1 MG tablet Take 1 tablet (1 mg total) by mouth daily. 03/14/20   York Spaniel, MD  levETIRAcetam (  KEPPRA) 750 MG tablet Take 1 tablet (750 mg total) by mouth 2 (two) times daily. 03/08/20   Sabas Sous, MD    Allergies    Peanut-containing drug products, Latex, Soy allergy, and Tomato  Review of Systems   Review of Systems  Constitutional: Negative for chills and fever.  HENT: Negative for sore throat.   Eyes: Negative for redness.  Respiratory: Negative for shortness of breath.   Cardiovascular: Negative for chest pain.  Gastrointestinal: Positive for abdominal pain, nausea and vomiting.  Endocrine: Negative for polyuria.  Genitourinary: Negative for dysuria, flank pain, vaginal bleeding and vaginal discharge.  Musculoskeletal: Negative for back pain and neck pain.  Skin: Negative for rash.  Neurological: Negative for headaches.  Hematological: Does not bruise/bleed easily.  Psychiatric/Behavioral: Negative for confusion.    Physical Exam Updated Vital Signs BP (!) 148/86   Pulse 85   Temp 98.6 F (37 C) (Oral)    Resp 18   SpO2 99%   Physical Exam Vitals and nursing note reviewed.  Constitutional:      Appearance: Normal appearance. She is well-developed.  HENT:     Head: Atraumatic.     Nose: Nose normal.     Mouth/Throat:     Mouth: Mucous membranes are moist.  Eyes:     General: No scleral icterus.    Conjunctiva/sclera: Conjunctivae normal.  Neck:     Trachea: No tracheal deviation.  Cardiovascular:     Rate and Rhythm: Normal rate and regular rhythm.     Pulses: Normal pulses.     Heart sounds: Normal heart sounds. No murmur heard. No friction rub. No gallop.   Pulmonary:     Effort: Pulmonary effort is normal. No respiratory distress.     Breath sounds: Normal breath sounds.  Abdominal:     General: Bowel sounds are normal. There is no distension.     Palpations: Abdomen is soft. There is no mass.     Tenderness: There is no abdominal tenderness. There is no guarding or rebound.     Hernia: No hernia is present.  Genitourinary:    Comments: No cva tenderness.  Musculoskeletal:        General: No swelling.     Cervical back: Normal range of motion and neck supple. No rigidity. No muscular tenderness.  Skin:    General: Skin is warm and dry.     Findings: No rash.  Neurological:     Mental Status: She is alert.     Comments: Alert, speech normal.   Psychiatric:        Mood and Affect: Mood normal.     ED Results / Procedures / Treatments   Labs (all labs ordered are listed, but only abnormal results are displayed) Results for orders placed or performed during the hospital encounter of 08/08/20  Urinalysis, Routine w reflex microscopic Urine, Clean Catch  Result Value Ref Range   Color, Urine AMBER (A) YELLOW   APPearance CLOUDY (A) CLEAR   Specific Gravity, Urine 1.027 1.005 - 1.030   pH 6.0 5.0 - 8.0   Glucose, UA NEGATIVE NEGATIVE mg/dL   Hgb urine dipstick NEGATIVE NEGATIVE   Bilirubin Urine NEGATIVE NEGATIVE   Ketones, ur 80 (A) NEGATIVE mg/dL   Protein, ur  NEGATIVE NEGATIVE mg/dL   Nitrite NEGATIVE NEGATIVE   Leukocytes,Ua NEGATIVE NEGATIVE  Pregnancy, urine  Result Value Ref Range   Preg Test, Ur POSITIVE (A) NEGATIVE  CBC with Differential/Platelet  Result Value Ref Range  WBC 9.5 4.0 - 10.5 K/uL   RBC 5.54 (H) 3.87 - 5.11 MIL/uL   Hemoglobin 16.9 (H) 12.0 - 15.0 g/dL   HCT 08.6 (H) 57.8 - 46.9 %   MCV 90.6 80.0 - 100.0 fL   MCH 30.5 26.0 - 34.0 pg   MCHC 33.7 30.0 - 36.0 g/dL   RDW 62.9 52.8 - 41.3 %   Platelets 255 150 - 400 K/uL   nRBC 0.0 0.0 - 0.2 %   Neutrophils Relative % 77 %   Neutro Abs 7.2 1.7 - 7.7 K/uL   Lymphocytes Relative 17 %   Lymphs Abs 1.6 0.7 - 4.0 K/uL   Monocytes Relative 6 %   Monocytes Absolute 0.6 0.1 - 1.0 K/uL   Eosinophils Relative 0 %   Eosinophils Absolute 0.0 0.0 - 0.5 K/uL   Basophils Relative 0 %   Basophils Absolute 0.0 0.0 - 0.1 K/uL   Immature Granulocytes 0 %   Abs Immature Granulocytes 0.04 0.00 - 0.07 K/uL  Comprehensive metabolic panel  Result Value Ref Range   Sodium 135 135 - 145 mmol/L   Potassium 3.5 3.5 - 5.1 mmol/L   Chloride 103 98 - 111 mmol/L   CO2 23 22 - 32 mmol/L   Glucose, Bld 76 70 - 99 mg/dL   BUN 10 6 - 20 mg/dL   Creatinine, Ser 2.44 0.44 - 1.00 mg/dL   Calcium 9.4 8.9 - 01.0 mg/dL   Total Protein 7.8 6.5 - 8.1 g/dL   Albumin 4.4 3.5 - 5.0 g/dL   AST 16 15 - 41 U/L   ALT 15 0 - 44 U/L   Alkaline Phosphatase 57 38 - 126 U/L   Total Bilirubin 0.9 0.3 - 1.2 mg/dL   GFR, Estimated >27 >25 mL/min   Anion gap 9 5 - 15    EKG None  Radiology No results found.  Procedures Procedures   Medications Ordered in ED Medications  sodium chloride 0.9 % bolus 1,000 mL (1,000 mLs Intravenous New Bag/Given 08/08/20 1538)  HYDROmorphone (DILAUDID) injection 0.5 mg (0.5 mg Intravenous Given 08/08/20 1538)  ondansetron (ZOFRAN) injection 4 mg (4 mg Intravenous Given 08/08/20 1538)    ED Course  I have reviewed the triage vital signs and the nursing notes.  Pertinent  labs & imaging results that were available during my care of the patient were reviewed by me and considered in my medical decision making (see chart for details).    MDM Rules/Calculators/A&P                          Iv ns bolus. zofran iv. Dilaudid iv. Labs sent.   Reviewed nursing notes and prior charts for additional history.   Labs reviewed/interpreted by me - wbc normal.   Additional labs reviewed/interpreted by me  - u preg positive. Will check quant test, and get u/s.   Plan on pelvic exam - discussed w pt - she indicates already had transvag u/s and does not want further exam/declines pelvic. On exam, no abd or pelvic tenderness. Pt afebrile.   Pt currently appears stable for d/c.   Discussed u/s and need gyn f/u.   Is tolerating po in ED without further emesis.   Return precautions provided.     Final Clinical Impression(s) / ED Diagnoses Final diagnoses:  None    Rx / DC Orders ED Discharge Orders    None       Cathren Laine, MD  08/08/20 1730  

## 2020-08-08 NOTE — ED Provider Notes (Signed)
Patient placed in Quick Look pathway, seen and evaluated   Chief Complaint: abdominal pain  HPI: Pt reports vomiting and lower abdominal pain     ROS: no uti symptoms   Physical Exam:   Gen: No distress  Neuro: Awake and Alert  Skin: Warm    Focused Exam: WDWN 25 yo  Tender lower abdomen    Initiation of care has begun. The patient has been counseled on the process, plan, and necessity for staying for the completion/evaluation, and the remainder of the medical screening examination    Osie Cheeks 08/08/20 1305    Bethann Berkshire, MD 08/09/20 1017

## 2020-08-08 NOTE — Discharge Instructions (Addendum)
It was our pleasure to provide your ER care today - we hope that you feel better.  Rest. Drink plenty of fluids. Take prenatal vitamin.   You may take diclegis as need for nausea - may make drowsy, no driving when taking.   Follow up with ob/gyn doctor in the coming week - call office tomorrow AM to arrange appointment.   Return to ER if worse, new symptoms, fevers, persistent vomiting, new or severe abdominal pain, or other concern.   You were given pain medication in the ER - no driving for the next 6 hours.

## 2020-08-19 ENCOUNTER — Emergency Department (HOSPITAL_COMMUNITY)
Admission: EM | Admit: 2020-08-19 | Discharge: 2020-08-19 | Discharge status: Against Medical Advice | Payer: Medicaid Other

## 2020-08-29 ENCOUNTER — Ambulatory Visit (INDEPENDENT_AMBULATORY_CARE_PROVIDER_SITE_OTHER): Payer: Medicaid Other | Admitting: Obstetrics & Gynecology

## 2020-08-29 ENCOUNTER — Other Ambulatory Visit (HOSPITAL_COMMUNITY)
Admission: RE | Admit: 2020-08-29 | Discharge: 2020-08-29 | Disposition: A | Discharge status: Home / Self Care | Payer: Medicaid Other | Source: Ambulatory Visit | Attending: Obstetrics & Gynecology | Admitting: Obstetrics & Gynecology

## 2020-08-29 ENCOUNTER — Encounter: Payer: Self-pay | Admitting: Obstetrics & Gynecology

## 2020-08-29 ENCOUNTER — Other Ambulatory Visit: Payer: Self-pay

## 2020-08-29 VITALS — BP 125/72 | HR 91 | Wt 215.4 lb

## 2020-08-29 DIAGNOSIS — Z01419 Encounter for gynecological examination (general) (routine) without abnormal findings: Secondary | ICD-10-CM | POA: Diagnosis present

## 2020-08-29 DIAGNOSIS — R399 Unspecified symptoms and signs involving the genitourinary system: Secondary | ICD-10-CM | POA: Diagnosis not present

## 2020-08-29 DIAGNOSIS — O26899 Other specified pregnancy related conditions, unspecified trimester: Secondary | ICD-10-CM

## 2020-08-29 DIAGNOSIS — R102 Pelvic and perineal pain: Secondary | ICD-10-CM | POA: Diagnosis not present

## 2020-08-29 LAB — POCT URINALYSIS DIPSTICK OB
Blood, UA: NEGATIVE
Glucose, UA: NEGATIVE
Ketones, UA: NEGATIVE
Leukocytes, UA: NEGATIVE
Nitrite, UA: NEGATIVE
POC,PROTEIN,UA: NEGATIVE

## 2020-08-29 NOTE — Progress Notes (Signed)
   GYN VISIT Patient name: Pamela Mccarty MRN [id number]  Date of birth: 01-09-1996 Chief Complaint:   ER follow up/early pregnancy  History of Present Illness:   Pamela Mccarty is a 25 y.o. G2P0010 @ [redacted]w[redacted]d (EDC:12/24) female being seen today for ER follow up/early pregnancy.  Today she notes that this am she noted acute onset of sharp pain, lasted for a few min. Then resolved.  Denies vaginal bleeding.  Pain has since subsided.  She does note some discomfort with voiding.      Pt seen in ER on 08/08/20- records reviewed- UA/CBC wnl TVUS (08/08/20): Confirmed IUP @ [redacted]w[redacted]d, [id number], Small subchorionic hemorrhage noted  Pt very concerned about the pregnacny- per pt prior SAB due to trauma  Patient's last menstrual period was 06/24/2020 (exact date).  Depression screen [facility] 2/9 08/29/2020  Decreased Interest 0  Down, Depressed, Hopeless 0  PHQ - 2 Score 0  Altered sleeping 1  Tired, decreased energy 1  Change in appetite 0  Feeling bad or failure about yourself  0  Trouble concentrating 0  Moving slowly or fidgety/restless 0  Suicidal thoughts 0  PHQ-9 Score 2    Review of Systems:   Pertinent items are noted in HPI Denies fever/chills, dizziness, headaches, visual disturbances, fatigue, shortness of breath, chest pain, abdominal pain, some nausea, occasional vomiting, no problems with bowel movements unless outlined in HPI Pertinent History Reviewed:  Reviewed past medical,surgical, social, obstetrical and family history.  Reviewed problem list, medications and allergies. Physical Assessment:   Vitals:   08/29/20 1351  BP: 125/72  Pulse: 91  Weight: 215 lb 6.4 oz (97.7 kg)  Body mass index is 34.77 kg/m.       Physical Examination:   General appearance: alert, well appearing, and in no distress  Psych: mood appropriate, normal affect  Skin: warm & dry   Cardiovascular: normal heart rate noted  Breast: no abnormalities noted, nipples unremarkable  Respiratory: normal  respiratory effort, no distress  Abdomen: obese, soft, non-tender, no reproducible pain, no rebound, no guarding  Pelvic: normal external genitalia, vulva, vagina, cervix, uterus and adnexa  Extremities: no edema   Chaperone: Angel Neas    BSUS- confirmed viable IUP, +FHT noted  Assessment & Plan:  1) Early pregnancy -reassured pt of normal IUP -discussed SAB precautions -encouraged "healthy you"- balanced diet, avoiding drugs/EtOH and drinking plenty of water -pap collected  2) Nausea/vomiting -reviewed OTC remedies -encouraged Diclegis at night  3) Dysuria- will r/o underlying infection  Follow up as scheduled 6/13 for 1st OB appt   Orders Placed This Encounter  Procedures  . Urine Culture  . POC Urinalysis Dipstick OB    Return for Please cancel [place] 6/13, keep initial OB visit.   Myna Hidalgo, DO Attending Obstetrician & Gynecologist, [facility] for [employer], [facility]

## 2020-08-31 LAB — URINE CULTURE

## 2020-09-01 LAB — CYTOLOGY - PAP
Chlamydia: NEGATIVE
Comment: NEGATIVE
Comment: NORMAL
Diagnosis: NEGATIVE
Neisseria Gonorrhea: NEGATIVE

## 2020-09-05 ENCOUNTER — Other Ambulatory Visit: Payer: Self-pay

## 2020-09-05 ENCOUNTER — Encounter (HOSPITAL_COMMUNITY): Payer: Self-pay | Admitting: Emergency Medicine

## 2020-09-05 ENCOUNTER — Emergency Department (HOSPITAL_COMMUNITY)
Admission: EM | Admit: 2020-09-05 | Discharge: 2020-09-05 | Disposition: A | Discharge status: Home / Self Care | Payer: Medicaid Other | Attending: Emergency Medicine | Admitting: Emergency Medicine

## 2020-09-05 DIAGNOSIS — R519 Headache, unspecified: Secondary | ICD-10-CM | POA: Insufficient documentation

## 2020-09-05 DIAGNOSIS — R0789 Other chest pain: Secondary | ICD-10-CM | POA: Insufficient documentation

## 2020-09-05 DIAGNOSIS — Z3A10 10 weeks gestation of pregnancy: Secondary | ICD-10-CM | POA: Insufficient documentation

## 2020-09-05 DIAGNOSIS — O26891 Other specified pregnancy related conditions, first trimester: Secondary | ICD-10-CM | POA: Insufficient documentation

## 2020-09-05 DIAGNOSIS — Z91010 Allergy to peanuts: Secondary | ICD-10-CM | POA: Insufficient documentation

## 2020-09-05 DIAGNOSIS — R202 Paresthesia of skin: Secondary | ICD-10-CM | POA: Insufficient documentation

## 2020-09-05 DIAGNOSIS — Z87891 Personal history of nicotine dependence: Secondary | ICD-10-CM | POA: Insufficient documentation

## 2020-09-05 DIAGNOSIS — Z91040 Latex allergy status: Secondary | ICD-10-CM | POA: Insufficient documentation

## 2020-09-05 DIAGNOSIS — G40909 Epilepsy, unspecified, not intractable, without status epilepticus: Secondary | ICD-10-CM | POA: Insufficient documentation

## 2020-09-05 DIAGNOSIS — R569 Unspecified convulsions: Secondary | ICD-10-CM

## 2020-09-05 LAB — URINALYSIS, ROUTINE W REFLEX MICROSCOPIC
Bilirubin Urine: NEGATIVE
Glucose, UA: NEGATIVE mg/dL
Hgb urine dipstick: NEGATIVE
Ketones, ur: 20 mg/dL — AB
Leukocytes,Ua: NEGATIVE
Nitrite: NEGATIVE
Protein, ur: NEGATIVE mg/dL
Specific Gravity, Urine: 1.012 (ref 1.005–1.030)
pH: 9 — ABNORMAL HIGH (ref 5.0–8.0)

## 2020-09-05 LAB — CBC WITH DIFFERENTIAL/PLATELET
Abs Immature Granulocytes: 0.05 10*3/uL (ref 0.00–0.07)
Basophils Absolute: 0 10*3/uL (ref 0.0–0.1)
Basophils Relative: 0 %
Eosinophils Absolute: 0 10*3/uL (ref 0.0–0.5)
Eosinophils Relative: 0 %
HCT: 44.1 % (ref 36.0–46.0)
Hemoglobin: 14.8 g/dL (ref 12.0–15.0)
Immature Granulocytes: 0 %
Lymphocytes Relative: 12 %
Lymphs Abs: 1.3 10*3/uL (ref 0.7–4.0)
MCH: 30.4 pg (ref 26.0–34.0)
MCHC: 33.6 g/dL (ref 30.0–36.0)
MCV: 90.6 fL (ref 80.0–100.0)
Monocytes Absolute: 0.5 10*3/uL (ref 0.1–1.0)
Monocytes Relative: 4 %
Neutro Abs: 9.5 10*3/uL — ABNORMAL HIGH (ref 1.7–7.7)
Neutrophils Relative %: 84 %
Platelets: 218 10*3/uL (ref 150–400)
RBC: 4.87 MIL/uL (ref 3.87–5.11)
RDW: 12.3 % (ref 11.5–15.5)
WBC: 11.4 10*3/uL — ABNORMAL HIGH (ref 4.0–10.5)
nRBC: 0 % (ref 0.0–0.2)

## 2020-09-05 LAB — COMPREHENSIVE METABOLIC PANEL
ALT: 17 U/L (ref 0–44)
AST: 15 U/L (ref 15–41)
Albumin: 3.5 g/dL (ref 3.5–5.0)
Alkaline Phosphatase: 43 U/L (ref 38–126)
Anion gap: 6 (ref 5–15)
BUN: 9 mg/dL (ref 6–20)
CO2: 20 mmol/L — ABNORMAL LOW (ref 22–32)
Calcium: 8.2 mg/dL — ABNORMAL LOW (ref 8.9–10.3)
Chloride: 105 mmol/L (ref 98–111)
Creatinine, Ser: 0.52 mg/dL (ref 0.44–1.00)
GFR, Estimated: >60 mL/min (ref >60)
Glucose, Bld: 81 mg/dL (ref 70–99)
Potassium: 3.5 mmol/L (ref 3.5–5.1)
Sodium: 131 mmol/L — ABNORMAL LOW (ref 135–145)
Total Bilirubin: 0.7 mg/dL (ref 0.3–1.2)
Total Protein: 6.7 g/dL (ref 6.5–8.1)

## 2020-09-05 LAB — TROPONIN I (HIGH SENSITIVITY)
Troponin I (High Sensitivity): <2 ng/L (ref <18)
Troponin I (High Sensitivity): <2 ng/L (ref <18)

## 2020-09-05 MED ORDER — SODIUM CHLORIDE 0.9 % IV BOLUS
1000.0000 mL | Freq: Once | INTRAVENOUS | Status: AC
  Administered 2020-09-05: 1000 mL via INTRAVENOUS

## 2020-09-05 MED ORDER — ONDANSETRON HCL 4 MG/2ML IJ SOLN
4.0000 mg | Freq: Once | INTRAMUSCULAR | Status: AC
  Administered 2020-09-05: 4 mg via INTRAVENOUS
  Filled 2020-09-05: qty 2

## 2020-09-05 NOTE — Discharge Instructions (Signed)
Lab work and imaging are all reassuring.  Please continue all home medication as prescribed.  Please follow-up with your neurologist for further evaluation of your seizure disorder.  Please follow-up with your OB/GYN for further management of your pregnancy.  Come back to the emergency department if you develop chest pain, shortness of breath, severe abdominal pain, uncontrolled nausea, vomiting, diarrhea.

## 2020-09-05 NOTE — ED Provider Notes (Signed)
[facility] EMERGENCY DEPARTMENT Provider Note   CSN: [id number] Arrival date & time: 09/05/20  1255     History Chief Complaint  Patient presents with  . Seizures    Pamela Mccarty is a 25 y.o. female.  HPI    Patient with significant medical history of anxiety, depression, epilepsy, obesity, [redacted] weeks pregnant presents emergency department with chief complaint of seizure-like activities.  Patient states this morning she had bad morning sickness and was vomiting in the bathroom, she states that she felt like she passed out and does not remember anything after that.  She states that her boyfriend told her that she had a full body seizure which lasted approximately 20 minutes.  She did not bite her tongue or become incontinent.  Patient states she has not had a seizure in the last 4 weeks, states that she was currently on Keppra but had to discontinue this as she is pregnant at this time.  She is unsure whether she hit her head but is not on anticoagulant.  She states she has a headache full body numbness, and chest pressure but denies feeling short of breath, she has no cardiac history, no history of PEs or DVTs.  Patient denies  abdominal pain at this time, she denies pelvic cramping, vaginal discharge or vaginal bleeding.  After reviewing patient's chart she was seen at her neurologist in [place] is not formally diagnosed with a seizure disorder, underwent EEG which was unremarkable neurologist recommends prolonged EEG as well as MRI the patient is yet to perform these activities.  Patient received at her OB/GYN she had a TVUS 5/3 and confirmed IUP.   Past Medical History:  Diagnosis Date  . ADHD (attention deficit hyperactivity disorder)   . Anxiety   . Depression   . Epilepsy (HCC)   . Headache(784.0)   . Obesity   . Seizures (HCC)    last seizure 08/29/19  . Seizures [facility])     Patient Active Problem List   Diagnosis Date Noted  . Seizure (HCC) 02/27/2018  . Opioid use  disorder, severe, dependence (HCC) 12/10/2015  . Suicidal ideation   . 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 Attention-deficit hyperactivity disorder, 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  . Oppositional defiant disorder 06/28/2011    Past Surgical History:  Procedure Laterality Date  . ADENOIDECTOMY    . ANKLE SURGERY    . ANKLE SURGERY  at 25yo   Pt. reports to lengthen her tendons  . TONSILLECTOMY       OB History    Gravida  3   Para      Term      Preterm      AB  1   Living  0     SAB      IAB      Ectopic      Multiple      Live Births  0           Family History  Problem Relation Age of Onset  . Bipolar disorder Mother   . Cancer Mother   . Diabetes Mother   . Hypertension Mother   . Seizures Mother   . Hypertension Father   . Stroke Father   . Seizures Father   . Seizures Brother     Social History   Tobacco Use  . Smoking status: Former Smoker    Packs/day: 0.10    Types: Cigarettes    Quit date: 01/13/2016  Years since quitting: 4.6  . Smokeless tobacco: Never Used  Vaping Use  . Vaping Use: Never used  Substance Use Topics  . Alcohol use: No  . Drug use: Yes    Frequency: 7.0 times per week    Types: Marijuana    Home Medications Prior to Admission medications   Medication Sig Start Date End Date Taking? Authorizing Provider  Prenatal MV & Min w/FA-DHA ([employer] PRENATAL GUMMY PO) Take 150 mg by mouth 2 (two) times daily.   Yes [provider]  Doxylamine-Pyridoxine 10-10 MG TBEC Take 1-2 tablets by mouth 2 (two) times daily as needed (nausea/vomiting). 08/08/20   Cathren LaineSteinl, Kevin, MD  levETIRAcetam (KEPPRA) 750 MG tablet Take 1 tablet (750 mg total) by mouth 2 (two) times daily. Patient not taking: No sig reported 03/08/20   Sabas SousBero, Michael M, MD    Allergies    Peanut-containing drug products, Latex, Soy allergy, and Tomato  Review of Systems   Review of Systems  Constitutional:  Negative for chills and fever.  HENT: Negative for congestion.   Respiratory: Positive for chest tightness. Negative for shortness of breath.   Cardiovascular: Negative for chest pain.  Gastrointestinal: Negative for abdominal pain, nausea and vomiting.  Genitourinary: Negative for enuresis.  Musculoskeletal: Negative for back pain.  Skin: Negative for rash.  Neurological: Positive for numbness and headaches. Negative for dizziness.  Hematological: Does not bruise/bleed easily.    Physical Exam Updated Vital Signs BP (!) 130/99   Pulse 86   Temp 98.3 F (36.8 C) (Oral)   Resp 12   Ht 5\' 6"  (1.676 m)   Wt 97.5 kg   LMP 06/24/2020 (Exact Date)   SpO2 100%   BMI 34.70 kg/m   Physical Exam Vitals and nursing note reviewed.  Constitutional:      General: She is not in acute distress.    Appearance: She is not ill-appearing.  HENT:     Head: Normocephalic and atraumatic.     Nose: No congestion.  Eyes:     Extraocular Movements: Extraocular movements intact.     Conjunctiva/sclera: Conjunctivae normal.     Pupils: Pupils are equal, round, and reactive to light.  Cardiovascular:     Rate and Rhythm: Normal rate and regular rhythm.     Pulses: Normal pulses.     Heart sounds: No murmur heard. No friction rub. No gallop.   Pulmonary:     Effort: No respiratory distress.     Breath sounds: No wheezing, rhonchi or rales.  Abdominal:     Palpations: Abdomen is soft.     Tenderness: There is no abdominal tenderness.  Musculoskeletal:     Cervical back: Normal range of motion.     Right lower leg: No edema.     Left lower leg: No edema.     Comments: Patient has 5 of 5 strength, neurovascular tact in the upper lower extremities.  Skin:    General: Skin is warm and dry.  Neurological:     Mental Status: She is alert.     GCS: GCS eye subscore is 4. GCS verbal subscore is 5. GCS motor subscore is 6.     Sensory: Sensory deficit present.     Motor: No weakness.      Coordination: Romberg sign negative. Finger-Nose-Finger Test and Heel to [place] Test normal.     Comments: Cranial nerves II through XII grossly intact  Patient have no difficulty word finding.  Patient endorses decreased sensation in her  lower extremities  Psychiatric:        Mood and Affect: Mood normal.     ED Results / Procedures / Treatments   Labs (all labs ordered are listed, but only abnormal results are displayed) Labs Reviewed  COMPREHENSIVE METABOLIC PANEL - Abnormal; Notable for the following components:      Result Value   Sodium 131 (*)    CO2 20 (*)    Calcium 8.2 (*)    All other components within normal limits  CBC WITH DIFFERENTIAL/PLATELET - Abnormal; Notable for the following components:   WBC 11.4 (*)    Neutro Abs 9.5 (*)    All other components within normal limits  URINALYSIS, ROUTINE W REFLEX MICROSCOPIC - Abnormal; Notable for the following components:   APPearance HAZY (*)    pH 9.0 (*)    Ketones, ur 20 (*)    All other components within normal limits  TROPONIN I (HIGH SENSITIVITY)  TROPONIN I (HIGH SENSITIVITY)    EKG None  Radiology No results found.  Procedures Procedures   Medications Ordered in ED Medications  sodium chloride 0.9 % bolus 1,000 mL (0 mLs Intravenous Stopped 09/05/20 1721)  ondansetron (ZOFRAN) injection 4 mg (4 mg Intravenous Given 09/05/20 1513)    ED Course  I have reviewed the triage vital signs and the nursing notes.  Pertinent labs & imaging results that were available during my care of the patient were reviewed by me and considered in my medical decision making (see chart for details).    MDM Rules/Calculators/A&P                         Initial impression-patient presents with a seizure-like activities.  She is alert, does not appear in acute distress, vital signs reassuring.  Will obtain basic lab work-up, provide patient with fluids antiemetics and reassess.  Work-up-CBC shows slight leukocytosis of 11.4,  CMP shows slight hyponatremia 131, slight decrease in CO2 of 20, negative delta troponin, UA negative for nitrates leukocytes or hematuria.  Reassessment-patient was reassessed after fluids, she states she is feeling much better, she has no complaints this time will continue to monitor.  Patient  reassessed, she is tolerating p.o., states that her paresthesias has completely resolved and has no complaints.  Vital signs remained stable.  Patient is agreeable for discharge.  Consult -due to patient's history of seizure disorder will consult with neurology for further recommendations.  Spoke with Dr. Gerilyn Pilgrim  he does not recommend antiseizure medication at this time, or further imaging, he recommends follow-up at her outpatient neurologist.  Rule out-I have low suspicion for CVA or intracranial head bleed as there is no focal deficits present my exam.  Low suspicion for systemic infection as patient is nontoxic-appearing, vital signs reassuring. Low suspicion for meningitis as there is no meningeal sign present.  Low suspicion for ACS as EKG sinus without signs of ischemia, patient has a negative delta troponin.  Possible patient had a seizure but I found this unlikely as she not become incontinent, there is no tongue biting, she was not postictal.  Low suspicion for threatened abortion as patient denies abdominal pain, pelvic pain, no vaginal discharge or vaginal bleeding.  There is no the patient has a decreased CO2 I suspect this secondary due to dehydration.    Plan-  1.  Seizure-like activity since resolved-recommend that patient follows up with her neurologist for further evaluation.  2.  Pregnancy-recommend that she continues to follow-up  with her OB/GYN for further management.  Vital signs have remained stable, no indication for hospital admission.  Patient given at home care as well strict return precautions.  Patient verbalized that they understood agreed to said plan.   Final Clinical  Impression(s) / ED Diagnoses Final diagnoses:  Seizure-like activity [facility])    Rx / DC Orders ED Discharge Orders    None       Carroll Sage, PA-C 09/05/20 1740    Vanetta Mulders, MD 09/07/20 [phone]

## 2020-09-05 NOTE — ED Triage Notes (Signed)
Pt arrived by [facility]. Per Ems pt had a seizure at home witnessed by BF x20 minutes. Pt is [redacted] weeks pregnant and [facility] had the pt stop taking her keppra 4 weeks ago d/t her having a high risk pregnacy.   Pt is A/O x 4.  Pt received 4mg  zofran in route. IV established by EMS.

## 2020-09-05 NOTE — ED Notes (Signed)
Pt provided urine sample

## 2020-09-18 ENCOUNTER — Encounter: Payer: Self-pay | Admitting: Women's Health

## 2020-09-18 ENCOUNTER — Ambulatory Visit (INDEPENDENT_AMBULATORY_CARE_PROVIDER_SITE_OTHER): Payer: Medicaid Other | Admitting: Women's Health

## 2020-09-18 ENCOUNTER — Ambulatory Visit: Payer: Medicaid Other | Admitting: *Deleted

## 2020-09-18 ENCOUNTER — Other Ambulatory Visit: Payer: Medicaid Other

## 2020-09-18 ENCOUNTER — Other Ambulatory Visit: Payer: Self-pay

## 2020-09-18 VITALS — BP 124/78 | HR 77 | Wt 215.0 lb

## 2020-09-18 DIAGNOSIS — G40909 Epilepsy, unspecified, not intractable, without status epilepticus: Secondary | ICD-10-CM

## 2020-09-18 DIAGNOSIS — Z124 Encounter for screening for malignant neoplasm of cervix: Secondary | ICD-10-CM | POA: Diagnosis not present

## 2020-09-18 DIAGNOSIS — Z3480 Encounter for supervision of other normal pregnancy, unspecified trimester: Secondary | ICD-10-CM

## 2020-09-18 DIAGNOSIS — Z3490 Encounter for supervision of normal pregnancy, unspecified, unspecified trimester: Secondary | ICD-10-CM | POA: Insufficient documentation

## 2020-09-18 DIAGNOSIS — Z3A12 12 weeks gestation of pregnancy: Secondary | ICD-10-CM

## 2020-09-18 DIAGNOSIS — Z3481 Encounter for supervision of other normal pregnancy, first trimester: Secondary | ICD-10-CM

## 2020-09-18 DIAGNOSIS — F1120 Opioid dependence, uncomplicated: Secondary | ICD-10-CM

## 2020-09-18 DIAGNOSIS — F418 Other specified anxiety disorders: Secondary | ICD-10-CM | POA: Insufficient documentation

## 2020-09-18 DIAGNOSIS — Z131 Encounter for screening for diabetes mellitus: Secondary | ICD-10-CM

## 2020-09-18 DIAGNOSIS — Z113 Encounter for screening for infections with a predominantly sexual mode of transmission: Secondary | ICD-10-CM

## 2020-09-18 LAB — POCT URINALYSIS DIPSTICK OB
Blood, UA: NEGATIVE
Glucose, UA: NEGATIVE
Ketones, UA: NEGATIVE
Leukocytes, UA: NEGATIVE
Nitrite, UA: NEGATIVE
POC,PROTEIN,UA: NEGATIVE

## 2020-09-18 LAB — OB RESULTS CONSOLE GC/CHLAMYDIA: Gonorrhea: NEGATIVE

## 2020-09-18 MED ORDER — BLOOD PRESSURE MONITOR MISC: 0 refills | Status: DC

## 2020-09-18 MED ORDER — DOXYLAMINE-PYRIDOXINE 10-10 MG PO TBEC: DELAYED_RELEASE_TABLET | ORAL | 6 refills | Status: DC

## 2020-09-18 NOTE — Patient Instructions (Signed)
Arnett, thank you for choosing our office today! We appreciate the opportunity to meet your healthcare needs. You may receive a short survey by mail, e-mail, or through MyChart. If you are happy with your care we would appreciate if you could take just a few minutes to complete the survey questions. We read all of your comments and take your feedback very seriously. Thank you again for choosing our office.  °[facility] ° [facility]  °([address] [place], [place] [address]) °Entrance C, located off of [address] °Free 24/7 valet parking  ° Nausea & Vomiting °Have saltine crackers or pretzels by your bed and eat a few bites before you raise your head out of bed in the morning °Eat small frequent meals throughout the day instead of large meals °Drink plenty of fluids throughout the day to stay hydrated, just don't drink a lot of fluids with your meals.  This can make your stomach fill up faster making you feel sick °Do not brush your teeth right after you eat °Products with real ginger are good for nausea, like ginger ale and ginger hard candy Make sure it says made with real ginger! °Sucking on sour candy like lemon heads is also good for nausea °If your prenatal vitamins make you nauseated, take them at night so you will sleep through the nausea °Sea Bands °If you feel like you need medicine for the nausea & vomiting please let us know °If you are unable to keep any fluids or food down please let us know ° ° Constipation °Drink plenty of fluid, preferably water, throughout the day °Eat foods high in fiber such as fruits, vegetables, and grains °Exercise, such as walking, is a good way to keep your bowels regular °Drink warm fluids, especially warm prune juice, or decaf coffee °Eat a 1/2 cup of real oatmeal (not instant), 1/2 cup applesauce, and 1/2-1 cup warm prune juice every day °If needed, you may take Colace (docusate sodium) stool  softener once or twice a day to help keep the stool soft.  °If you still are having problems with constipation, you may take Miralax once daily as needed to help keep your bowels regular.  ° °Home Blood Pressure Monitoring for Patients  ° °Your provider has recommended that you check your blood pressure (BP) at least once a week at home. If you do not have a blood pressure cuff at home, one will be provided for you. Contact your provider if you have not received your monitor within 1 week.  ° °Helpful Tips for Accurate Home Blood Pressure Checks  °Don't smoke, exercise, or drink caffeine 30 minutes before checking your BP °Use the restroom before checking your BP (a full bladder can raise your pressure) °Relax in a comfortable upright chair °Feet on the ground °Left arm resting comfortably on a flat surface at the level of your heart °Legs uncrossed °Back supported °Sit quietly and don't talk °Place the cuff on your bare arm °Adjust snuggly, so that only two fingertips can fit between your skin and the top of the cuff °Check 2 readings separated by at least one minute °Keep a log of your BP readings °For a visual, please reference this diagram: http://ccnc.care/bpdiagram ° °Provider Name: [facility]     Phone: [phone] ° °Zone 1: ALL CLEAR  °Continue to monitor your symptoms:  °BP reading is less than 140 (top number) or less than 90 (bottom   number)  °No right upper stomach pain °No headaches or seeing spots °No feeling nauseated or throwing up °No swelling in face and hands ° °Zone 2: CAUTION °Call your doctor's office for any of the following:  °BP reading is greater than 140 (top number) or greater than 90 (bottom number)  °Stomach pain under your ribs in the middle or right side °Headaches or seeing spots °Feeling nauseated or throwing up °Swelling in face and hands ° °Zone 3: EMERGENCY  °Seek immediate medical care if you have any of the following:  °BP reading is greater than160 (top number) or  greater than 110 (bottom number) °Severe headaches not improving with Tylenol °Serious difficulty catching your breath °Any worsening symptoms from Zone 2  ° ° First Trimester of Pregnancy °The first trimester of pregnancy is from week 1 until the end of week 12 (months 1 through 3). A week after a sperm fertilizes an egg, the egg will implant on the wall of the uterus. This embryo will begin to develop into a baby. Genes from you and your partner are forming the baby. The female genes determine whether the baby is a boy or a girl. At 6-8 weeks, the eyes and face are formed, and the heartbeat can be seen on ultrasound. At the end of 12 weeks, all the baby's organs are formed.  °Now that you are pregnant, you will want to do everything you can to have a healthy baby. Two of the most important things are to get good prenatal care and to follow your health care provider's instructions. Prenatal care is all the medical care you receive before the baby's birth. This care will help prevent, find, and treat any problems during the pregnancy and childbirth. °BODY CHANGES °Your body goes through many changes during pregnancy. The changes vary from woman to woman.  °You may gain or lose a couple of pounds at first. °You may feel sick to your stomach (nauseous) and throw up (vomit). If the vomiting is uncontrollable, call your health care provider. °You may tire easily. °You may develop headaches that can be relieved by medicines approved by your health care provider. °You may urinate more often. Painful urination may mean you have a bladder infection. °You may develop heartburn as a result of your pregnancy. °You may develop constipation because certain hormones are causing the muscles that push waste through your intestines to slow down. °You may develop hemorrhoids or swollen, bulging veins (varicose veins). °Your breasts may begin to grow larger and become tender. Your nipples may stick out more, and the tissue that  surrounds them (areola) may become darker. °Your gums may bleed and may be sensitive to brushing and flossing. °Dark spots or blotches (chloasma, mask of pregnancy) may develop on your face. This will likely fade after the baby is born. °Your menstrual periods will stop. °You may have a loss of appetite. °You may develop cravings for certain kinds of food. °You may have changes in your emotions from day to day, such as being excited to be pregnant or being concerned that something may go wrong with the pregnancy and baby. °You may have more vivid and strange dreams. °You may have changes in your hair. These can include thickening of your hair, rapid growth, and changes in texture. Some women also have hair loss during or after pregnancy, or hair that feels dry or thin. Your hair will most likely return to normal after your baby is born. °WHAT TO EXPECT AT YOUR PRENATAL   VISITS °During a routine prenatal visit: °You will be weighed to make sure you and the baby are growing normally. °Your blood pressure will be taken. °Your abdomen will be measured to track your baby's growth. °The fetal heartbeat will be listened to starting around week 10 or 12 of your pregnancy. °Test results from any previous visits will be discussed. °Your health care provider may ask you: °How you are feeling. °If you are feeling the baby move. °If you have had any abnormal symptoms, such as leaking fluid, bleeding, severe headaches, or abdominal cramping. °If you have any questions. °Other tests that may be performed during your first trimester include: °Blood tests to find your blood type and to check for the presence of any previous infections. They will also be used to check for low iron levels (anemia) and Rh antibodies. Later in the pregnancy, blood tests for diabetes will be done along with other tests if problems develop. °Urine tests to check for infections, diabetes, or protein in the urine. °An ultrasound to confirm the proper growth  and development of the baby. °An amniocentesis to check for possible genetic problems. °Fetal screens for spina bifida and Down syndrome. °You may need other tests to make sure you and the baby are doing well. °HOME CARE INSTRUCTIONS  °Medicines °Follow your health care provider's instructions regarding medicine use. Specific medicines may be either safe or unsafe to take during pregnancy. °Take your prenatal vitamins as directed. °If you develop constipation, try taking a stool softener if your health care provider approves. °Diet °Eat regular, well-balanced meals. Choose a variety of foods, such as meat or vegetable-based protein, fish, milk and low-fat dairy products, vegetables, fruits, and whole grain breads and cereals. Your health care provider will help you determine the amount of weight gain that is right for you. °Avoid raw meat and uncooked cheese. These carry germs that can cause birth defects in the baby. °Eating four or five small meals rather than three large meals a day may help relieve nausea and vomiting. If you start to feel nauseous, eating a few soda crackers can be helpful. Drinking liquids between meals instead of during meals also seems to help nausea and vomiting. °If you develop constipation, eat more high-fiber foods, such as fresh vegetables or fruit and whole grains. Drink enough fluids to keep your urine clear or pale yellow. °Activity and Exercise °Exercise only as directed by your health care provider. Exercising will help you: °Control your weight. °Stay in shape. °Be prepared for labor and delivery. °Experiencing pain or cramping in the lower abdomen or low back is a good sign that you should stop exercising. Check with your health care provider before continuing normal exercises. °Try to avoid standing for long periods of time. Move your legs often if you must stand in one place for a long time. °Avoid heavy lifting. °Wear low-heeled shoes, and practice good posture. °You may  continue to have sex unless your health care provider directs you otherwise. °Relief of Pain or Discomfort °Wear a good support bra for breast tenderness.   °Take warm sitz baths to soothe any pain or discomfort caused by hemorrhoids. Use hemorrhoid cream if your health care provider approves.   °Rest with your legs elevated if you have leg cramps or low back pain. °If you develop varicose veins in your legs, wear support hose. Elevate your feet for 15 minutes, 3-4 times a day. Limit salt in your diet. °Prenatal Care °Schedule your prenatal visits by the   twelfth week of pregnancy. They are usually scheduled monthly at first, then more often in the last 2 months before delivery. °Write down your questions. Take them to your prenatal visits. °Keep all your prenatal visits as directed by your health care provider. °Safety °Wear your seat belt at all times when driving. °Make a list of emergency phone numbers, including numbers for family, friends, the hospital, and police and fire departments. °General Tips °Ask your health care provider for a referral to a local prenatal education class. Begin classes no later than at the beginning of month 6 of your pregnancy. °Ask for help if you have counseling or nutritional needs during pregnancy. Your health care provider can offer advice or refer you to specialists for help with various needs. °Do not use hot tubs, steam rooms, or saunas. °Do not douche or use tampons or scented sanitary pads. °Do not cross your legs for long periods of time. °Avoid cat litter boxes and soil used by cats. These carry germs that can cause birth defects in the baby and possibly loss of the fetus by miscarriage or stillbirth. °Avoid all smoking, herbs, alcohol, and medicines not prescribed by your health care provider. Chemicals in these affect the formation and growth of the baby. °Schedule a dentist appointment. At home, brush your teeth with a soft toothbrush and be gentle when you floss. °SEEK  MEDICAL CARE IF:  °You have dizziness. °You have mild pelvic cramps, pelvic pressure, or nagging pain in the abdominal area. °You have persistent nausea, vomiting, or diarrhea. °You have a bad smelling vaginal discharge. °You have pain with urination. °You notice increased swelling in your face, hands, legs, or ankles. °SEEK IMMEDIATE MEDICAL CARE IF:  °You have a fever. °You are leaking fluid from your vagina. °You have spotting or bleeding from your vagina. °You have severe abdominal cramping or pain. °You have rapid weight gain or loss. °You vomit blood or material that looks like coffee grounds. °You are exposed to German measles and have never had them. °You are exposed to fifth disease or chickenpox. °You develop a severe headache. °You have shortness of breath. °You have any kind of trauma, such as from a fall or a car accident. °Document Released: 03/19/2001 Document Revised: 08/09/2013 Document Reviewed: 02/02/2013 °ExitCare® Patient Information ©2015 [employer]. This information is not intended to replace advice given to you by your health care provider. Make sure you discuss any questions you have with your health care provider.  °

## 2020-09-18 NOTE — Progress Notes (Signed)
INITIAL OBSTETRICAL VISIT Patient name: Stepahnie Mccarty MRN [id number]  Date of birth: 16-Mar-1996 Chief Complaint:   Initial Prenatal Visit (nausea)  History of Present Illness:   Pamela Mccarty is a 25 y.o. G41P0020 Caucasian female at [redacted]w[redacted]d by LMP c/w u/s at 6 weeks with an Estimated Date of Delivery: 03/31/21 being seen today for her initial obstetrical visit.   Patient's last menstrual period was 06/24/2020 (exact date). Her obstetrical history is significant for  SAB x 2 .   Today she reports nausea, needs refill on diclegis. Seizures since childhood, treated only through ED, saw [name] in Dec 2021 to establish care, had a normal EEG, per his note: 'I suspect that most if not all of her seizure events may be psychogenic in nature'. Was on keppra 750mg  BID, stopped recently, states she feels it was making her seizures worse and she 'has something cheaper that works better'-referring to [facility], smokes 1 bowl/day and trying to wean down. Supposed to have a prolonged EEG, but hasn't scheduled yet, pt states she will do this today. Dep/anx- no meds x [facility], stopped w/ +PT. Has been on xanax, adderall, lexapro, prozac, intuniv, vistaril, lamictal, zoloft and trazodone in past (per med list in Epic). Doesn't recall what she was on most recently. Feels she's doing ok off meds currently. H/O suicidal ideation (per pt @ 20yo when mom passed away) H/O opioid use disorder, per pt not since 25yo  Last pap 08/29/20. Results were: NILM w/ HRHPV not done  Depression screen [facility] 2/9 09/18/2020 08/29/2020  Decreased Interest 0 0  Down, Depressed, Hopeless 0 0  PHQ - 2 Score 0 0  Altered sleeping 0 1  Tired, decreased energy 0 1  Change in appetite 0 0  Feeling bad or failure about yourself  0 0  Trouble concentrating 0 0  Moving slowly or fidgety/restless 0 0  Suicidal thoughts 0 0  PHQ-9 Score 0 2     GAD 7 : Generalized Anxiety Score 09/18/2020 08/29/2020  Nervous, Anxious, on Edge 0 0   Control/stop worrying 0 0  Worry too much - different things 0 0  Trouble relaxing 1 0  Restless 0 0  Easily annoyed or irritable 2 0  Afraid - awful might happen 0 0  Total GAD 7 Score 3 0     Review of Systems:   Pertinent items are noted in HPI Denies cramping/contractions, leakage of fluid, vaginal bleeding, abnormal vaginal discharge w/ itching/odor/irritation, headaches, visual changes, shortness of breath, chest pain, abdominal pain, severe nausea/vomiting, or problems with urination or bowel movements unless otherwise stated above.  Pertinent History Reviewed:  Reviewed past medical,surgical, social, obstetrical and family history.  Reviewed problem list, medications and allergies. OB History  Gravida Para Term Preterm AB Living  3       2 0  SAB IAB Ectopic Multiple Live Births  2       0    # Outcome Date GA Lbr Len/2nd Weight Sex Delivery Anes PTL Lv  3 Current           2 SAB           1 SAB            Physical Assessment:   Vitals:   09/18/20 0928  BP: 124/78  Pulse: 77  Weight: 215 lb (97.5 kg)  Body mass index is 34.7 kg/m.       Physical Examination:  General appearance - well appearing, and in  no distress  Mental status - alert, oriented to person, place, and time  Psych:  She has a normal mood and affect  Skin - warm and dry, normal color, no suspicious lesions noted  Chest - effort normal, all lung fields clear to auscultation bilaterally  Heart - normal rate and regular rhythm  Abdomen - soft, nontender  Extremities:  No swelling or varicosities noted  Thin prep pap is not done   Chaperone: N/A    TODAY'S FHR: 160 via doppler  Results for orders placed or performed in visit on 09/18/20 (from the past 24 hour(s))  POC Urinalysis Dipstick OB   Collection Time: 09/18/20  9:54 AM  Result Value Ref Range   Color, UA     Clarity, UA     Glucose, UA Negative Negative   Bilirubin, UA     Ketones, UA neg    Spec Grav, UA     Blood, UA neg     pH, UA     POC,PROTEIN,UA Negative Negative, Trace, Small (1+), Moderate (2+), Large (3+), 4+   Urobilinogen, UA     Nitrite, UA neg    Leukocytes, UA Negative Negative   Appearance     Odor      Assessment & Plan:  1) Low-Risk Pregnancy G3P0020 at [redacted]w[redacted]d with an Estimated Date of Delivery: 03/31/21   2) Initial OB visit  3) ?seizure disorder> read [name]' note 03/14/20; stopped keppra 750mg  BID (pt states was making worse), last seizure 2wks ago (3-4 at that time), plans to call Dr. today to schedule prolonged EEG.   4) Dep/anx> no meds currently (stopped ~23mth ago w/ +PT), doing well. H/O SI (after mother passed 36yr ago). Let 11yr know if feels she needs to restart  5) H/O opioid use disorder> @ 20yo when mother passed, no problems now  6) THC use> advised cessation, discussed potential long term learning disabilities  7) Nausea> refilled diclegis  Meds:  Meds ordered this encounter  Medications   Blood Pressure Monitor MISC    Sig: For regular home bp monitoring during pregnancy    Dispense:  1 each    Refill:  0    Z34.81 Please mail to patient   Doxylamine-Pyridoxine (DICLEGIS) 10-10 MG TBEC    Sig: 2 tabs q hs, if sx persist add 1 tab q am on day 3, if sx persist add 1 tab q afternoon on day 4    Dispense:  100 tablet    Refill:  6    Order Specific Question:   Supervising Provider    Answer:   [place] [[id number]]    Initial labs obtained Continue prenatal vitamins Reviewed n/v relief measures and warning s/s to report Reviewed recommended weight gain based on pre-gravid BMI Encouraged well-balanced diet Genetic & carrier screening discussed: requests Panorama, AFP, and Horizon , no nt u/s available Ultrasound discussed; fetal survey: requested CCNC completed> form faxed if has or is planning to apply for medicaid The nature of [facility] with multiple MDs and other Advanced Practice Providers was explained to patient;  also emphasized that fellows, residents, and students are part of our team. Does not have home bp cuff. Office bp cuff given: no. Rx sent: yes. Check bp weekly, let [employer] know if consistently >140/90.   Follow-up: Return in about 3 weeks (around 10/09/2020) for LROB, in person, AFP, MD only.   Orders Placed This Encounter  Procedures   Urine  Culture   GC/Chlamydia Probe Amp   Genetic Screening   Pain Management Screening Profile (10S)   CBC/D/Plt+RPR+Rh+ABO+RubIgG...   HgB A1c   Amb ref to [facility]   POC Urinalysis Dipstick OB    TATIANA COURTER CNM, [facility] 09/18/2020 11:31 AM

## 2020-09-19 ENCOUNTER — Encounter: Payer: Self-pay | Admitting: Women's Health

## 2020-09-19 ENCOUNTER — Telehealth: Payer: Self-pay | Admitting: Neurology

## 2020-09-19 DIAGNOSIS — R569 Unspecified convulsions: Secondary | ICD-10-CM

## 2020-09-19 DIAGNOSIS — F1290 Cannabis use, unspecified, uncomplicated: Secondary | ICD-10-CM | POA: Insufficient documentation

## 2020-09-19 LAB — CBC/D/PLT+RPR+RH+ABO+RUBIGG...
Antibody Screen: NEGATIVE
Basophils Absolute: 0 10*3/uL (ref 0.0–0.2)
Basos: 0 %
EOS (ABSOLUTE): 0.2 10*3/uL (ref 0.0–0.4)
Eos: 2 %
HCV Ab: <0.1 s/co ratio (ref 0.0–0.9)
HIV Screen 4th Generation wRfx: NONREACTIVE
Hematocrit: 42.5 % (ref 34.0–46.6)
Hemoglobin: 14.9 g/dL (ref 11.1–15.9)
Hepatitis B Surface Ag: NEGATIVE
Immature Grans (Abs): 0.1 10*3/uL (ref 0.0–0.1)
Immature Granulocytes: 1 %
Lymphocytes Absolute: 2.2 10*3/uL (ref 0.7–3.1)
Lymphs: 20 %
MCH: 30.5 pg (ref 26.6–33.0)
MCHC: 35.1 g/dL (ref 31.5–35.7)
MCV: 87 fL (ref 79–97)
Monocytes Absolute: 0.6 10*3/uL (ref 0.1–0.9)
Monocytes: 6 %
Neutrophils Absolute: 7.9 10*3/uL — ABNORMAL HIGH (ref 1.4–7.0)
Neutrophils: 71 %
Platelets: 252 10*3/uL (ref 150–450)
RBC: 4.88 x10E6/uL (ref 3.77–5.28)
RDW: 12.1 % (ref 11.7–15.4)
RPR Ser Ql: NONREACTIVE
Rh Factor: POSITIVE
Rubella Antibodies, IGG: 2.94 index (ref >0.99)
WBC: 11 10*3/uL — ABNORMAL HIGH (ref 3.4–10.8)

## 2020-09-19 LAB — HCV INTERPRETATION

## 2020-09-19 LAB — PMP SCREEN PROFILE (10S), URINE
Amphetamine Scrn, Ur: NEGATIVE ng/mL
BARBITURATE SCREEN URINE: NEGATIVE ng/mL
BENZODIAZEPINE SCREEN, URINE: NEGATIVE ng/mL
CANNABINOIDS UR QL SCN: POSITIVE ng/mL — AB
Cocaine (Metab) Scrn, Ur: NEGATIVE ng/mL
Creatinine(Crt), U: 346 mg/dL — ABNORMAL HIGH (ref 20.0–300.0)
Methadone Screen, Urine: NEGATIVE ng/mL
OXYCODONE+OXYMORPHONE UR QL SCN: NEGATIVE ng/mL
Opiate Scrn, Ur: NEGATIVE ng/mL
Ph of Urine: 5.8 (ref 4.5–8.9)
Phencyclidine Qn, Ur: NEGATIVE ng/mL
Propoxyphene Scrn, Ur: NEGATIVE ng/mL

## 2020-09-19 LAB — HEMOGLOBIN A1C
Est. average glucose Bld gHb Est-mCnc: 91 mg/dL
Hgb A1c MFr Bld: 4.8 % (ref 4.8–5.6)

## 2020-09-19 NOTE — Telephone Encounter (Signed)
We will cancel the MRI until after delivery.  The MRI was actually ordered in December 2021, well prior to her pregnancy, not clear why it took so long to get the MRI scheduled.

## 2020-09-19 NOTE — Addendum Note (Signed)
Addended by: York Spaniel on: 09/19/2020 06:15 PM   Modules accepted: Orders

## 2020-09-19 NOTE — Telephone Encounter (Signed)
Patient called me regarding MRI. She is going to have this done at [facility], but when she spoke to them they told her to check with MD regarding the order (MRI brain w/wo contrast). Patient is [redacted] weeks pregnant. Should we proceed with or without contrast?

## 2020-09-20 LAB — GC/CHLAMYDIA PROBE AMP
Chlamydia trachomatis, NAA: NEGATIVE
Neisseria Gonorrhoeae by PCR: NEGATIVE

## 2020-09-20 LAB — URINE CULTURE

## 2020-09-20 NOTE — Telephone Encounter (Signed)
Called patient and LVM for return call.  If patient returns call, please let her know [name] is cancelling the MRI until after she delivers.  She will need to call us back or be seen for new MRI order.  Thank you Cicero Duck, RN

## 2020-09-21 NOTE — Telephone Encounter (Signed)
LVM for return call. 

## 2020-09-21 NOTE — Telephone Encounter (Signed)
Patient returned call.  She was given [name]' recommendation to call back after she delivers for new MRI order.   If she has any additional concerns or questions, to please call back or she may make appointment for follow up.  Patient denied further questions, verbalized understanding and expressed appreciation for the phone call.

## 2020-10-10 ENCOUNTER — Encounter: Payer: Self-pay | Admitting: Women's Health

## 2020-10-10 ENCOUNTER — Other Ambulatory Visit: Payer: Self-pay

## 2020-10-10 ENCOUNTER — Ambulatory Visit (INDEPENDENT_AMBULATORY_CARE_PROVIDER_SITE_OTHER): Payer: Medicaid Other | Admitting: Women's Health

## 2020-10-10 VITALS — BP 120/77 | HR 77 | Wt 215.4 lb

## 2020-10-10 DIAGNOSIS — Z363 Encounter for antenatal screening for malformations: Secondary | ICD-10-CM

## 2020-10-10 DIAGNOSIS — G40909 Epilepsy, unspecified, not intractable, without status epilepticus: Secondary | ICD-10-CM

## 2020-10-10 DIAGNOSIS — Z3A15 15 weeks gestation of pregnancy: Secondary | ICD-10-CM

## 2020-10-10 DIAGNOSIS — Z3482 Encounter for supervision of other normal pregnancy, second trimester: Secondary | ICD-10-CM

## 2020-10-10 DIAGNOSIS — Z3480 Encounter for supervision of other normal pregnancy, unspecified trimester: Secondary | ICD-10-CM

## 2020-10-10 NOTE — Patient Instructions (Signed)
Pamela Mccarty, thank you for choosing our office today! We appreciate the opportunity to meet your healthcare needs. You may receive a short survey by mail, e-mail, or through [employer]. If you are happy with your care we would appreciate if you could take just a few minutes to complete the survey questions. We read all of your comments and take your feedback very seriously. Thank you again for choosing our office.  Center for [employer] Team at [name] at [facility] ([address] [place], [place] [address]) Entrance C, located off of [address] Free 24/7 valet parking  Go to [email or web address] to register for FREE online childbirth classes  Call the office [phone]) or go to [facility] if: You begin to severe cramping Your water breaks.  Sometimes it is a big gush of fluid, sometimes it is just a trickle that keeps getting your panties wet or running down your legs You have vaginal bleeding.  It is normal to have a small amount of spotting if your cervix was checked.   [facility] Pediatricians/Family Doctors [facility]): [address] Colette Ribas, [phone]           [facility]: [address], [phone]                [facility]): [address], [phone] (call to ask if accepting patients) [facility] Department: [address], [place], [phone]    [facility] Pediatricians/Family Doctors [facility]): [phone], [phone] [facility]: [address] [place], [phone] [facility] [place]: [address], [phone]  [facility] Doctors  [facility]): [phone] [facility]: [address] [phone]   [facility] Doctors [facility]: [address] [phone]  [facility] Doctors  [facility]: [phone], [phone]  Home Blood Pressure Monitoring for Patients   Your provider has recommended that you check your blood pressure (BP) at least once a week at home. If you do not have a blood pressure cuff at home, one will be provided for you. Contact your provider if you have not received your monitor within 1 week.   Helpful Tips for Accurate Home Blood Pressure Checks  Don't smoke, exercise, or drink caffeine 30 minutes before checking your BP Use the restroom before checking your BP (a full bladder can raise your pressure) Relax in a comfortable upright chair Feet on the ground Left arm resting comfortably on a flat surface at the level of your heart Legs uncrossed Back supported Sit quietly and don't talk Place the cuff on your bare arm Adjust snuggly, so that only two fingertips can fit between your skin and the top of the cuff Check 2 readings separated by at least one minute Keep a log of your BP readings For a visual, please reference this diagram: http://ccnc.care/bpdiagram  Provider Name: [facility]     Phone: [phone]  Zone 1: ALL CLEAR  Continue to monitor your symptoms:  BP reading is less than 140 (top number) or less than 90 (bottom number)  No right upper stomach pain No headaches or seeing spots No feeling nauseated or throwing up No swelling in face and hands  Zone 2: CAUTION Call your doctor's office for any of the following:  BP reading is greater than 140 (top number) or greater than  90 (bottom number)  Stomach pain under your ribs in the middle or right side Headaches or seeing spots Feeling nauseated or throwing up Swelling in face and hands  Zone 3: EMERGENCY  Seek immediate medical care if you have any of the following:  BP reading is greater than160 (top number) or greater than 110 (bottom number) Severe headaches not improving with Tylenol Serious difficulty catching your breath Any worsening symptoms from  Zone 2     Second Trimester of Pregnancy The second trimester is from week 14 through week 27 (months 4 through 6). The second trimester is often a time when you feel your best. Your body has adjusted to being pregnant, and you begin to feel better physically. Usually, morning sickness has lessened or quit completely, you may have more energy, and you may have an increase in appetite. The second trimester is also a time when the fetus is growing rapidly. At the end of the sixth month, the fetus is about 9 inches long and weighs about 1 pounds. You will likely begin to feel the baby move (quickening) between 16 and 20 weeks of pregnancy. Body changes during your second trimester Your body continues to go through many changes during your second trimester. The changes vary from woman to woman. Your weight will continue to increase. You will notice your lower abdomen bulging out. You may begin to get stretch marks on your hips, abdomen, and breasts. You may develop headaches that can be relieved by medicines. The medicines should be approved by your health care provider. You may urinate more often because the fetus is pressing on your bladder. You may develop or continue to have heartburn as a result of your pregnancy. You may develop constipation because certain hormones are causing the muscles that push waste through your intestines to slow down. You may develop hemorrhoids or swollen, bulging veins (varicose veins). You may have back pain. This is caused by: Weight gain. Pregnancy hormones that are relaxing the joints in your pelvis. A shift in weight and the muscles that support your balance. Your breasts will continue to grow and they will continue to become tender. Your gums may bleed and may be sensitive to brushing and flossing. Dark spots or blotches (chloasma, mask of pregnancy) may develop on your face. This will likely fade after the baby is born. A dark line from your belly button to  the pubic area (linea nigra) may appear. This will likely fade after the baby is born. You may have changes in your hair. These can include thickening of your hair, rapid growth, and changes in texture. Some women also have hair loss during or after pregnancy, or hair that feels dry or thin. Your hair will most likely return to normal after your baby is born.  What to expect at prenatal visits During a routine prenatal visit: You will be weighed to make sure you and the fetus are growing normally. Your blood pressure will be taken. Your abdomen will be measured to track your baby's growth. The fetal heartbeat will be listened to. Any test results from the previous visit will be discussed.  Your health care provider may ask you: How you are feeling. If you are feeling the baby move. If you have had any abnormal symptoms, such as leaking fluid, bleeding, severe headaches, or abdominal cramping. If you are using any tobacco products, including cigarettes, chewing tobacco, and electronic cigarettes. If you have any questions.  Other tests that may be performed during   your second trimester include: Blood tests that check for: Low iron levels (anemia). High blood sugar that affects pregnant women (gestational diabetes) between 24 and 28 weeks. Rh antibodies. This is to check for a protein on red blood cells (Rh factor). Urine tests to check for infections, diabetes, or protein in the urine. An ultrasound to confirm the proper growth and development of the baby. An amniocentesis to check for possible genetic problems. Fetal screens for spina bifida and Down syndrome. HIV (human immunodeficiency virus) testing. Routine prenatal testing includes screening for HIV, unless you choose not to have this test.  Follow these instructions at home: Medicines Follow your health care provider's instructions regarding medicine use. Specific medicines may be either safe or unsafe to take during  pregnancy. Take a prenatal vitamin that contains at least 600 micrograms (mcg) of folic acid. If you develop constipation, try taking a stool softener if your health care provider approves. Eating and drinking Eat a balanced diet that includes fresh fruits and vegetables, whole grains, good sources of protein such as meat, eggs, or tofu, and low-fat dairy. Your health care provider will help you determine the amount of weight gain that is right for you. Avoid raw meat and uncooked cheese. These carry germs that can cause birth defects in the baby. If you have low calcium intake from food, talk to your health care provider about whether you should take a daily calcium supplement. Limit foods that are high in fat and processed sugars, such as fried and sweet foods. To prevent constipation: Drink enough fluid to keep your urine clear or pale yellow. Eat foods that are high in fiber, such as fresh fruits and vegetables, whole grains, and beans. Activity Exercise only as directed by your health care provider. Most women can continue their usual exercise routine during pregnancy. Try to exercise for 30 minutes at least 5 days a week. Stop exercising if you experience uterine contractions. Avoid heavy lifting, wear low heel shoes, and practice good posture. A sexual relationship may be continued unless your health care provider directs you otherwise. Relieving pain and discomfort Wear a good support bra to prevent discomfort from breast tenderness. Take warm sitz baths to soothe any pain or discomfort caused by hemorrhoids. Use hemorrhoid cream if your health care provider approves. Rest with your legs elevated if you have leg cramps or low back pain. If you develop varicose veins, wear support hose. Elevate your feet for 15 minutes, 3-4 times a day. Limit salt in your diet. Prenatal Care Write down your questions. Take them to your prenatal visits. Keep all your prenatal visits as told by your health  care provider. This is important. Safety Wear your seat belt at all times when driving. Make a list of emergency phone numbers, including numbers for family, friends, the hospital, and police and fire departments. General instructions Ask your health care provider for a referral to a local prenatal education class. Begin classes no later than the beginning of month 6 of your pregnancy. Ask for help if you have counseling or nutritional needs during pregnancy. Your health care provider can offer advice or refer you to specialists for help with various needs. Do not use hot tubs, steam rooms, or saunas. Do not douche or use tampons or scented sanitary pads. Do not cross your legs for long periods of time. Avoid cat litter boxes and soil used by cats. These carry germs that can cause birth defects in the baby and possibly loss of the   fetus by miscarriage or stillbirth. Avoid all smoking, herbs, alcohol, and unprescribed drugs. Chemicals in these products can affect the formation and growth of the baby. Do not use any products that contain nicotine or tobacco, such as cigarettes and e-cigarettes. If you need help quitting, ask your health care provider. Visit your dentist if you have not gone yet during your pregnancy. Use a soft toothbrush to brush your teeth and be gentle when you floss. Contact a health care provider if: You have dizziness. You have mild pelvic cramps, pelvic pressure, or nagging pain in the abdominal area. You have persistent nausea, vomiting, or diarrhea. You have a bad smelling vaginal discharge. You have pain when you urinate. Get help right away if: You have a fever. You are leaking fluid from your vagina. You have spotting or bleeding from your vagina. You have severe abdominal cramping or pain. You have rapid weight gain or weight loss. You have shortness of breath with chest pain. You notice sudden or extreme swelling of your face, hands, ankles, feet, or legs. You  have not felt your baby move in over an hour. You have severe headaches that do not go away when you take medicine. You have vision changes. Summary The second trimester is from week 14 through week 27 (months 4 through 6). It is also a time when the fetus is growing rapidly. Your body goes through many changes during pregnancy. The changes vary from woman to woman. Avoid all smoking, herbs, alcohol, and unprescribed drugs. These chemicals affect the formation and growth your baby. Do not use any tobacco products, such as cigarettes, chewing tobacco, and e-cigarettes. If you need help quitting, ask your health care provider. Contact your health care provider if you have any questions. Keep all prenatal visits as told by your health care provider. This is important. This information is not intended to replace advice given to you by your health care provider. Make sure you discuss any questions you have with your health care provider. Document Released: 03/19/2001 Document Revised: 08/31/2015 Document Reviewed: 05/26/2012 Elsevier Interactive Patient Education  2017 Elsevier Inc.  

## 2020-10-10 NOTE — Progress Notes (Signed)
LOW-RISK PREGNANCY VISIT Patient name: Pamela Mccarty MRN [id number]  Date of birth: [birth year] Chief Complaint:   Routine Prenatal Visit  History of Present Illness:   Pamela Mccarty is a 25 y.o. G3P0020 female at [redacted]w[redacted]d with an Estimated Date of Delivery: 03/31/21 being seen today for ongoing management of a low-risk pregnancy.   Today she reports no complaints. No seizure activity since last visit, not taking keppra (makes her worse). Called to get MRI scheduled, per [name] can do pp. States she tried to make an appt w/ him but he was 'booked'. To call back and schedule appt.  Contractions: Not present.  .  Movement: Absent. denies leaking of fluid.  Depression screen [facility] 2/9 09/18/2020 08/29/2020  Decreased Interest 0 0  Down, Depressed, Hopeless 0 0  PHQ - 2 Score 0 0  Altered sleeping 0 1  Tired, decreased energy 0 1  Change in appetite 0 0  Feeling bad or failure about yourself  0 0  Trouble concentrating 0 0  Moving slowly or fidgety/restless 0 0  Suicidal thoughts 0 0  PHQ-9 Score 0 2     GAD 7 : Generalized Anxiety Score 09/18/2020 08/29/2020  Nervous, Anxious, on Edge 0 0  Control/stop worrying 0 0  Worry too much - different things 0 0  Trouble relaxing 1 0  Restless 0 0  Easily annoyed or irritable 2 0  Afraid - awful might happen 0 0  Total GAD 7 Score 3 0      Review of Systems:   Pertinent items are noted in HPI Denies abnormal vaginal discharge w/ itching/odor/irritation, headaches, visual changes, shortness of breath, chest pain, abdominal pain, severe nausea/vomiting, or problems with urination or bowel movements unless otherwise stated above. Pertinent History Reviewed:  Reviewed past medical,surgical, social, obstetrical and family history.  Reviewed problem list, medications and allergies. Physical Assessment:   Vitals:   10/10/20 0842  BP: 120/77  Pulse: 77  Weight: 215 lb 6.4 oz (97.7 kg)  Body mass index is 34.77 kg/m.        Physical  Examination:   General appearance: Well appearing, and in no distress  Mental status: Alert, oriented to person, place, and time  Skin: Warm & dry  Cardiovascular: Normal heart rate noted  Respiratory: Normal respiratory effort, no distress  Abdomen: Soft, gravid, nontender  Pelvic: Cervical exam deferred         Extremities: Edema: None  Fetal Status: Fetal Heart Rate (bpm): 148   Movement: Absent    Chaperone: N/A   No results found for this or any previous visit (from the past 24 hour(s)).  Assessment & Plan:  1) Low-risk pregnancy G3P0020 at [redacted]w[redacted]d with an Estimated Date of Delivery: 03/31/21   2) ?seizure disorder, not taking keppra (makes her worse), no seizure activity since last visit. MRI postponed til pp per neuro. Pt to call and make appt w/ [name] to discuss other med options   Meds: No orders of the defined types were placed in this encounter.  Labs/procedures today: AFP  Plan:  Continue routine obstetrical care  Next visit: prefers will be in person for u/s     Reviewed: Preterm labor symptoms and general obstetric precautions including but not limited to vaginal bleeding, contractions, leaking of fluid and fetal movement were reviewed in detail with the patient.  All questions were answered. Does have home bp cuff. Office bp cuff given: not applicable. Check bp weekly, let us know if consistently >  140 and/or >90.  Follow-up: Return in about 3 weeks (around 10/31/2020) for LROB, YT:KPTWSFK, CNM, in person.  No future appointments.  Orders Placed This Encounter  Procedures   US OB Comp + 14 Wk   AFP, Serum, Open Spina Bifida   FELCIA HUEBERT CNM, [facility] 10/10/2020 9:11 AM

## 2020-10-13 LAB — AFP, SERUM, OPEN SPINA BIFIDA
AFP MoM: 1.35
AFP Value: 34.8 ng/mL
Gest. Age on Collection Date: 15.4 weeks
Maternal Age At EDD: 23.3 yr
OSBR Risk 1 IN: 4151
Test Results:: NEGATIVE
Weight: 215 [lb_av]

## 2020-10-16 ENCOUNTER — Encounter (HOSPITAL_COMMUNITY): Payer: Self-pay | Admitting: *Deleted

## 2020-10-16 ENCOUNTER — Emergency Department (HOSPITAL_COMMUNITY)
Admission: EM | Admit: 2020-10-16 | Discharge: 2020-10-16 | Disposition: A | Discharge status: Home / Self Care | Payer: Medicaid Other | Attending: Emergency Medicine | Admitting: Emergency Medicine

## 2020-10-16 DIAGNOSIS — Z91010 Allergy to peanuts: Secondary | ICD-10-CM | POA: Insufficient documentation

## 2020-10-16 DIAGNOSIS — L02415 Cutaneous abscess of right lower limb: Secondary | ICD-10-CM | POA: Diagnosis present

## 2020-10-16 DIAGNOSIS — Z91040 Latex allergy status: Secondary | ICD-10-CM | POA: Diagnosis not present

## 2020-10-16 DIAGNOSIS — Z87891 Personal history of nicotine dependence: Secondary | ICD-10-CM | POA: Insufficient documentation

## 2020-10-16 DIAGNOSIS — L0291 Cutaneous abscess, unspecified: Secondary | ICD-10-CM

## 2020-10-16 MED ORDER — CEPHALEXIN 500 MG PO CAPS: 500.0000 mg | ORAL_CAPSULE | Freq: Four times a day (QID) | ORAL | 0 refills | Status: DC

## 2020-10-16 MED ORDER — CEPHALEXIN 500 MG PO CAPS
500.0000 mg | ORAL_CAPSULE | Freq: Once | ORAL | Status: AC
  Administered 2020-10-16: 500 mg via ORAL
  Filled 2020-10-16: qty 1

## 2020-10-16 MED ORDER — LIDOCAINE HCL (PF) 1 % IJ SOLN
5.0000 mL | Freq: Once | INTRAMUSCULAR | Status: AC
  Administered 2020-10-16: 5 mL
  Filled 2020-10-16: qty 30

## 2020-10-16 MED ORDER — POVIDONE-IODINE 10 % EX SOLN: CUTANEOUS | Status: DC | PRN

## 2020-10-16 NOTE — ED Provider Notes (Signed)
[facility] EMERGENCY DEPARTMENT Provider Note   CSN: [id number] Arrival date & time: 10/16/20  1603     History Chief Complaint  Patient presents with   Abscess    Pamela Mccarty is a 25 y.o. female.  The history is provided by the patient and the spouse.  Abscess Location:  Leg Leg abscess location:  R upper leg Abscess quality: draining, induration, painful and redness   Red streaking: no   Duration:  1 day Progression:  Worsening Pain details:    Quality:  Sharp   Severity:  Moderate   Duration:  1 day   Timing:  Constant   Progression:  Worsening Chronicity:  New Relieved by:  Nothing (although it has started to drain since applying warm soaks) Exacerbated by: direct pressure. Ineffective treatments:  None tried Associated symptoms: nausea   Associated symptoms: no fever and no vomiting   Associated symptoms comment:  Reports nausea but this is consistent with her morning sickness. She is currently [redacted] weeks pregnant, under the care of [facility].  She also describes feelings of pins and needles from her waist down involving entire bilateral legs to her feet which also started yesterday.  No abd pain, no vaginal bleeding or dc.   She denies back pain, no dysuria, no urinary or fecal incontinence or retention and no weakness in her legs.  Denies h/o back pain or injury.      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 reports to lengthen her tendons   TONSILLECTOMY       OB History     Gravida  3   Para      Term      Preterm      AB  2   Living  0      SAB  2   IAB      Ectopic      Multiple      Live Births  0           Family History  Problem Relation Age of Onset   Bipolar disorder Mother    Cancer Mother    Diabetes Mother    Hypertension Mother    Seizures Mother    Hypertension Father    Stroke Father    Seizures Father    Seizures Brother    Heart disease Maternal Grandfather    Heart attack Maternal Grandfather     Social History   Tobacco Use   Smoking status: Former    Packs/day: 0.10    Pack years: 0.00    Types: Cigarettes  Quit date: 01/13/2016    Years since quitting: 4.7   Smokeless tobacco: Never  Vaping Use   Vaping Use: Never used  Substance Use Topics   Alcohol use: No   Drug use: Yes    Frequency: 7.0 times per week    Types: Marijuana    Home Medications Prior to Admission medications   Medication Sig Start Date End Date Taking? Authorizing Provider  cephALEXin (KEFLEX) 500 MG capsule Take 1 capsule (500 mg total) by mouth 4 (four) times daily. 10/16/20  Yes IdolRaynelle Fanning, PA-C  Blood Pressure Monitor MISC For regular home bp monitoring during pregnancy 09/18/20   Cheral Marker, CNM  Doxylamine-Pyridoxine (DICLEGIS) 10-10 MG TBEC 2 tabs q hs, if sx persist add 1 tab q am on day 3, if sx persist add 1 tab q afternoon on day 4 09/18/20   Cheral Marker, CNM  levETIRAcetam (KEPPRA) 750 MG tablet Take 1 tablet (750 mg total) by mouth 2 (two) times daily. Patient not taking: No sig reported 03/08/20   Sabas Sous, MD  Prenatal MV & Min w/FA-DHA ([facility] PRENATAL GUMMY  PO) Take 150 mg by mouth 2 (two) times daily.    [provider]    Allergies    Peanut-containing drug products, Latex, Soy allergy, and Tomato  Review of Systems   Review of Systems  Constitutional: Negative.  Negative for fever.  HENT: Negative.    Respiratory: Negative.    Cardiovascular: Negative.   Gastrointestinal:  Positive for nausea. Negative for abdominal pain and vomiting.  Genitourinary: Negative.   Musculoskeletal:  Negative for back pain, gait problem and joint swelling.  Skin:        Negative except as mentioned in HPI.    Neurological:  Positive for numbness.   Physical Exam Updated Vital Signs BP (!) 142/73 (BP Location: Right Arm)   Pulse (!) 107   Temp 98.1 F (36.7 C) (Oral)   Resp 20   LMP 06/24/2020 (Exact Date)   SpO2 98%   Breastfeeding Unknown   Physical Exam Vitals and nursing note reviewed.  Constitutional:      Appearance: She is well-developed.  HENT:     Head: Normocephalic and atraumatic.     Mouth/Throat:     Mouth: Mucous membranes are moist.     Pharynx: Oropharynx is clear.  Eyes:     Conjunctiva/sclera: Conjunctivae normal.  Cardiovascular:     Rate and Rhythm: Normal rate and regular rhythm.     Heart sounds: Normal heart sounds.     Comments: Tachy in triage at 107, not on exam Pulmonary:     Effort: Pulmonary effort is normal.     Breath sounds: Normal breath sounds. No wheezing.  Abdominal:     General: Bowel sounds are normal. There is no distension.     Palpations: Abdomen is soft. There is no mass.     Tenderness: There is no abdominal tenderness. There is no guarding.  Musculoskeletal:        General: No tenderness. Normal range of motion.     Cervical back: Normal range of motion and neck supple.     Lumbar back: Normal. No swelling, edema, deformity or spasms.  Skin:    General: Skin is warm and dry.     Findings: Abscess and erythema present.     Comments: 3 cm diameter induration on right upper  medial thigh.  There is no red streaking.  There is a central punctum with  a small amount of purulent discharge.  The Band-Aid she presented with was completely soaked with purulent drainage.  Neurological:     Mental Status: She is alert.     Cranial Nerves: No cranial nerve deficit.     Sensory: No sensory deficit.     Motor: No weakness, tremor, atrophy or abnormal muscle tone.     Gait: Gait normal.     Deep Tendon Reflexes:     Reflex Scores:      Patellar reflexes are 2+ on the right side and 2+ on the left side.    Comments: No strength deficit noted in hip and knee flexor and extensor muscle groups.  Decreased sensation to fine touch bilateral lower extremities, however sharp touch is present.    ED Results / Procedures / Treatments   Labs (all labs ordered are listed, but only abnormal results are displayed) Labs Reviewed - No data to display  EKG None  Radiology No results found.  Procedures Procedures    INCISION AND DRAINAGE Performed by: Burgess Amor Consent: Verbal consent obtained. Risks and benefits: risks, benefits and alternatives were discussed Type: abscess  Body area: right medial thigh  Anesthesia: local infiltration  Incision was made with a scalpel.  Local anesthetic: lidocaine 1% without epinephrine  Anesthetic total: 3 ml  Complexity: complex Blunt dissection to break up loculations  Drainage: purulent  Drainage amount: small  Packing material: none Patient tolerance: Patient tolerated the procedure well with no immediate complications.   Medications Ordered in ED Medications  lidocaine (PF) (XYLOCAINE) 1 % injection 5 mL (has no administration in time range)  cephALEXin (KEFLEX) capsule 500 mg (has no administration in time range)    ED Course  I have reviewed the triage vital signs and the nursing notes.  Pertinent labs & imaging results that were available during my care of the patient were reviewed by me and considered in my  medical decision making (see chart for details).    MDM Rules/Calculators/A&P                          Pt with right upper thigh abscess, tolerated I&D without complication.  She was placed on Keflex, discussed other home treatments including warm water soaks..  Follow-up with her primary provider.  She has a completely normal neurologic exam except decreased to fine touch, sharp touch intact, denies low back pain, she is having described pins and needle sensation from the waist downward to her toes but no concerning findings on her exam today.  She is ambulatory.  DTRs are normal, pedal pulses are normal.  Reassurance given, advised follow-up with her PCP if symptoms persist, returning here for any worsening symptoms. Final Clinical Impression(s) / ED Diagnoses Final diagnoses:  Abscess    Rx / DC Orders ED Discharge Orders          Ordered    cephALEXin (KEFLEX) 500 MG capsule  4 times daily        10/16/20 1920             Burgess Amor, PA-C 10/16/20 1938    Mancel Bale, MD 10/17/20 [phone]

## 2020-10-16 NOTE — ED Triage Notes (Signed)
Abscess right inner thigh with abdominal cramps onset yesterday

## 2020-10-16 NOTE — Discharge Instructions (Addendum)
Take the entire course of the antibiotics prescribed, your next dose tomorrow morning.  Apply a warm water or tub soak to this infection site twice daily for 15 minutes.  Keep the site covered until the symptoms are improved and it is no longer draining.  Get rechecked for any persistent or worsening symptoms.

## 2020-10-16 NOTE — ED Notes (Signed)
Bandages given to pt to take with her home.

## 2020-11-01 ENCOUNTER — Ambulatory Visit (INDEPENDENT_AMBULATORY_CARE_PROVIDER_SITE_OTHER): Payer: Medicaid Other | Admitting: Advanced Practice Midwife

## 2020-11-01 ENCOUNTER — Other Ambulatory Visit: Payer: Self-pay

## 2020-11-01 ENCOUNTER — Encounter: Payer: Self-pay | Admitting: Advanced Practice Midwife

## 2020-11-01 ENCOUNTER — Ambulatory Visit (INDEPENDENT_AMBULATORY_CARE_PROVIDER_SITE_OTHER): Payer: Medicaid Other

## 2020-11-01 VITALS — BP 129/81 | HR 80 | Wt 218.0 lb

## 2020-11-01 DIAGNOSIS — Z3480 Encounter for supervision of other normal pregnancy, unspecified trimester: Secondary | ICD-10-CM

## 2020-11-01 DIAGNOSIS — Z3A18 18 weeks gestation of pregnancy: Secondary | ICD-10-CM | POA: Diagnosis not present

## 2020-11-01 DIAGNOSIS — Z363 Encounter for antenatal screening for malformations: Secondary | ICD-10-CM

## 2020-11-01 DIAGNOSIS — Z3482 Encounter for supervision of other normal pregnancy, second trimester: Secondary | ICD-10-CM

## 2020-11-01 NOTE — Patient Instructions (Signed)
[name], thank you for choosing our office today! We appreciate the opportunity to meet your healthcare needs. You may receive a short survey by mail, e-mail, or through [employer]. If you are happy with your care we would appreciate if you could take just a few minutes to complete the survey questions. We read all of your comments and take your feedback very seriously. Thank you again for choosing our office.  Center for [employer] Team at [name] at [facility] ([address] [place], [place] [address]) Entrance C, located off of [address] Free 24/7 valet parking  Go to [email or web address] to register for FREE online childbirth classes  Call the office [phone]) or go to [facility] if: You begin to severe cramping Your water breaks.  Sometimes it is a big gush of fluid, sometimes it is just a trickle that keeps getting your panties wet or running down your legs You have vaginal bleeding.  It is normal to have a small amount of spotting if your cervix was checked.   [facility] Pediatricians/Family Doctors [facility]): [address] Colette Ribas, [phone]           [facility]: [address], [phone]                [facility]): [address], [phone] (call to ask if accepting patients) [facility] Department: [address], [place], [phone]    [facility] Pediatricians/Family Doctors [facility]): [phone], [phone] [facility]: [address] [place], [phone] [facility] [place]: [address], [phone]  [facility] Doctors  [facility]): [phone] [facility]: [address] [phone]   [facility] Doctors [facility]: [address] [phone]  [facility] Doctors  [facility]: [phone], [phone]  Home Blood Pressure Monitoring for Patients   Your provider has recommended that you check your blood pressure (BP) at least once a week at home. If you do not have a blood pressure cuff at home, one will be provided for you. Contact your provider if you have not received your monitor within 1 week.   Helpful Tips for Accurate Home Blood Pressure Checks  Don't smoke, exercise, or drink caffeine 30 minutes before checking your BP Use the restroom before checking your BP (a full bladder can raise your pressure) Relax in a comfortable upright chair Feet on the ground Left arm resting comfortably on a flat surface at the level of your heart Legs uncrossed Back supported Sit quietly and don't talk Place the cuff on your bare arm Adjust snuggly, so that only two fingertips can fit between your skin and the top of the cuff Check 2 readings separated by at least one minute Keep a log of your BP readings For a visual, please reference this diagram: http://ccnc.care/bpdiagram  Provider Name: [facility]     Phone: [phone]  Zone 1: ALL CLEAR  Continue to monitor your symptoms:  BP reading is less than 140 (top number) or less than 90 (bottom number)  No right upper stomach pain No headaches or seeing spots No feeling nauseated or throwing up No swelling in face and hands  Zone 2: CAUTION Call your doctor's office for any of the following:  BP reading is greater than 140 (top number) or greater than  90 (bottom number)  Stomach pain under your ribs in the middle or right side Headaches or seeing spots Feeling nauseated or throwing up Swelling in face and hands  Zone 3: EMERGENCY  Seek immediate medical care if you have any of the following:  BP reading is greater than160 (top number) or greater than 110 (bottom number) Severe headaches not improving with Tylenol Serious difficulty catching your breath Any worsening symptoms from  Zone 2     Second Trimester of Pregnancy The second trimester is from week 14 through week 27 (months 4 through 6). The second trimester is often a time when you feel your best. Your body has adjusted to being pregnant, and you begin to feel better physically. Usually, morning sickness has lessened or quit completely, you may have more energy, and you may have an increase in appetite. The second trimester is also a time when the fetus is growing rapidly. At the end of the sixth month, the fetus is about 9 inches long and weighs about 1 pounds. You will likely begin to feel the baby move (quickening) between 16 and 20 weeks of pregnancy. Body changes during your second trimester Your body continues to go through many changes during your second trimester. The changes vary from woman to woman. Your weight will continue to increase. You will notice your lower abdomen bulging out. You may begin to get stretch marks on your hips, abdomen, and breasts. You may develop headaches that can be relieved by medicines. The medicines should be approved by your health care provider. You may urinate more often because the fetus is pressing on your bladder. You may develop or continue to have heartburn as a result of your pregnancy. You may develop constipation because certain hormones are causing the muscles that push waste through your intestines to slow down. You may develop hemorrhoids or swollen, bulging veins (varicose veins). You may have back pain. This is caused by: Weight gain. Pregnancy hormones that are relaxing the joints in your pelvis. A shift in weight and the muscles that support your balance. Your breasts will continue to grow and they will continue to become tender. Your gums may bleed and may be sensitive to brushing and flossing. Dark spots or blotches (chloasma, mask of pregnancy) may develop on your face. This will likely fade after the baby is born. A dark line from your belly button to  the pubic area (linea nigra) may appear. This will likely fade after the baby is born. You may have changes in your hair. These can include thickening of your hair, rapid growth, and changes in texture. Some women also have hair loss during or after pregnancy, or hair that feels dry or thin. Your hair will most likely return to normal after your baby is born.  What to expect at prenatal visits During a routine prenatal visit: You will be weighed to make sure you and the fetus are growing normally. Your blood pressure will be taken. Your abdomen will be measured to track your baby's growth. The fetal heartbeat will be listened to. Any test results from the previous visit will be discussed.  Your health care provider may ask you: How you are feeling. If you are feeling the baby move. If you have had any abnormal symptoms, such as leaking fluid, bleeding, severe headaches, or abdominal cramping. If you are using any tobacco products, including cigarettes, chewing tobacco, and electronic cigarettes. If you have any questions.  Other tests that may be performed during   your second trimester include: Blood tests that check for: Low iron levels (anemia). High blood sugar that affects pregnant women (gestational diabetes) between 24 and 28 weeks. Rh antibodies. This is to check for a protein on red blood cells (Rh factor). Urine tests to check for infections, diabetes, or protein in the urine. An ultrasound to confirm the proper growth and development of the baby. An amniocentesis to check for possible genetic problems. Fetal screens for spina bifida and Down syndrome. HIV (human immunodeficiency virus) testing. Routine prenatal testing includes screening for HIV, unless you choose not to have this test.  Follow these instructions at home: Medicines Follow your health care provider's instructions regarding medicine use. Specific medicines may be either safe or unsafe to take during  pregnancy. Take a prenatal vitamin that contains at least 600 micrograms (mcg) of folic acid. If you develop constipation, try taking a stool softener if your health care provider approves. Eating and drinking Eat a balanced diet that includes fresh fruits and vegetables, whole grains, good sources of protein such as meat, eggs, or tofu, and low-fat dairy. Your health care provider will help you determine the amount of weight gain that is right for you. Avoid raw meat and uncooked cheese. These carry germs that can cause birth defects in the baby. If you have low calcium intake from food, talk to your health care provider about whether you should take a daily calcium supplement. Limit foods that are high in fat and processed sugars, such as fried and sweet foods. To prevent constipation: Drink enough fluid to keep your urine clear or pale yellow. Eat foods that are high in fiber, such as fresh fruits and vegetables, whole grains, and beans. Activity Exercise only as directed by your health care provider. Most women can continue their usual exercise routine during pregnancy. Try to exercise for 30 minutes at least 5 days a week. Stop exercising if you experience uterine contractions. Avoid heavy lifting, wear low heel shoes, and practice good posture. A sexual relationship may be continued unless your health care provider directs you otherwise. Relieving pain and discomfort Wear a good support bra to prevent discomfort from breast tenderness. Take warm sitz baths to soothe any pain or discomfort caused by hemorrhoids. Use hemorrhoid cream if your health care provider approves. Rest with your legs elevated if you have leg cramps or low back pain. If you develop varicose veins, wear support hose. Elevate your feet for 15 minutes, 3-4 times a day. Limit salt in your diet. Prenatal Care Write down your questions. Take them to your prenatal visits. Keep all your prenatal visits as told by your health  care provider. This is important. Safety Wear your seat belt at all times when driving. Make a list of emergency phone numbers, including numbers for family, friends, the hospital, and police and fire departments. General instructions Ask your health care provider for a referral to a local prenatal education class. Begin classes no later than the beginning of month 6 of your pregnancy. Ask for help if you have counseling or nutritional needs during pregnancy. Your health care provider can offer advice or refer you to specialists for help with various needs. Do not use hot tubs, steam rooms, or saunas. Do not douche or use tampons or scented sanitary pads. Do not cross your legs for long periods of time. Avoid cat litter boxes and soil used by cats. These carry germs that can cause birth defects in the baby and possibly loss of the   fetus by miscarriage or stillbirth. Avoid all smoking, herbs, alcohol, and unprescribed drugs. Chemicals in these products can affect the formation and growth of the baby. Do not use any products that contain nicotine or tobacco, such as cigarettes and e-cigarettes. If you need help quitting, ask your health care provider. Visit your dentist if you have not gone yet during your pregnancy. Use a soft toothbrush to brush your teeth and be gentle when you floss. Contact a health care provider if: You have dizziness. You have mild pelvic cramps, pelvic pressure, or nagging pain in the abdominal area. You have persistent nausea, vomiting, or diarrhea. You have a bad smelling vaginal discharge. You have pain when you urinate. Get help right away if: You have a fever. You are leaking fluid from your vagina. You have spotting or bleeding from your vagina. You have severe abdominal cramping or pain. You have rapid weight gain or weight loss. You have shortness of breath with chest pain. You notice sudden or extreme swelling of your face, hands, ankles, feet, or legs. You  have not felt your baby move in over an hour. You have severe headaches that do not go away when you take medicine. You have vision changes. Summary The second trimester is from week 14 through week 27 (months 4 through 6). It is also a time when the fetus is growing rapidly. Your body goes through many changes during pregnancy. The changes vary from woman to woman. Avoid all smoking, herbs, alcohol, and unprescribed drugs. These chemicals affect the formation and growth your baby. Do not use any tobacco products, such as cigarettes, chewing tobacco, and e-cigarettes. If you need help quitting, ask your health care provider. Contact your health care provider if you have any questions. Keep all prenatal visits as told by your health care provider. This is important. This information is not intended to replace advice given to you by your health care provider. Make sure you discuss any questions you have with your health care provider. Document Released: 03/19/2001 Document Revised: 08/31/2015 Document Reviewed: 05/26/2012 Elsevier Interactive Patient Education  2017 [employer]  

## 2020-11-01 NOTE — Progress Notes (Signed)
   LOW-RISK PREGNANCY VISIT Patient name: Pamela Mccarty MRN [id number]  Date of birth: Dec 16, 1995 Chief Complaint:   Routine Prenatal Visit (Anatomy scan)  History of Present Illness:   Pamela Mccarty is a 25 y.o. G3P0020 female at [redacted]w[redacted]d with an Estimated Date of Delivery: 03/31/21 being seen today for ongoing management of a low-risk pregnancy.  Today she reports  intermittent abd cramping . Contractions: Not present. Vag. Bleeding: None.  Movement: Present. denies leaking of fluid. Review of Systems:   Pertinent items are noted in HPI Denies abnormal vaginal discharge w/ itching/odor/irritation, headaches, visual changes, shortness of breath, chest pain, abdominal pain, severe nausea/vomiting, or problems with urination or bowel movements unless otherwise stated above. Pertinent History Reviewed:  Reviewed past medical,surgical, social, obstetrical and family history.  Reviewed problem list, medications and allergies. Physical Assessment:   Vitals:   11/01/20 1157  BP: 129/81  Pulse: 80  Weight: 218 lb (98.9 kg)  Body mass index is 35.19 kg/m.        Physical Examination:   General appearance: Well appearing, and in no distress  Mental status: Alert, oriented to person, place, and time  Skin: Warm & dry  Cardiovascular: Normal heart rate noted  Respiratory: Normal respiratory effort, no distress  Abdomen: Soft, gravid, nontender  Pelvic: Cervical exam deferred         Extremities: Edema: None  Fetal Status: Fetal Heart Rate (bpm): 157 u/s   Movement: Present    Anatomy u/s: [place] 18+4 wks,anterior placenta gr 0,breech,cx 3.1 cm,fhr 157 bpm,svp of fluid 4.3 cm,anatomy complete,no obvious abnormalities,EFW 254 G 53%  No results found for this or any previous visit (from the past 24 hour(s)).  Assessment & Plan:  1) Low-risk pregnancy G3P0020 at [redacted]w[redacted]d with an Estimated Date of Delivery: 03/31/21   2) ? Seizure d/o, pt is following up w Dr Anne Hahn   Meds: No orders of the  defined types were placed in this encounter.  Labs/procedures today: anatomy u/s  Plan:  Continue routine obstetrical care   Reviewed: Preterm labor symptoms and general obstetric precautions including but not limited to vaginal bleeding, contractions, leaking of fluid and fetal movement were reviewed in detail with the patient.  All questions were answered. Still waiting on home bp cuff. Check bp weekly, let us know if >140/90.   Follow-up: Return in about 4 weeks (around 11/29/2020) for LROB, in person.  No orders of the defined types were placed in this encounter.  ROSEZETTA BALDERSTON CNM 11/01/2020 12:10 PM

## 2020-11-28 ENCOUNTER — Encounter: Payer: Medicaid Other | Admitting: Women's Health

## 2020-11-28 ENCOUNTER — Other Ambulatory Visit: Payer: Self-pay

## 2020-11-28 ENCOUNTER — Ambulatory Visit (INDEPENDENT_AMBULATORY_CARE_PROVIDER_SITE_OTHER): Payer: Medicaid Other | Admitting: Women's Health

## 2020-11-28 ENCOUNTER — Encounter: Payer: Self-pay | Admitting: Women's Health

## 2020-11-28 VITALS — BP 133/81 | HR 80 | Wt 220.0 lb

## 2020-11-28 DIAGNOSIS — Z3A22 22 weeks gestation of pregnancy: Secondary | ICD-10-CM

## 2020-11-28 DIAGNOSIS — Z3482 Encounter for supervision of other normal pregnancy, second trimester: Secondary | ICD-10-CM

## 2020-11-28 DIAGNOSIS — Z3480 Encounter for supervision of other normal pregnancy, unspecified trimester: Secondary | ICD-10-CM

## 2020-11-28 NOTE — Patient Instructions (Signed)
Pamela Mccarty, thank you for choosing our office today! We appreciate the opportunity to meet your healthcare needs. You may receive a short survey by mail, e-mail, or through [employer]. If you are happy with your care we would appreciate if you could take just a few minutes to complete the survey questions. We read all of your comments and take your feedback very seriously. Thank you again for choosing our office.  Center for [employer] Team at [facility]  [facility] at [facility] ([address] [place], [place] [address]) Entrance C, located off of [address] Free 24/7 valet parking   You will have your sugar test next visit.  Please do not eat or drink anything after midnight the night before you come, not even water.  You will be here for at least two hours.  Please make an appointment online for the bloodwork at [email or web address] for 8:00am (or as close to this as possible). Make sure you select the [facility] service center.   CLASSES: Go to [email or web address] to register for classes (childbirth, breastfeeding, waterbirth, infant CPR, daddy bootcamp, etc.)  Call the office [phone]) or go to [facility] if: You begin to have strong, frequent contractions Your water breaks.  Sometimes it is a big gush of fluid, sometimes it is just a trickle that keeps getting your panties wet or running down your legs You have vaginal bleeding.  It is normal to have a small amount of spotting if your cervix was checked.  You don't feel your baby moving like normal.  If you don't, get you something to eat and drink and lay down and focus on feeling your baby move.   If your baby is still not moving like normal, you should call the office or go to [facility].  Call the office [phone]) or go to [facility] hospital for these signs of pre-eclampsia: Severe headache that does not go away with Tylenol Visual changes- seeing spots, double, blurred vision Pain under your right breast or  upper abdomen that does not go away with Tums or heartburn medicine Nausea and/or vomiting Severe swelling in your hands, feet, and face    [facility] Pediatricians/Family Doctors  [facility]): [address] Colette Ribas, [phone]           [facility]: [address], [phone]                [facility]): [address], [phone] (call to ask if accepting patients) [facility] Department: [address], [place], [phone]    [facility] Pediatricians/Family Doctors [facility]): [address], [phone] [facility]: [address] [place], [phone] [facility]: [address], [phone]  [facility] Doctors  [facility]): [phone] [facility]: [address] [phone]   [facility] Doctors [facility]: [address], [phone]  [facility] Doctors  [facility]: [phone], [phone]  Home Blood Pressure Monitoring for Patients   Your provider has recommended that you check your blood pressure (BP) at least once a week at home. If you do not have a blood pressure cuff at home, one will be provided for you. Contact your provider if you have not received your monitor within 1 week.   Helpful Tips for Accurate Home Blood Pressure Checks  Don't smoke, exercise, or drink  caffeine 30 minutes before checking your BP Use the restroom before checking your BP (a full bladder can raise your pressure) Relax in a comfortable upright chair Feet on the ground Left arm resting comfortably on a flat surface at the level of your heart Legs uncrossed Back supported Sit quietly and don't talk Place the cuff on your bare arm Adjust snuggly, so that only two fingertips can fit between your skin and the top of the cuff Check 2  readings separated by at least one minute Keep a log of your BP readings For a visual, please reference this diagram: http://ccnc.care/bpdiagram  Provider Name: [facility]     Phone: [phone]  Zone 1: ALL CLEAR  Continue to monitor your symptoms:  BP reading is less than 140 (top number) or less than 90 (bottom number)  No right upper stomach pain No headaches or seeing spots No feeling nauseated or throwing up No swelling in face and hands  Zone 2: CAUTION Call your doctor's office for any of the following:  BP reading is greater than 140 (top number) or greater than 90 (bottom number)  Stomach pain under your ribs in the middle or right side Headaches or seeing spots Feeling nauseated or throwing up Swelling in face and hands  Zone 3: EMERGENCY  Seek immediate medical care if you have any of the following:  BP reading is greater than160 (top number) or greater than 110 (bottom number) Severe headaches not improving with Tylenol Serious difficulty catching your breath Any worsening symptoms from Zone 2   Second Trimester of Pregnancy The second trimester is from week 13 through week 28, months 4 through 6. The second trimester is often a time when you feel your best. Your body has also adjusted to being pregnant, and you begin to feel better physically. Usually, morning sickness has lessened or quit completely, you may have more energy, and you may have an increase in appetite. The second trimester is also a time when the fetus is growing rapidly. At the end of the sixth month, the fetus is about 9 inches long and weighs about 1 pounds. You will likely begin to feel the baby move (quickening) between 18 and 20 weeks of the pregnancy. BODY CHANGES Your body goes through many changes during pregnancy. The changes vary from woman to woman.  Your weight will continue to increase. You will notice your lower abdomen bulging out. You may begin to get stretch marks on your  hips, abdomen, and breasts. You may develop headaches that can be relieved by medicines approved by your health care provider. You may urinate more often because the fetus is pressing on your bladder. You may develop or continue to have heartburn as a result of your pregnancy. You may develop constipation because certain hormones are causing the muscles that push waste through your intestines to slow down. You may develop hemorrhoids or swollen, bulging veins (varicose veins). You may have back pain because of the weight gain and pregnancy hormones relaxing your joints between the bones in your pelvis and as a result of a shift in weight and the muscles that support your balance. Your breasts will continue to grow and be tender. Your gums may bleed and may be sensitive to brushing and flossing. Dark spots or blotches (chloasma, mask of pregnancy) may develop on your face. This will likely fade after the baby is born. A dark line from your belly button to the pubic area (linea nigra) may appear. This  will likely fade after the baby is born. You may have changes in your hair. These can include thickening of your hair, rapid growth, and changes in texture. Some women also have hair loss during or after pregnancy, or hair that feels dry or thin. Your hair will most likely return to normal after your baby is born. WHAT TO EXPECT AT YOUR PRENATAL VISITS During a routine prenatal visit: You will be weighed to make sure you and the fetus are growing normally. Your blood pressure will be taken. Your abdomen will be measured to track your baby's growth. The fetal heartbeat will be listened to. Any test results from the previous visit will be discussed. Your health care provider may ask you: How you are feeling. If you are feeling the baby move. If you have had any abnormal symptoms, such as leaking fluid, bleeding, severe headaches, or abdominal cramping. If you have any questions. Other tests that may  be performed during your second trimester include: Blood tests that check for: Low iron levels (anemia). Gestational diabetes (between 24 and 28 weeks). Rh antibodies. Urine tests to check for infections, diabetes, or protein in the urine. An ultrasound to confirm the proper growth and development of the baby. An amniocentesis to check for possible genetic problems. Fetal screens for spina bifida and Down syndrome. HOME CARE INSTRUCTIONS  Avoid all smoking, herbs, alcohol, and unprescribed drugs. These chemicals affect the formation and growth of the baby. Follow your health care provider's instructions regarding medicine use. There are medicines that are either safe or unsafe to take during pregnancy. Exercise only as directed by your health care provider. Experiencing uterine cramps is a good sign to stop exercising. Continue to eat regular, healthy meals. Wear a good support bra for breast tenderness. Do not use hot tubs, steam rooms, or saunas. Wear your seat belt at all times when driving. Avoid raw meat, uncooked cheese, cat litter boxes, and soil used by cats. These carry germs that can cause birth defects in the baby. Take your prenatal vitamins. Try taking a stool softener (if your health care provider approves) if you develop constipation. Eat more high-fiber foods, such as fresh vegetables or fruit and whole grains. Drink plenty of fluids to keep your urine clear or pale yellow. Take warm sitz baths to soothe any pain or discomfort caused by hemorrhoids. Use hemorrhoid cream if your health care provider approves. If you develop varicose veins, wear support hose. Elevate your feet for 15 minutes, 3-4 times a day. Limit salt in your diet. Avoid heavy lifting, wear low heel shoes, and practice good posture. Rest with your legs elevated if you have leg cramps or low back pain. Visit your dentist if you have not gone yet during your pregnancy. Use a soft toothbrush to brush your teeth  and be gentle when you floss. A sexual relationship may be continued unless your health care provider directs you otherwise. Continue to go to all your prenatal visits as directed by your health care provider. SEEK MEDICAL CARE IF:  You have dizziness. You have mild pelvic cramps, pelvic pressure, or nagging pain in the abdominal area. You have persistent nausea, vomiting, or diarrhea. You have a bad smelling vaginal discharge. You have pain with urination. SEEK IMMEDIATE MEDICAL CARE IF:  You have a fever. You are leaking fluid from your vagina. You have spotting or bleeding from your vagina. You have severe abdominal cramping or pain. You have rapid weight gain or loss. You have shortness of   breath with chest pain. You notice sudden or extreme swelling of your face, hands, ankles, feet, or legs. You have not felt your baby move in over an hour. You have severe headaches that do not go away with medicine. You have vision changes. Document Released: 03/19/2001 Document Revised: 03/30/2013 Document Reviewed: 05/26/2012 [facility] Patient Information 2015 [place], [place]. This information is not intended to replace advice given to you by your health care provider. Make sure you discuss any questions you have with your health care provider.

## 2020-11-28 NOTE — Progress Notes (Signed)
    LOW-RISK PREGNANCY VISIT Patient name: Pamela Mccarty MRN [id number]  Date of birth: 1995/05/02 Chief Complaint:   Routine Prenatal Visit  History of Present Illness:   Pamela Mccarty is a 25 y.o. G3P0020 female at [redacted]w[redacted]d with an Estimated Date of Delivery: 03/31/21 being seen today for ongoing management of a low-risk pregnancy.   Today she reports no complaints. No recent seizures. Contractions: Not present. Vag. Bleeding: None.  Movement: Present. denies leaking of fluid.  Depression screen [facility] 2/9 09/18/2020 08/29/2020  Decreased Interest 0 0  Down, Depressed, Hopeless 0 0  PHQ - 2 Score 0 0  Altered sleeping 0 1  Tired, decreased energy 0 1  Change in appetite 0 0  Feeling bad or failure about yourself  0 0  Trouble concentrating 0 0  Moving slowly or fidgety/restless 0 0  Suicidal thoughts 0 0  PHQ-9 Score 0 2     GAD 7 : Generalized Anxiety Score 09/18/2020 08/29/2020  Nervous, Anxious, on Edge 0 0  Control/stop worrying 0 0  Worry too much - different things 0 0  Trouble relaxing 1 0  Restless 0 0  Easily annoyed or irritable 2 0  Afraid - awful might happen 0 0  Total GAD 7 Score 3 0      Review of Systems:   Pertinent items are noted in HPI Denies abnormal vaginal discharge w/ itching/odor/irritation, headaches, visual changes, shortness of breath, chest pain, abdominal pain, severe nausea/vomiting, or problems with urination or bowel movements unless otherwise stated above. Pertinent History Reviewed:  Reviewed past medical,surgical, social, obstetrical and family history.  Reviewed problem list, medications and allergies. Physical Assessment:   Vitals:   11/28/20 0928  BP: 133/81  Pulse: 80  Weight: 220 lb (99.8 kg)  Body mass index is 35.51 kg/m.        Physical Examination:   General appearance: Well appearing, and in no distress  Mental status: Alert, oriented to person, place, and time  Skin: Warm & dry  Cardiovascular: Normal heart rate  noted  Respiratory: Normal respiratory effort, no distress  Abdomen: Soft, gravid, nontender  Pelvic: Cervical exam deferred         Extremities: Edema: None  Fetal Status: Fetal Heart Rate (bpm): 163 Fundal Height: 23 cm Movement: Present    Chaperone: N/A   No results found for this or any previous visit (from the past 24 hour(s)).  Assessment & Plan:  1) Low-risk pregnancy G3P0020 at [redacted]w[redacted]d with an Estimated Date of Delivery: 03/31/21   2) H/O ?seizures, none recently, not taking keppra   Meds: No orders of the defined types were placed in this encounter.  Labs/procedures today: none  Plan:  Continue routine obstetrical care  Next visit: prefers will be in person for pn2     Reviewed: Preterm labor symptoms and general obstetric precautions including but not limited to vaginal bleeding, contractions, leaking of fluid and fetal movement were reviewed in detail with the patient.  All questions were answered.  Follow-up: Return in about 4 weeks (around 12/26/2020) for LROB, PN2, CNM, in person.  No future appointments.  No orders of the defined types were placed in this encounter.  CLYDENE BURACK CNM, [facility] 11/28/2020 9:42 AM

## 2020-12-03 ENCOUNTER — Emergency Department (HOSPITAL_COMMUNITY)
Admission: EM | Admit: 2020-12-03 | Discharge: 2020-12-03 | Disposition: A | Discharge status: Home / Self Care | Payer: Medicaid Other | Attending: Physician Assistant | Admitting: Physician Assistant

## 2020-12-03 ENCOUNTER — Other Ambulatory Visit: Payer: Self-pay

## 2020-12-03 ENCOUNTER — Encounter (HOSPITAL_COMMUNITY): Payer: Self-pay | Admitting: Emergency Medicine

## 2020-12-03 DIAGNOSIS — L0231 Cutaneous abscess of buttock: Secondary | ICD-10-CM | POA: Diagnosis not present

## 2020-12-03 DIAGNOSIS — Z5321 Procedure and treatment not carried out due to patient leaving prior to being seen by health care provider: Secondary | ICD-10-CM | POA: Insufficient documentation

## 2020-12-03 MED ORDER — LIDOCAINE-EPINEPHRINE (PF) 2 %-1:200000 IJ SOLN: 20.0000 mL | Freq: Once | INTRAMUSCULAR | Status: DC

## 2020-12-03 NOTE — ED Triage Notes (Signed)
Pt c/o an abscess on her buttocks for the past 2 days. Denies drainage.

## 2020-12-03 NOTE — ED Provider Notes (Signed)
Emergency Medicine Provider Triage Evaluation Note  Pamela Mccarty , a 25 y.o. female  was evaluated in triage.  Pt complains of abscess to the pilonidal area x2 days.  Review of Systems  Positive: abscess Negative: fevers  Physical Exam  BP 118/68 (BP Location: Right Arm)   Pulse (!) 106   Temp 98.8 F (37.1 C) (Oral)   Resp 18   Ht 5\' 6"  (1.676 m)   Wt 99.8 kg   LMP 06/24/2020 (Exact Date)   SpO2 100%   BMI 35.51 kg/m  Gen:   Awake, no distress   Resp:  Normal effort  MSK:   Moves extremities without difficulty  Other:    Medical Decision Making  Medically screening exam initiated at 2:20 PM.  Appropriate orders placed.  Pamela Mccarty was informed that the remainder of the evaluation will be completed by another provider, this initial triage assessment does not replace that evaluation, and the importance of remaining in the ED until their evaluation is complete.     [name], Calil Amor S, PA-C 12/03/20 1420    12/05/20 P, DO 12/03/20 1507

## 2020-12-26 ENCOUNTER — Encounter: Payer: Self-pay | Admitting: Women's Health

## 2020-12-26 ENCOUNTER — Ambulatory Visit (INDEPENDENT_AMBULATORY_CARE_PROVIDER_SITE_OTHER): Payer: Medicaid Other | Admitting: Women's Health

## 2020-12-26 ENCOUNTER — Other Ambulatory Visit: Payer: Medicaid Other

## 2020-12-26 ENCOUNTER — Other Ambulatory Visit: Payer: Self-pay

## 2020-12-26 VITALS — BP 130/76 | HR 86 | Wt 222.0 lb

## 2020-12-26 DIAGNOSIS — Z3A26 26 weeks gestation of pregnancy: Secondary | ICD-10-CM

## 2020-12-26 DIAGNOSIS — Z3482 Encounter for supervision of other normal pregnancy, second trimester: Secondary | ICD-10-CM

## 2020-12-26 DIAGNOSIS — Z23 Encounter for immunization: Secondary | ICD-10-CM

## 2020-12-26 DIAGNOSIS — Z3480 Encounter for supervision of other normal pregnancy, unspecified trimester: Secondary | ICD-10-CM

## 2020-12-26 NOTE — Addendum Note (Signed)
Addended by: Federico Flake A on: 12/26/2020 09:06 AM   Modules accepted: Orders

## 2020-12-26 NOTE — Progress Notes (Signed)
    LOW-RISK PREGNANCY VISIT Patient name: Pamela Mccarty MRN [id number]  Date of birth: 1995-05-16 Chief Complaint:   Routine Prenatal Visit (PN2/ need refill Doxylamine)  History of Present Illness:   Pamela Mccarty is a 25 y.o. G16P0020 female at [redacted]w[redacted]d with an Estimated Date of Delivery: 03/31/21 being seen today for ongoing management of a low-risk pregnancy.   Today she reports occasional contractions. Contractions: Not present.  .  Movement: Present. denies leaking of fluid.  Depression screen [facility] 2/9 12/26/2020 09/18/2020 08/29/2020  Decreased Interest 0 0 0  Down, Depressed, Hopeless 0 0 0  PHQ - 2 Score 0 0 0  Altered sleeping 2 0 1  Tired, decreased energy 2 0 1  Change in appetite 2 0 0  Feeling bad or failure about yourself  0 0 0  Trouble concentrating 0 0 0  Moving slowly or fidgety/restless 0 0 0  Suicidal thoughts 0 0 0  PHQ-9 Score 6 0 2     GAD 7 : Generalized Anxiety Score 12/26/2020 09/18/2020 08/29/2020  Nervous, Anxious, on Edge 0 0 0  Control/stop worrying 0 0 0  Worry too much - different things 0 0 0  Trouble relaxing 0 1 0  Restless 0 0 0  Easily annoyed or irritable 0 2 0  Afraid - awful might happen 0 0 0  Total GAD 7 Score 0 3 0      Review of Systems:   Pertinent items are noted in HPI Denies abnormal vaginal discharge w/ itching/odor/irritation, headaches, visual changes, shortness of breath, chest pain, abdominal pain, severe nausea/vomiting, or problems with urination or bowel movements unless otherwise stated above. Pertinent History Reviewed:  Reviewed past medical,surgical, social, obstetrical and family history.  Reviewed problem list, medications and allergies. Physical Assessment:   Vitals:   12/26/20 0833  BP: 130/76  Pulse: 86  Weight: 222 lb (100.7 kg)  Body mass index is 35.83 kg/m.        Physical Examination:   General appearance: Well appearing, and in no distress  Mental status: Alert, oriented to person, place, and  time  Skin: Warm & dry  Cardiovascular: Normal heart rate noted  Respiratory: Normal respiratory effort, no distress  Abdomen: Soft, gravid, nontender  Pelvic: Cervical exam deferred         Extremities: Edema: Trace  Fetal Status: Fetal Heart Rate (bpm): 142 Fundal Height: 27 cm Movement: Present    Chaperone: N/A   No results found for this or any previous visit (from the past 24 hour(s)).  Assessment & Plan:  1) Low-risk pregnancy G3P0020 at [redacted]w[redacted]d with an Estimated Date of Delivery: 03/31/21    Meds: No orders of the defined types were placed in this encounter.  Labs/procedures today: flu shot, tdap, and PN2  Plan:  Continue routine obstetrical care  Next visit: prefers in person    Reviewed: Preterm labor symptoms and general obstetric precautions including but not limited to vaginal bleeding, contractions, leaking of fluid and fetal movement were reviewed in detail with the patient.  All questions were answered. Does have home bp cuff. Office bp cuff given: not applicable. Check bp weekly, let us know if consistently >140 and/or >90.  Follow-up: Return in about 4 weeks (around 01/23/2021) for LROB, CNM, in person.  No future appointments.   No orders of the defined types were placed in this encounter.  RILLIE RIFFEL CNM, [facility] 12/26/2020 8:54 AM

## 2020-12-26 NOTE — Patient Instructions (Signed)
Pamela Mccarty, thank you for choosing our office today! We appreciate the opportunity to meet your healthcare needs. You may receive a short survey by mail, e-mail, or through MyChart. If you are happy with your care we would appreciate if you could take just a few minutes to complete the survey questions. We read all of your comments and take your feedback very seriously. Thank you again for choosing our office.  [facility] [place] ([address] [place], [place] [address]) Entrance C, located off of [address] Free 24/7 valet parking   CLASSES: Go to [email or web address] to register for classes (childbirth, breastfeeding, waterbirth, infant CPR, daddy bootcamp, etc.)  Call the office ([phone]) or go to [facility] if: You begin to have strong, frequent contractions Your water breaks.  Sometimes it is a big gush of fluid, sometimes it is just a trickle that keeps getting your panties wet or running down your legs You have vaginal bleeding.  It is normal to have a small amount of spotting if your cervix was checked.  You don't feel your baby moving like normal.  If you don't, get you something to eat and drink and lay down and focus on feeling your baby move.   If your baby is still not moving like normal, you should call the office or go to [facility].  Call the office ([phone]) or go to [facility] for these signs of pre-eclampsia: Severe headache that does not go away with Tylenol Visual changes- seeing spots, double, blurred vision Pain under your right breast or upper abdomen that does not go away with Tums or heartburn medicine Nausea and/or vomiting Severe swelling in your hands, feet, and face   Tdap Vaccine It is recommended that you get the Tdap vaccine during the third trimester of EACH pregnancy to help protect your baby from getting pertussis (whooping cough) 27-36 weeks is the BEST time to do  this so that you can pass the protection on to your baby. During pregnancy is better than after pregnancy, but if you are unable to get it during pregnancy it will be offered at the hospital.  You can get this vaccine with us, at the health department, your family doctor, or some local pharmacies Everyone who will be around your baby should also be up-to-date on their vaccines before the baby comes. Adults (who are not pregnant) only need 1 dose of Tdap during adulthood.   [place] Pediatricians/Family Doctors [facility] ([facility]): [address], [phone]           [facility]: [address], [phone]                 [facility] ([facility]): [address], [phone] (call to ask if accepting patients) [facility]: [address], [place], [phone]    [place] Pediatricians/Family Doctors [facility] ([facility]): [address], [phone] [facility]: [address], [phone] [facility] [place]: [address], [phone]  [place] [facility]  [facility] ([facility]): [phone] [facility]: [address], [phone]   [place] Family Doctors [facility]: [address], [phone]  [place] [facility]  [facility]: [address], [phone]  Home Blood Pressure Monitoring for Patients   Your provider has recommended that you check your   blood pressure (BP) at least once a week at home. If you do not have a blood pressure cuff at home, one will be provided for you. Contact your provider if you have not received your monitor within 1 week.   Helpful Tips for Accurate Home Blood Pressure Checks  Don't smoke, exercise, or drink caffeine 30 minutes before checking your BP Use the restroom before checking your BP (a full bladder can raise your  pressure) Relax in a comfortable upright chair Feet on the ground Left arm resting comfortably on a flat surface at the level of your heart Legs uncrossed Back supported Sit quietly and don't talk Place the cuff on your bare arm Adjust snuggly, so that only two fingertips can fit between your skin and the top of the cuff Check 2 readings separated by at least one minute Keep a log of your BP readings For a visual, please reference this diagram: http://ccnc.care/bpdiagram  Provider Name: [facility]     Phone: [phone]  Zone 1: ALL CLEAR  Continue to monitor your symptoms:  BP reading is less than 140 (top number) or less than 90 (bottom number)  No right upper stomach pain No headaches or seeing spots No feeling nauseated or throwing up No swelling in face and hands  Zone 2: CAUTION Call your doctor's office for any of the following:  BP reading is greater than 140 (top number) or greater than 90 (bottom number)  Stomach pain under your ribs in the middle or right side Headaches or seeing spots Feeling nauseated or throwing up Swelling in face and hands  Zone 3: EMERGENCY  Seek immediate medical care if you have any of the following:  BP reading is greater than160 (top number) or greater than 110 (bottom number) Severe headaches not improving with Tylenol Serious difficulty catching your breath Any worsening symptoms from Zone 2   Third Trimester of Pregnancy The third trimester is from week 29 through week 42, months 7 through 9. The third trimester is a time when the fetus is growing rapidly. At the end of the ninth month, the fetus is about 20 inches in length and weighs 6-10 pounds.  BODY CHANGES Your body goes through many changes during pregnancy. The changes vary from woman to woman.  Your weight will continue to increase. You can expect to gain 25-35 pounds (11-16 kg) by the end of the pregnancy. You may begin to get stretch marks on your hips, abdomen,  and breasts. You may urinate more often because the fetus is moving lower into your pelvis and pressing on your bladder. You may develop or continue to have heartburn as a result of your pregnancy. You may develop constipation because certain hormones are causing the muscles that push waste through your intestines to slow down. You may develop hemorrhoids or swollen, bulging veins (varicose veins). You may have pelvic pain because of the weight gain and pregnancy hormones relaxing your joints between the bones in your pelvis. Backaches may result from overexertion of the muscles supporting your posture. You may have changes in your hair. These can include thickening of your hair, rapid growth, and changes in texture. Some women also have hair loss during or after pregnancy, or hair that feels dry or thin. Your hair will most likely return to normal after your baby is born. Your breasts will continue to grow and be tender. A yellow discharge may leak from your breasts called colostrum. Your belly button may stick out. You may   feel short of breath because of your expanding uterus. You may notice the fetus "dropping," or moving lower in your abdomen. You may have a bloody mucus discharge. This usually occurs a few days to a week before labor begins. Your cervix becomes thin and soft (effaced) near your due date. WHAT TO EXPECT AT YOUR PRENATAL EXAMS  You will have prenatal exams every 2 weeks until week 36. Then, you will have weekly prenatal exams. During a routine prenatal visit: You will be weighed to make sure you and the fetus are growing normally. Your blood pressure is taken. Your abdomen will be measured to track your baby's growth. The fetal heartbeat will be listened to. Any test results from the previous visit will be discussed. You may have a cervical check near your due date to see if you have effaced. At around 36 weeks, your caregiver will check your cervix. At the same time, your  caregiver will also perform a test on the secretions of the vaginal tissue. This test is to determine if a type of bacteria, Group B streptococcus, is present. Your caregiver will explain this further. Your caregiver may ask you: What your birth plan is. How you are feeling. If you are feeling the baby move. If you have had any abnormal symptoms, such as leaking fluid, bleeding, severe headaches, or abdominal cramping. If you have any questions. Other tests or screenings that may be performed during your third trimester include: Blood tests that check for low iron levels (anemia). Fetal testing to check the health, activity level, and growth of the fetus. Testing is done if you have certain medical conditions or if there are problems during the pregnancy. FALSE LABOR You may feel small, irregular contractions that eventually go away. These are called Braxton Hicks contractions, or false labor. Contractions may last for hours, days, or even weeks before true labor sets in. If contractions come at regular intervals, intensify, or become painful, it is best to be seen by your caregiver.  SIGNS OF LABOR  Menstrual-like cramps. Contractions that are 5 minutes apart or less. Contractions that start on the top of the uterus and spread down to the lower abdomen and back. A sense of increased pelvic pressure or back pain. A watery or bloody mucus discharge that comes from the vagina. If you have any of these signs before the 37th week of pregnancy, call your caregiver right away. You need to go to the hospital to get checked immediately. HOME CARE INSTRUCTIONS  Avoid all smoking, herbs, alcohol, and unprescribed drugs. These chemicals affect the formation and growth of the baby. Follow your caregiver's instructions regarding medicine use. There are medicines that are either safe or unsafe to take during pregnancy. Exercise only as directed by your caregiver. Experiencing uterine cramps is a good sign to  stop exercising. Continue to eat regular, healthy meals. Wear a good support bra for breast tenderness. Do not use hot tubs, steam rooms, or saunas. Wear your seat belt at all times when driving. Avoid raw meat, uncooked cheese, cat litter boxes, and soil used by cats. These carry germs that can cause birth defects in the baby. Take your prenatal vitamins. Try taking a stool softener (if your caregiver approves) if you develop constipation. Eat more high-fiber foods, such as fresh vegetables or fruit and whole grains. Drink plenty of fluids to keep your urine clear or pale yellow. Take warm sitz baths to soothe any pain or discomfort caused by hemorrhoids. Use hemorrhoid cream if   your caregiver approves. If you develop varicose veins, wear support hose. Elevate your feet for 15 minutes, 3-4 times a day. Limit salt in your diet. Avoid heavy lifting, wear low heal shoes, and practice good posture. Rest a lot with your legs elevated if you have leg cramps or low back pain. Visit your dentist if you have not gone during your pregnancy. Use a soft toothbrush to brush your teeth and be gentle when you floss. A sexual relationship may be continued unless your caregiver directs you otherwise. Do not travel far distances unless it is absolutely necessary and only with the approval of your caregiver. Take prenatal classes to understand, practice, and ask questions about the labor and delivery. Make a trial run to the hospital. Pack your hospital bag. Prepare the baby's nursery. Continue to go to all your prenatal visits as directed by your caregiver. SEEK MEDICAL CARE IF: You are unsure if you are in labor or if your water has broken. You have dizziness. You have mild pelvic cramps, pelvic pressure, or nagging pain in your abdominal area. You have persistent nausea, vomiting, or diarrhea. You have a bad smelling vaginal discharge. You have pain with urination. SEEK IMMEDIATE MEDICAL CARE IF:  You  have a fever. You are leaking fluid from your vagina. You have spotting or bleeding from your vagina. You have severe abdominal cramping or pain. You have rapid weight loss or gain. You have shortness of breath with chest pain. You notice sudden or extreme swelling of your face, hands, ankles, feet, or legs. You have not felt your baby move in over an hour. You have severe headaches that do not go away with medicine. You have vision changes. Document Released: 03/19/2001 Document Revised: 03/30/2013 Document Reviewed: 05/26/2012 ExitCare Patient Information 2015 [employer]. This information is not intended to replace advice given to you by your health care provider. Make sure you discuss any questions you have with your health care provider.       

## 2020-12-27 LAB — GLUCOSE TOLERANCE, 2 HOURS W/ 1HR
Glucose, 1 hour: 120 mg/dL (ref 65–179)
Glucose, 2 hour: 91 mg/dL (ref 65–152)
Glucose, Fasting: 84 mg/dL (ref 65–91)

## 2020-12-27 LAB — RPR: RPR Ser Ql: NONREACTIVE

## 2020-12-27 LAB — CBC
Hematocrit: 38.8 % (ref 34.0–46.6)
Hemoglobin: 13.3 g/dL (ref 11.1–15.9)
MCH: 30.2 pg (ref 26.6–33.0)
MCHC: 34.3 g/dL (ref 31.5–35.7)
MCV: 88 fL (ref 79–97)
Platelets: 210 10*3/uL (ref 150–450)
RBC: 4.4 x10E6/uL (ref 3.77–5.28)
RDW: 11.8 % (ref 11.7–15.4)
WBC: 9.4 10*3/uL (ref 3.4–10.8)

## 2020-12-27 LAB — ANTIBODY SCREEN: Antibody Screen: NEGATIVE

## 2020-12-27 LAB — HIV ANTIBODY (ROUTINE TESTING W REFLEX): HIV Screen 4th Generation wRfx: NONREACTIVE

## 2021-01-19 ENCOUNTER — Other Ambulatory Visit: Payer: Self-pay

## 2021-01-19 ENCOUNTER — Encounter: Payer: Self-pay | Admitting: *Deleted

## 2021-01-19 ENCOUNTER — Other Ambulatory Visit (INDEPENDENT_AMBULATORY_CARE_PROVIDER_SITE_OTHER): Payer: Medicaid Other | Admitting: *Deleted

## 2021-01-19 VITALS — BP 120/79 | HR 93 | Wt 222.0 lb

## 2021-01-19 DIAGNOSIS — R300 Dysuria: Secondary | ICD-10-CM | POA: Diagnosis not present

## 2021-01-19 DIAGNOSIS — Z3480 Encounter for supervision of other normal pregnancy, unspecified trimester: Secondary | ICD-10-CM

## 2021-01-19 LAB — POCT URINALYSIS DIPSTICK OB
Blood, UA: NEGATIVE
Glucose, UA: NEGATIVE
Nitrite, UA: NEGATIVE
POC,PROTEIN,UA: NEGATIVE

## 2021-01-19 MED ORDER — NITROFURANTOIN MONOHYD MACRO 100 MG PO CAPS: 100.0000 mg | ORAL_CAPSULE | Freq: Two times a day (BID) | ORAL | 0 refills | Status: DC

## 2021-01-19 NOTE — Progress Notes (Addendum)
   NURSE VISIT- UTI SYMPTOMS   SUBJECTIVE:  Pamela Mccarty is a 25 y.o. G18P0020 female here for UTI symptoms. She is [redacted]w[redacted]d pregnant. She reports  burning with urination and cramping X 3 days . No appetite and + nausea. Pt advised to drink plenty of water and have [facility] fill prescription for nausea.   OBJECTIVE:  BP 120/79   Pulse 93   Wt 222 lb (100.7 kg)   LMP 06/24/2020 (Exact Date)   BMI 35.83 kg/m   Appears well, in no apparent distress  Results for orders placed or performed in visit on 01/19/21 (from the past 24 hour(s))  POC Urinalysis Dipstick OB   Collection Time: 01/19/21 11:20 AM  Result Value Ref Range   Color, UA     Clarity, UA     Glucose, UA Negative Negative   Bilirubin, UA     Ketones, UA trace    Spec Grav, UA     Blood, UA neg    pH, UA     POC,PROTEIN,UA Negative Negative, Trace, Small (1+), Moderate (2+), Large (3+), 4+   Urobilinogen, UA     Nitrite, UA neg    Leukocytes, UA Trace (A) Negative   Appearance     Odor      ASSESSMENT: Pregnancy [redacted]w[redacted]d with UTI symptoms and negative nitrites  PLAN: Discussed with Joellyn Haff, CNM, [facility]   Rx sent by provider today: Yes Urine culture sent Call or return to clinic prn if these symptoms worsen or fail to improve as anticipated. Follow-up: as scheduled   Malachy Mood  01/19/2021 11:31 AM  Chart reviewed for nurse visit. Agree with plan of care. Rx macrobid, send cx.  Nitzia, [place], [place] 01/19/2021 11:45 AM

## 2021-01-19 NOTE — Addendum Note (Signed)
Addended by: Shawna Clamp R on: 01/19/2021 11:45 AM   Modules accepted: Orders

## 2021-01-22 LAB — URINE CULTURE

## 2021-01-23 ENCOUNTER — Ambulatory Visit (INDEPENDENT_AMBULATORY_CARE_PROVIDER_SITE_OTHER): Payer: Medicaid Other | Admitting: Women's Health

## 2021-01-23 ENCOUNTER — Encounter: Payer: Self-pay | Admitting: Women's Health

## 2021-01-23 ENCOUNTER — Other Ambulatory Visit: Payer: Self-pay

## 2021-01-23 VITALS — BP 123/69 | HR 87 | Wt 225.0 lb

## 2021-01-23 DIAGNOSIS — Z3483 Encounter for supervision of other normal pregnancy, third trimester: Secondary | ICD-10-CM

## 2021-01-23 NOTE — Patient Instructions (Signed)
Pamela Mccarty, thank you for choosing our office today! We appreciate the opportunity to meet your healthcare needs. You may receive a short survey by mail, e-mail, or through [employer]. If you are happy with your care we would appreciate if you could take just a few minutes to complete the survey questions. We read all of your comments and take your feedback very seriously. Thank you again for choosing our office.  [facility] [employer] Team at [facility]  [facility] ([address] [place], [place] [address]) Entrance C, located off of [address] Free 24/7 valet parking   CLASSES: Go to [email or web address] to register for classes (childbirth, breastfeeding, waterbirth, infant CPR, daddy bootcamp, etc.)  Call the office [phone]) or go to [facility] if: You begin to have strong, frequent contractions Your water breaks.  Sometimes it is a big gush of fluid, sometimes it is just a trickle that keeps getting your panties wet or running down your legs You have vaginal bleeding.  It is normal to have a small amount of spotting if your cervix was checked.  You don't feel your baby moving like normal.  If you don't, get you something to eat and drink and lay down and focus on feeling your baby move.   If your baby is still not moving like normal, you should call the office or go to [facility].  Call the office [phone]) or go to [facility] hospital for these signs of pre-eclampsia: Severe headache that does not go away with Tylenol Visual changes- seeing spots, double, blurred vision Pain under your right breast or upper abdomen that does not go away with Tums or heartburn medicine Nausea and/or vomiting Severe swelling in your hands, feet, and face   Tdap Vaccine It is recommended that you get the Tdap vaccine during the third trimester of EACH pregnancy to help protect your baby from getting pertussis (whooping cough) 27-36 weeks is the BEST time to do  this so that you can pass the protection on to your baby. During pregnancy is better than after pregnancy, but if you are unable to get it during pregnancy it will be offered at the hospital.  You can get this vaccine with [place], at the health department, your family doctor, or some local pharmacies Everyone who will be around your baby should also be up-to-date on their vaccines before the baby comes. Adults (who are not pregnant) only need 1 dose of Tdap during adulthood.   [facility] Pediatricians/Family Doctors [facility]): [address] Colette Ribas, [phone]           [facility]: [address], [phone]                [facility]): [address], [phone] (call to ask if accepting patients) [facility] Department: [address], [place], [phone]    [facility] Pediatricians/Family Doctors [facility]): [phone], [phone] [facility]: [address] [place], [phone] [facility] [place]: [address], [phone]  [facility]  [facility]): [phone] [facility]: [address] [phone]   [facility]: [address], [phone]  [facility] Family Doctors  [facility]: [phone], [phone]  Home Blood Pressure Monitoring for Patients   Your provider has recommended that you check your  blood pressure (BP) at least once a week at home. If you do not have a blood pressure cuff at home, one will be provided for you. Contact your provider if you have not received your monitor within 1 week.   Helpful Tips for Accurate Home Blood Pressure Checks  Don't smoke, exercise, or drink caffeine 30 minutes before checking your BP Use the restroom before checking your BP (a full bladder can raise your  pressure) Relax in a comfortable upright chair Feet on the ground Left arm resting comfortably on a flat surface at the level of your heart Legs uncrossed Back supported Sit quietly and don't talk Place the cuff on your bare arm Adjust snuggly, so that only two fingertips can fit between your skin and the top of the cuff Check 2 readings separated by at least one minute Keep a log of your BP readings For a visual, please reference this diagram: http://ccnc.care/bpdiagram  Provider Name: [facility]     Phone: [phone]  Zone 1: ALL CLEAR  Continue to monitor your symptoms:  BP reading is less than 140 (top number) or less than 90 (bottom number)  No right upper stomach pain No headaches or seeing spots No feeling nauseated or throwing up No swelling in face and hands  Zone 2: CAUTION Call your doctor's office for any of the following:  BP reading is greater than 140 (top number) or greater than 90 (bottom number)  Stomach pain under your ribs in the middle or right side Headaches or seeing spots Feeling nauseated or throwing up Swelling in face and hands  Zone 3: EMERGENCY  Seek immediate medical care if you have any of the following:  BP reading is greater than160 (top number) or greater than 110 (bottom number) Severe headaches not improving with Tylenol Serious difficulty catching your breath Any worsening symptoms from Zone 2  Preterm Labor and Birth Information  The normal length of a pregnancy is 39-41 weeks. Preterm labor is when labor starts before 37 completed weeks of pregnancy. What are the risk factors for preterm labor? Preterm labor is more likely to occur in women who: Have certain infections during pregnancy such as a bladder infection, sexually transmitted infection, or infection inside the uterus (chorioamnionitis). Have a shorter-than-normal cervix. Have gone into preterm labor before. Have had surgery on their cervix. Are younger than age 17  or older than age 35. Are African American. Are pregnant with twins or multiple babies (multiple gestation). Take street drugs or smoke while pregnant. Do not gain enough weight while pregnant. Became pregnant shortly after having been pregnant. What are the symptoms of preterm labor? Symptoms of preterm labor include: Cramps similar to those that can happen during a menstrual period. The cramps may happen with diarrhea. Pain in the abdomen or lower back. Regular uterine contractions that may feel like tightening of the abdomen. A feeling of increased pressure in the pelvis. Increased watery or bloody mucus discharge from the vagina. Water breaking (ruptured amniotic sac). Why is it important to recognize signs of preterm labor? It is important to recognize signs of preterm labor because babies who are born prematurely may not be fully developed. This can put them at an increased risk for: Long-term (chronic) heart and lung problems. Difficulty immediately after birth with regulating body systems, including blood sugar, body temperature, heart rate, and breathing rate. Bleeding in the brain. Cerebral palsy. Learning difficulties. Death. These risks are highest for babies who are born before 34 weeks   of pregnancy. How is preterm labor treated? Treatment depends on the length of your pregnancy, your condition, and the health of your baby. It may involve: Having a stitch (suture) placed in your cervix to prevent your cervix from opening too early (cerclage). Taking or being given medicines, such as: Hormone medicines. These may be given early in pregnancy to help support the pregnancy. Medicine to stop contractions. Medicines to help mature the baby's lungs. These may be prescribed if the risk of delivery is high. Medicines to prevent your baby from developing cerebral palsy. If the labor happens before 34 weeks of pregnancy, you may need to stay in the hospital. What should I do if I  think I am in preterm labor? If you think that you are going into preterm labor, call your health care provider right away. How can I prevent preterm labor in future pregnancies? To increase your chance of having a full-term pregnancy: Do not use any tobacco products, such as cigarettes, chewing tobacco, and e-cigarettes. If you need help quitting, ask your health care provider. Do not use street drugs or medicines that have not been prescribed to you during your pregnancy. Talk with your health care provider before taking any herbal supplements, even if you have been taking them regularly. Make sure you gain a healthy amount of weight during your pregnancy. Watch for infection. If you think that you might have an infection, get it checked right away. Make sure to tell your health care provider if you have gone into preterm labor before. This information is not intended to replace advice given to you by your health care provider. Make sure you discuss any questions you have with your health care provider. Document Revised: 07/17/2018 Document Reviewed: 08/16/2015 Elsevier Patient Education  2020 Elsevier Inc.   

## 2021-01-23 NOTE — Progress Notes (Signed)
LOW-RISK PREGNANCY VISIT Patient name: Pamela Mccarty MRN [id number]  Date of birth: 1996-03-18 Chief Complaint:   Routine Prenatal Visit  History of Present Illness:   Pamela Mccarty is a 25 y.o. G14P0020 female at [redacted]w[redacted]d with an Estimated Date of Delivery: 03/31/21 being seen today for ongoing management of a low-risk pregnancy.   Today she reports  was taking macrobid for presumed uti, cx neg. States has only taken 2 pills, threw them up. No current sx. . Contractions: Irritability. Vag. Bleeding: None.  Movement: Present. denies leaking of fluid.  Depression screen [facility] 2/9 12/26/2020 09/18/2020 08/29/2020  Decreased Interest 0 0 0  Down, Depressed, Hopeless 0 0 0  PHQ - 2 Score 0 0 0  Altered sleeping 2 0 1  Tired, decreased energy 2 0 1  Change in appetite 2 0 0  Feeling bad or failure about yourself  0 0 0  Trouble concentrating 0 0 0  Moving slowly or fidgety/restless 0 0 0  Suicidal thoughts 0 0 0  PHQ-9 Score 6 0 2     GAD 7 : Generalized Anxiety Score 12/26/2020 09/18/2020 08/29/2020  Nervous, Anxious, on Edge 0 0 0  Control/stop worrying 0 0 0  Worry too much - different things 0 0 0  Trouble relaxing 0 1 0  Restless 0 0 0  Easily annoyed or irritable 0 2 0  Afraid - awful might happen 0 0 0  Total GAD 7 Score 0 3 0      Review of Systems:   Pertinent items are noted in HPI Denies abnormal vaginal discharge w/ itching/odor/irritation, headaches, visual changes, shortness of breath, chest pain, abdominal pain, severe nausea/vomiting, or problems with urination or bowel movements unless otherwise stated above. Pertinent History Reviewed:  Reviewed past medical,surgical, social, obstetrical and family history.  Reviewed problem list, medications and allergies. Physical Assessment:   Vitals:   01/23/21 1522  BP: 123/69  Pulse: 87  Weight: 225 lb (102.1 kg)  Body mass index is 36.32 kg/m.        Physical Examination:   General appearance: Well appearing, and in  no distress  Mental status: Alert, oriented to person, place, and time  Skin: Warm & dry  Cardiovascular: Normal heart rate noted  Respiratory: Normal respiratory effort, no distress  Abdomen: Soft, gravid, nontender  Pelvic: Cervical exam deferred         Extremities: Edema: Trace  Fetal Status: Fetal Heart Rate (bpm): 152 Fundal Height: 31 cm Movement: Present    Chaperone: N/A   No results found for this or any previous visit (from the past 24 hour(s)).  Assessment & Plan:  1) Low-risk pregnancy G3P0020 at [redacted]w[redacted]d with an Estimated Date of Delivery: 03/31/21   2) UTI sx 10/14> cx neg, threw up 2 macrobid, currently asymptomatic, ok not to take anymore   Meds: No orders of the defined types were placed in this encounter.  Labs/procedures today: none  Plan:  Continue routine obstetrical care  Next visit: prefers in person    Reviewed: Preterm labor symptoms and general obstetric precautions including but not limited to vaginal bleeding, contractions, leaking of fluid and fetal movement were reviewed in detail with the patient.  All questions were answered. Does have home bp cuff. Office bp cuff given: not applicable. Check bp weekly, let us know if consistently >140 and/or >90.  Follow-up: Return in about 2 weeks (around 02/06/2021) for LROB, CNM, in person.  Future Appointments  Date Time Provider Department  Center  02/06/2021 10:10 AM Cheral Marker, CNM [facility]    No orders of the defined types were placed in this encounter.  HERMELA HARDT CNM, [facility] 01/23/2021 4:03 PM

## 2021-01-29 ENCOUNTER — Inpatient Hospital Stay (HOSPITAL_COMMUNITY)
Admission: AD | Admit: 2021-01-29 | Discharge: 2021-02-01 | DRG: 805 | Disposition: A | Discharge status: Home / Self Care | Payer: Medicaid Other | Attending: Obstetrics and Gynecology | Admitting: Obstetrics and Gynecology

## 2021-01-29 ENCOUNTER — Encounter (HOSPITAL_COMMUNITY): Payer: Self-pay | Admitting: Anesthesiology

## 2021-01-29 ENCOUNTER — Encounter (HOSPITAL_COMMUNITY): Payer: Self-pay

## 2021-01-29 ENCOUNTER — Other Ambulatory Visit: Payer: Self-pay

## 2021-01-29 DIAGNOSIS — O99324 Drug use complicating childbirth: Secondary | ICD-10-CM | POA: Diagnosis present

## 2021-01-29 DIAGNOSIS — Z87891 Personal history of nicotine dependence: Secondary | ICD-10-CM

## 2021-01-29 DIAGNOSIS — O42919 Preterm premature rupture of membranes, unspecified as to length of time between rupture and onset of labor, unspecified trimester: Secondary | ICD-10-CM | POA: Diagnosis present

## 2021-01-29 DIAGNOSIS — O42913 Preterm premature rupture of membranes, unspecified as to length of time between rupture and onset of labor, third trimester: Secondary | ICD-10-CM | POA: Diagnosis present

## 2021-01-29 DIAGNOSIS — Z3480 Encounter for supervision of other normal pregnancy, unspecified trimester: Secondary | ICD-10-CM

## 2021-01-29 DIAGNOSIS — O4593 Premature separation of placenta, unspecified, third trimester: Secondary | ICD-10-CM | POA: Diagnosis present

## 2021-01-29 DIAGNOSIS — Z20822 Contact with and (suspected) exposure to covid-19: Secondary | ICD-10-CM | POA: Diagnosis present

## 2021-01-29 DIAGNOSIS — Z3A31 31 weeks gestation of pregnancy: Secondary | ICD-10-CM | POA: Diagnosis not present

## 2021-01-29 DIAGNOSIS — O42013 Preterm premature rupture of membranes, onset of labor within 24 hours of rupture, third trimester: Secondary | ICD-10-CM

## 2021-01-29 DIAGNOSIS — F1290 Cannabis use, unspecified, uncomplicated: Secondary | ICD-10-CM | POA: Diagnosis present

## 2021-01-29 DIAGNOSIS — F418 Other specified anxiety disorders: Secondary | ICD-10-CM | POA: Diagnosis present

## 2021-01-29 LAB — RAPID URINE DRUG SCREEN, HOSP PERFORMED
Amphetamines: NOT DETECTED
Barbiturates: NOT DETECTED
Benzodiazepines: NOT DETECTED
Cocaine: NOT DETECTED
Opiates: NOT DETECTED
Tetrahydrocannabinol: POSITIVE — AB

## 2021-01-29 LAB — RESP PANEL BY RT-PCR (FLU A&B, COVID) ARPGX2
Influenza A by PCR: NEGATIVE
Influenza B by PCR: NEGATIVE
SARS Coronavirus 2 by RT PCR: NEGATIVE

## 2021-01-29 LAB — BASIC METABOLIC PANEL
Anion gap: 5 (ref 5–15)
BUN: 7 mg/dL (ref 6–20)
CO2: 22 mmol/L (ref 22–32)
Calcium: 8.6 mg/dL — ABNORMAL LOW (ref 8.9–10.3)
Chloride: 108 mmol/L (ref 98–111)
Creatinine, Ser: 0.55 mg/dL (ref 0.44–1.00)
GFR, Estimated: >60 mL/min (ref >60)
Glucose, Bld: 117 mg/dL — ABNORMAL HIGH (ref 70–99)
Potassium: 3.3 mmol/L — ABNORMAL LOW (ref 3.5–5.1)
Sodium: 135 mmol/L (ref 135–145)

## 2021-01-29 LAB — CBC WITH DIFFERENTIAL/PLATELET
Abs Immature Granulocytes: 0.07 10*3/uL (ref 0.00–0.07)
Basophils Absolute: 0 10*3/uL (ref 0.0–0.1)
Basophils Relative: 0 %
Eosinophils Absolute: 0.1 10*3/uL (ref 0.0–0.5)
Eosinophils Relative: 1 %
HCT: 38.7 % (ref 36.0–46.0)
Hemoglobin: 13.4 g/dL (ref 12.0–15.0)
Immature Granulocytes: 1 %
Lymphocytes Relative: 12 %
Lymphs Abs: 1.7 10*3/uL (ref 0.7–4.0)
MCH: 31 pg (ref 26.0–34.0)
MCHC: 34.6 g/dL (ref 30.0–36.0)
MCV: 89.6 fL (ref 80.0–100.0)
Monocytes Absolute: 1.1 10*3/uL — ABNORMAL HIGH (ref 0.1–1.0)
Monocytes Relative: 7 %
Neutro Abs: 11.3 10*3/uL — ABNORMAL HIGH (ref 1.7–7.7)
Neutrophils Relative %: 79 %
Platelets: 238 10*3/uL (ref 150–400)
RBC: 4.32 MIL/uL (ref 3.87–5.11)
RDW: 12.9 % (ref 11.5–15.5)
WBC: 14.3 10*3/uL — ABNORMAL HIGH (ref 4.0–10.5)
nRBC: 0 % (ref 0.0–0.2)

## 2021-01-29 LAB — TYPE AND SCREEN
ABO/RH(D): B POS
Antibody Screen: NEGATIVE

## 2021-01-29 LAB — URINALYSIS, ROUTINE W REFLEX MICROSCOPIC
Bilirubin Urine: NEGATIVE
Glucose, UA: NEGATIVE mg/dL
Ketones, ur: NEGATIVE mg/dL
Nitrite: NEGATIVE
Protein, ur: NEGATIVE mg/dL
Specific Gravity, Urine: 1.01 (ref 1.005–1.030)
WBC, UA: >50 WBC/hpf — ABNORMAL HIGH (ref 0–5)
pH: 6 (ref 5.0–8.0)

## 2021-01-29 LAB — GROUP B STREP BY PCR: Group B strep by PCR: NEGATIVE

## 2021-01-29 MED ORDER — LIDOCAINE HCL (PF) 1 % IJ SOLN: 30.0000 mL | INTRAMUSCULAR | Status: DC | PRN

## 2021-01-29 MED ORDER — MAGNESIUM SULFATE 40 GM/1000ML IV SOLN
2.0000 g/h | INTRAVENOUS | Status: AC
  Administered 2021-01-30 (×2): 2 g/h via INTRAVENOUS
  Filled 2021-01-29: qty 1000

## 2021-01-29 MED ORDER — LACTATED RINGERS IV SOLN: 500.0000 mL | INTRAVENOUS | Status: DC | PRN

## 2021-01-29 MED ORDER — OXYCODONE-ACETAMINOPHEN 5-325 MG PO TABS: 1.0000 | ORAL_TABLET | ORAL | Status: DC | PRN

## 2021-01-29 MED ORDER — BETAMETHASONE SOD PHOS & ACET 6 (3-3) MG/ML IJ SUSP
12.0000 mg | INTRAMUSCULAR | Status: AC
  Administered 2021-01-29 – 2021-01-30 (×2): 12 mg via INTRAMUSCULAR
  Filled 2021-01-29: qty 5

## 2021-01-29 MED ORDER — SODIUM CHLORIDE 0.9 % IV BOLUS
1000.0000 mL | Freq: Once | INTRAVENOUS | Status: AC
  Administered 2021-01-29: 1000 mL via INTRAVENOUS

## 2021-01-29 MED ORDER — ACETAMINOPHEN 325 MG PO TABS: 650.0000 mg | ORAL_TABLET | ORAL | Status: DC | PRN

## 2021-01-29 MED ORDER — SODIUM CHLORIDE 0.9 % IV SOLN
25.0000 mg | Freq: Once | INTRAVENOUS | Status: AC
  Administered 2021-01-30: 25 mg via INTRAVENOUS
  Filled 2021-01-29 (×2): qty 1

## 2021-01-29 MED ORDER — LACTATED RINGERS IV SOLN: INTRAVENOUS | Status: DC

## 2021-01-29 MED ORDER — AZITHROMYCIN 500 MG PO TABS
1000.0000 mg | ORAL_TABLET | Freq: Once | ORAL | Status: AC
  Administered 2021-01-29: 1000 mg via ORAL
  Filled 2021-01-29: qty 2

## 2021-01-29 MED ORDER — AMOXICILLIN 500 MG PO CAPS: 500.0000 mg | ORAL_CAPSULE | Freq: Three times a day (TID) | ORAL | Status: DC

## 2021-01-29 MED ORDER — ONDANSETRON HCL 4 MG/2ML IJ SOLN
4.0000 mg | Freq: Four times a day (QID) | INTRAMUSCULAR | Status: DC | PRN
  Administered 2021-01-29: 4 mg via INTRAVENOUS
  Filled 2021-01-29: qty 2

## 2021-01-29 MED ORDER — FENTANYL CITRATE (PF) 100 MCG/2ML IJ SOLN
50.0000 ug | INTRAMUSCULAR | Status: DC | PRN
  Administered 2021-01-29: 50 ug via INTRAVENOUS
  Filled 2021-01-29: qty 2

## 2021-01-29 MED ORDER — OXYTOCIN BOLUS FROM INFUSION
333.0000 mL | Freq: Once | INTRAVENOUS | Status: AC
  Administered 2021-01-30: 333 mL via INTRAVENOUS

## 2021-01-29 MED ORDER — SODIUM CHLORIDE 0.9 % IV SOLN
2.0000 g | Freq: Four times a day (QID) | INTRAVENOUS | Status: DC
  Administered 2021-01-29 – 2021-01-30 (×4): 2 g via INTRAVENOUS
  Filled 2021-01-29 (×4): qty 2000

## 2021-01-29 MED ORDER — MAGNESIUM SULFATE BOLUS VIA INFUSION
4.0000 g | Freq: Once | INTRAVENOUS | Status: AC
  Administered 2021-01-29: 4 g via INTRAVENOUS
  Filled 2021-01-29: qty 1000

## 2021-01-29 MED ORDER — FENTANYL CITRATE (PF) 100 MCG/2ML IJ SOLN
50.0000 ug | INTRAMUSCULAR | Status: DC | PRN
  Administered 2021-01-29: 100 ug via INTRAVENOUS
  Administered 2021-01-30: 50 ug via INTRAVENOUS
  Administered 2021-01-30 (×4): 100 ug via INTRAVENOUS
  Filled 2021-01-29 (×6): qty 2

## 2021-01-29 MED ORDER — SODIUM CHLORIDE 0.9 % IV SOLN: 25.0000 mg | Freq: Once | INTRAVENOUS | Status: DC

## 2021-01-29 MED ORDER — OXYTOCIN-SODIUM CHLORIDE 30-0.9 UT/500ML-% IV SOLN
2.5000 [IU]/h | INTRAVENOUS | Status: DC
  Administered 2021-01-30: 2.5 [IU]/h via INTRAVENOUS
  Filled 2021-01-29: qty 500

## 2021-01-29 MED ORDER — OXYCODONE-ACETAMINOPHEN 5-325 MG PO TABS: 2.0000 | ORAL_TABLET | ORAL | Status: DC | PRN

## 2021-01-29 MED ORDER — SOD CITRATE-CITRIC ACID 500-334 MG/5ML PO SOLN: 30.0000 mL | ORAL | Status: DC | PRN

## 2021-01-29 NOTE — ED Notes (Signed)
Pt hooked up to baby monitor,cardiac monitor.

## 2021-01-29 NOTE — Progress Notes (Signed)
Received call from [facility] RN. Pt is [redacted] weeks pregnant presenting with c/o rupture of membranes at 1615 today. Pt says that the fluid is clear. Denies vaginal bleeding. Says she gets her care at [facility]. 20- Spoke with Dr. Vergie Living. Says pt is to be transferred to here to MAU for further assessment.1722- MAU staff notified that pt will be transferred to them. 29- [facility] RN notifed that Dr. Vergie Living wants the pt transferred here to MAU. She says [facility] is on the way to pick up the pt.

## 2021-01-29 NOTE — Progress Notes (Signed)
Labor Progress Note Pamela Mccarty is a 25 y.o. G3P0020 at [redacted]w[redacted]d presented for PPROM.   S: Breathing through contractions. Some are stronger than others. Passed a clot in the toilet and is wondering if this is something to be worried about. FOB at bedside. No other concerns at this time.  O:  BP 125/66   Pulse (!) 101   Temp 97.8 F (36.6 C) (Oral)   Resp (!) 22   Ht 5\' 4"  (1.626 m)   Wt 111.1 kg   LMP 06/24/2020 (Exact Date)   SpO2 100%   BMI 42.05 kg/m   EFM: Baseline 125 bpm, moderate variability, + accels, no decels  CVE: Dilation: 3 Effacement (%): 90 Station: -2 Presentation: Vertex Exam by:: Dr. [email or web address]   A&P: 25 y.o. 30 [redacted]w[redacted]d   #Labor: Stable. SVE similar to prior. Some contractions now mild in nature. Some are still strong. Will continue to monitor and reassess in 2-3 hours. If unchanged, will plan to transfer to antepartum. Small clot passed; no active bleeding. Will continue to monitor. #Pain: Fentanyl PRN #FWB: Cat 1  #GBS negative per PCR, continue Ampicillin for latency   [redacted]w[redacted]d, MD 11:57 PM

## 2021-01-29 NOTE — H&P (Addendum)
OBSTETRIC ADMISSION HISTORY AND PHYSICAL  Pamela Mccarty is a 25 y.o. female G3P0020 with IUP at [redacted]w[redacted]d by LMP c/w 6 week [place] presenting for PPROM. She had ROM around 1600 with clear fluid. Started having contractions shortly afterwards. She reports +FMs, no VB, no blurry vision, headaches or peripheral edema, and RUQ pain.  She plans on breast feeding. She request IUD outpatient for birth control. She received her prenatal care at [facility]   Dating: By LMP --->  Estimated Date of Delivery: 03/31/21   Prenatal History/Complications:  --History of opioid use disorder, not currently  --Seizure disorder: previously on Keppra however self discontinued this medication around 10/2020 due to concern it was making her seizures more frequent. Previous normal EEG. Has not followed up with Neurology since 2021.  --Anxiety and depression, not on medication  --THC use    Past Medical History: Past Medical History:  Diagnosis Date   ADHD (attention deficit hyperactivity disorder)    Anxiety    Depression    Epilepsy (HCC)    Headache(784.0)    Obesity    Seizures (HCC)    last seizure 08/29/19   Seizures (HCC)     Past Surgical History: Past Surgical History:  Procedure Laterality Date   ADENOIDECTOMY     ANKLE SURGERY     ANKLE SURGERY  at 25yo   Pt. reports to lengthen her tendons   TONSILLECTOMY      Obstetrical History: OB History     Gravida  3   Para      Term      Preterm      AB  2   Living  0      SAB  2   IAB      Ectopic      Multiple  1   Live Births  0           Social History Social History   Socioeconomic History   Marital status: Media planner    Spouse name: Pamela Mccarty   Number of children: Not on file   Years of education: Not on file   Highest education level: Not on file  Occupational History   Occupation: Full Time    Comment: [employer]  Tobacco Use   Smoking status: Former    Packs/day: 0.10    Types: Cigarettes    Quit date:  01/13/2016    Years since quitting: 5.0   Smokeless tobacco: Never  Vaping Use   Vaping Use: Never used  Substance and Sexual Activity   Alcohol use: No   Drug use: Yes    Frequency: 7.0 times per week    Types: Marijuana   Sexual activity: Yes    Birth control/protection: None    Comment: reports she is "gay", has had a boyfriend in past, but states she is only interested in girls, does not want mom to know  Other Topics Concern   Not on file  Social History Narrative   Lives with Pamela Mccarty and his mom and dad   Left Handed   Drinks 7-8 cups caffeine   Social Determinants of Health   Financial Resource Strain: Low Risk    Difficulty of Paying Living Expenses: Not hard at all  Food Insecurity: Food Insecurity Present   Worried About Programme researcher, broadcasting/film/video in the Last Year: Never true   [employer] of Food in the Last Year: Sometimes true  Transportation Needs: No Transportation Needs   Lack of Transportation (Medical): No  Lack of Transportation (Non-Medical): No  Physical Activity: Insufficiently Active   Days of Exercise per Week: 2 days   Minutes of Exercise per Session: 60 min  Stress: No Stress Concern Present   Feeling of Stress : Not at all  Social Connections: Socially Integrated   Frequency of Communication with Friends and Family: More than three times a week   Frequency of Social Gatherings with Friends and Family: More than three times a week   Attends Religious Services: More than 4 times per year   Active Member of [employer] or Organizations: No   Attends Engineer, structural: More than 4 times per year   Marital Status: Living with partner    Family History: Family History  Problem Relation Age of Onset   Bipolar disorder Mother    Cancer Mother    Diabetes Mother    Hypertension Mother    Seizures Mother    Hypertension Father    Stroke Father    Seizures Father    Seizures Brother    Heart disease Maternal Grandfather    Heart attack Maternal  Grandfather     Allergies: Allergies  Allergen Reactions   Peanut-Containing Drug Products Anaphylaxis    Stomach bubbles with Peanut oil only  (Patient states that she is not allergic to this)   Latex    Soy Allergy     hives   Tomato     Throat swelling, shortness of breath     Medications Prior to Admission  Medication Sig Dispense Refill Last Dose   Blood Pressure Monitor MISC For regular home bp monitoring during pregnancy (Patient not taking: No sig reported) 1 each 0    Doxylamine-Pyridoxine (DICLEGIS) 10-10 MG TBEC 2 tabs q hs, if sx persist add 1 tab q am on day 3, if sx persist add 1 tab q afternoon on day 4 100 tablet 6    levETIRAcetam (KEPPRA) 750 MG tablet Take 1 tablet (750 mg total) by mouth 2 (two) times daily. (Patient not taking: No sig reported) 60 tablet 1    nitrofurantoin, macrocrystal-monohydrate, (MACROBID) 100 MG capsule Take 1 capsule (100 mg total) by mouth 2 (two) times daily. X 7 days (Patient not taking: Reported on 01/23/2021) 14 capsule 0    Prenatal MV & Min w/FA-DHA ([employer] PRENATAL GUMMY PO) Take 150 mg by mouth 2 (two) times daily.        Review of Systems   All systems reviewed and negative except as stated in HPI  Blood pressure 128/69, pulse 86, temperature 99 F (37.2 C), temperature source Oral, resp. rate 20, height 5\' 4"  (1.626 m), weight 111.1 kg, last menstrual period 06/24/2020, SpO2 100 %, unknown if currently breastfeeding. General appearance: alert, cooperative, and mild distress with contractions about every 5 minutes  Lungs: Normal WOB  Heart: regular rate and rhythm Abdomen: soft, non-tender Pelvic: NEFG Extremities: Homans sign is negative, no sign of DVT Presentation: cephalic (by cervical check and BSUS) Fetal monitoringBaseline: 145 bpm, Variability: Good {> 6 bpm), Accelerations: Reactive, and Decelerations: Variable: occasional, shallow Uterine activityFrequency: Every 4-5 minutes however not picking up well on toco       Prenatal labs: ABO, Rh: B/Positive/-- (06/13 1051) Antibody: Negative (09/20 0804) Rubella: 2.94 (06/13 1051) RPR: Non Reactive (09/20 0804)  HBsAg: Negative (06/13 1051)  HIV: Non Reactive (09/20 0804)  GBS:   Unknown, culture collected  2 hr Glucola normal  Genetic screening  LR Nips  Anatomy [birth year]1996 normal   Prenatal  Transfer Tool  Maternal Diabetes: No Genetic Screening: Normal Maternal Ultrasounds/Referrals: Normal Fetal Ultrasounds or other Referrals:  None Maternal Substance Abuse:  Yes:  Type: Marijuana Significant Maternal Medications:  None Significant Maternal Lab Results: None  Results for orders placed or performed during the hospital encounter of 01/29/21 (from the past 24 hour(s))  Basic metabolic panel   Collection Time: 01/29/21  4:57 PM  Result Value Ref Range   Sodium 135 135 - 145 mmol/L   Potassium 3.3 (L) 3.5 - 5.1 mmol/L   Chloride 108 98 - 111 mmol/L   CO2 22 22 - 32 mmol/L   Glucose, Bld 117 (H) 70 - 99 mg/dL   BUN 7 6 - 20 mg/dL   Creatinine, Ser 1.88 0.44 - 1.00 mg/dL   Calcium 8.6 (L) 8.9 - 10.3 mg/dL   GFR, Estimated >41 >66 mL/min   Anion gap 5 5 - 15  CBC with Differential   Collection Time: 01/29/21  4:57 PM  Result Value Ref Range   WBC 14.3 (H) 4.0 - 10.5 K/uL   RBC 4.32 3.87 - 5.11 MIL/uL   Hemoglobin 13.4 12.0 - 15.0 g/dL   HCT 06.3 01.6 - 01.0 %   MCV 89.6 80.0 - 100.0 fL   MCH 31.0 26.0 - 34.0 pg   MCHC 34.6 30.0 - 36.0 g/dL   RDW 93.2 35.5 - 73.2 %   Platelets 238 150 - 400 K/uL   nRBC 0.0 0.0 - 0.2 %   Neutrophils Relative % 79 %   Neutro Abs 11.3 (H) 1.7 - 7.7 K/uL   Lymphocytes Relative 12 %   Lymphs Abs 1.7 0.7 - 4.0 K/uL   Monocytes Relative 7 %   Monocytes Absolute 1.1 (H) 0.1 - 1.0 K/uL   Eosinophils Relative 1 %   Eosinophils Absolute 0.1 0.0 - 0.5 K/uL   Basophils Relative 0 %   Basophils Absolute 0.0 0.0 - 0.1 K/uL   Immature Granulocytes 1 %   Abs Immature Granulocytes 0.07 0.00 - 0.07 K/uL  Resp Panel by  RT-PCR (Flu A&B, Covid) Nasopharyngeal Swab   Collection Time: 01/29/21  4:59 PM   Specimen: Nasopharyngeal Swab; Nasopharyngeal(NP) swabs in vial transport medium  Result Value Ref Range   SARS Coronavirus 2 by RT PCR NEGATIVE NEGATIVE   Influenza A by PCR NEGATIVE NEGATIVE   Influenza B by PCR NEGATIVE NEGATIVE    Patient Active Problem List   Diagnosis Date Noted   Preterm premature rupture of membranes (PPROM) with unknown onset of labor 01/29/2021   Marijuana use 09/19/2020   Encounter for supervision of normal pregnancy, antepartum 09/18/2020   Depression with anxiety 09/18/2020   Seizure disorder (HCC) 02/27/2018   Opioid use disorder, severe, dependence (HCC) 12/10/2015   Suicidal ideation    MDD (major depressive disorder), recurrent severe, 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 G3P0020 at [redacted]w[redacted]d here for PPROM.   #PPROM: Clear around 1600. Initially felt to be 6cm while in the MAU, however on recheck around 2/90/-3 (confirmed by Dr. Miquel Dunn as well). Started on IV mag bolus + infusion, BMZ, amp, and azithromycin. Gc/ch collected in the MAU, abnormal odor to vaginal discharge. Plan to continue monitoring and recheck at midnight (if not needed sooner), if unchanged may transfer to ante. NICU notified.  #Pain: IV PRN  #FWB: Cat II with occasional variables, however otherwise reassuring  for gestational age #ID: GBS unknown, on antibiotics as above.  #MOF: Breast #MOC: IUD outpatient  #Circ: NA female   #History of opioid/THC use  Anxiety/depression: UDS ordered. Consider SW consult pp.   #Seizure disorder: No recent seizure like activity. Not currently on medication. Should follow up with neurology at discharge.   Allayne Stack, DO  01/29/2021, 7:00 PM

## 2021-01-29 NOTE — ED Notes (Signed)
OB rapid response called and currently monitoring.

## 2021-01-29 NOTE — ED Provider Notes (Signed)
[facility] EMERGENCY DEPARTMENT Provider Note   CSN: [id number] Arrival date & time: 01/29/21  1646     History No chief complaint on file.   Pamela Mccarty is a 25 y.o. female.  She has a history of seizures.  She is [redacted] weeks pregnant and [facility].  She started with some abdominal crampy pain that she thought was contractions last night.  This morning went to the bathroom and had a huge gush of fluid and has been leaking fluid since then.  Low abdominal cramping is continued.  No vaginal bleeding.  No fevers.  The history is provided by the patient.  Abdominal Pain Pain location:  Suprapubic Pain quality: cramping   Pain severity:  Moderate Onset quality:  Gradual Duration:  2 days Timing:  Intermittent Progression:  Unchanged Chronicity:  New Relieved by:  Nothing Worsened by:  Nothing Ineffective treatments:  None tried Associated symptoms: no chest pain, no cough, no diarrhea, no dysuria, no fever, no shortness of breath, no sore throat and no vomiting   Risk factors: pregnancy       Past Medical History:  Diagnosis Date   ADHD (attention deficit hyperactivity disorder)    Anxiety    Depression    Epilepsy (HCC)    Headache(784.0)    Obesity    Seizures (HCC)    last seizure 08/29/19   Seizures (HCC)     Patient Active Problem List   Diagnosis Date Noted   Marijuana use 09/19/2020   Encounter for supervision of normal pregnancy, antepartum 09/18/2020   Depression with anxiety 09/18/2020   Seizure disorder (HCC) 02/27/2018   Opioid use disorder, severe, dependence (HCC) 12/10/2015   Suicidal ideation    MDD (major depressive disorder), recurrent severe, 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 reports to lengthen  her tendons   TONSILLECTOMY       OB History     Gravida  3   Para      Term      Preterm      AB  2   Living  0      SAB  2   IAB      Ectopic      Multiple  1   Live Births  0           Family History  Problem Relation Age of Onset   Bipolar disorder Mother    Cancer Mother    Diabetes Mother    Hypertension Mother    Seizures Mother    Hypertension Father    Stroke Father    Seizures Father    Seizures Brother    Heart disease Maternal Grandfather    Heart attack Maternal Grandfather     Social History   Tobacco Use   Smoking status: Former    Packs/day: 0.10    Types: Cigarettes    Quit date: 01/13/2016    Years since quitting: 5.0   Smokeless tobacco: Never  Vaping Use   Vaping Use: Never used  Substance Use Topics   Alcohol use: No   Drug use: Yes    Frequency: 7.0 times per week    Types: Marijuana    Home Medications Prior to Admission medications  Medication Sig Start Date End Date Taking? Authorizing Provider  Blood Pressure Monitor MISC For regular home bp monitoring during pregnancy Patient not taking: No sig reported 09/18/20   Cheral Marker, CNM  Doxylamine-Pyridoxine (DICLEGIS) 10-10 MG TBEC 2 tabs q hs, if sx persist add 1 tab q am on day 3, if sx persist add 1 tab q afternoon on day 4 09/18/20   Cheral Marker, CNM  levETIRAcetam (KEPPRA) 750 MG tablet Take 1 tablet (750 mg total) by mouth 2 (two) times daily. Patient not taking: No sig reported 03/08/20   Sabas Sous, MD  nitrofurantoin, macrocrystal-monohydrate, (MACROBID) 100 MG capsule Take 1 capsule (100 mg total) by mouth 2 (two) times daily. X 7 days Patient not taking: Reported on 01/23/2021 01/19/21   Cheral Marker, CNM  Prenatal MV & Min w/FA-DHA ([employer] PRENATAL GUMMY PO) Take 150 mg by mouth 2 (two) times daily.    [provider]    Allergies    Peanut-containing drug products, Latex, Soy allergy, and Tomato  Review of Systems    Review of Systems  Constitutional:  Negative for fever.  HENT:  Negative for sore throat.   Eyes:  Negative for visual disturbance.  Respiratory:  Negative for cough and shortness of breath.   Cardiovascular:  Negative for chest pain.  Gastrointestinal:  Positive for abdominal pain. Negative for diarrhea and vomiting.  Genitourinary:  Negative for dysuria.  Musculoskeletal:  Negative for back pain.  Skin:  Negative for rash.  Neurological:  Negative for headaches.   Physical Exam Updated Vital Signs BP 130/75   Pulse (!) 105   Temp (!) 97.4 F (36.3 C) (Oral)   Resp 18   Ht 5\' 4"  (1.626 m)   Wt 111.1 kg   LMP 06/24/2020 (Exact Date)   SpO2 100%   BMI 42.05 kg/m   Physical Exam Vitals and nursing note reviewed.  Constitutional:      General: She is not in acute distress.    Appearance: Normal appearance. She is well-developed.  HENT:     Head: Normocephalic and atraumatic.  Eyes:     Conjunctiva/sclera: Conjunctivae normal.  Cardiovascular:     Rate and Rhythm: Normal rate and regular rhythm.     Heart sounds: No murmur heard. Pulmonary:     Effort: Pulmonary effort is normal. No respiratory distress.     Breath sounds: Normal breath sounds.  Abdominal:     Palpations: Abdomen is soft.     Tenderness: There is abdominal tenderness. There is no guarding or rebound.  Musculoskeletal:        General: Normal range of motion.     Cervical back: Neck supple.     Right lower leg: No edema.     Left lower leg: No edema.  Skin:    General: Skin is warm and dry.  Neurological:     General: No focal deficit present.     Mental Status: She is alert.    ED Results / Procedures / Treatments   Labs (all labs ordered are listed, but only abnormal results are displayed) Labs Reviewed  BASIC METABOLIC PANEL - Abnormal; Notable for the following components:      Result Value   Potassium 3.3 (*)    Glucose, Bld 117 (*)    Calcium 8.6 (*)    All other components within  normal limits  CBC WITH DIFFERENTIAL/PLATELET - Abnormal; Notable for the following components:   WBC 14.3 (*)  Neutro Abs 11.3 (*)    Monocytes Absolute 1.1 (*)    All other components within normal limits  URINALYSIS, ROUTINE W REFLEX MICROSCOPIC - Abnormal; Notable for the following components:   APPearance HAZY (*)    Hgb urine dipstick MODERATE (*)    Leukocytes,Ua LARGE (*)    WBC, UA >50 (*)    Bacteria, UA RARE (*)    All other components within normal limits  RAPID URINE DRUG SCREEN, HOSP PERFORMED - Abnormal; Notable for the following components:   Tetrahydrocannabinol POSITIVE (*)    All other components within normal limits  CBC - Abnormal; Notable for the following components:   WBC 17.5 (*)    All other components within normal limits  RESP PANEL BY RT-PCR (FLU A&B, COVID) ARPGX2  GROUP B STREP BY PCR  RPR  KLEIHAUER-BETKE STAIN  TYPE AND SCREEN  GC/CHLAMYDIA PROBE AMP ([place]) NOT AT [facility]    EKG None  Radiology No results found.  Procedures Procedures   Medications Ordered in ED Medications  lactated ringers infusion ( Intravenous New Bag/Given 01/30/21 0910)  oxytocin (PITOCIN) IV BOLUS FROM BAG (has no administration in time range)  oxytocin (PITOCIN) IV infusion 30 units in NS 500 mL - Premix (has no administration in time range)  lactated ringers infusion 500-1,000 mL (has no administration in time range)  oxyCODONE-acetaminophen (PERCOCET/ROXICET) 5-325 MG per tablet 1 tablet (has no administration in time range)  oxyCODONE-acetaminophen (PERCOCET/ROXICET) 5-325 MG per tablet 2 tablet (has no administration in time range)  ondansetron (ZOFRAN) injection 4 mg (4 mg Intravenous Given 01/29/21 1939)  sodium citrate-citric acid (ORACIT) solution 30 mL (has no administration in time range)  lidocaine (PF) (XYLOCAINE) 1 % injection 30 mL (has no administration in time range)  ampicillin (OMNIPEN) 2 g in sodium chloride 0.9 % 100 mL IVPB (  Intravenous Stopped 01/30/21 0802)    Followed by  amoxicillin (AMOXIL) capsule 500 mg (has no administration in time range)  magnesium sulfate 40 grams in SWI 1000 mL OB infusion (2 g/hr Intravenous New Bag/Given 01/30/21 0908)  betamethasone acetate-betamethasone sodium phosphate (CELESTONE) injection 12 mg (12 mg Intramuscular Given 01/29/21 1926)  fentaNYL (SUBLIMAZE) injection 50-100 mcg (100 mcg Intravenous Given 01/30/21 0813)  acetaminophen (TYLENOL) tablet 650 mg (has no administration in time range)  zolpidem (AMBIEN) tablet 5 mg (has no administration in time range)  docusate sodium (COLACE) capsule 100 mg (has no administration in time range)  calcium carbonate (TUMS - dosed in mg elemental calcium) chewable tablet 400 mg of elemental calcium (has no administration in time range)  prenatal multivitamin tablet 1 tablet (has no administration in time range)  sodium chloride 0.9 % bolus 1,000 mL (1,000 mLs Intravenous New Bag/Given 01/29/21 1703)  azithromycin (ZITHROMAX) tablet 1,000 mg (1,000 mg Oral Given 01/29/21 1904)  magnesium bolus via infusion 4 g (4 g Intravenous Bolus from Bag 01/29/21 1906)  promethazine (PHENERGAN) 25 mg in sodium chloride 0.9 % 50 mL IVPB (0 mg Intravenous Stopped 01/30/21 0152)    ED Course  I have reviewed the triage vital signs and the nursing notes.  Pertinent labs & imaging results that were available during my care of the patient were reviewed by me and considered in my medical decision making (see chart for details).  Clinical Course as of 01/30/21 1029  Mon Jan 29, 2021  1708 Bedside ultrasound showing transverse lie.  Positive cardiac activity.  Tech placing patient on toco.  OB/GYN paged [MB]  [phone]  Discussed with Dr. Despina Hidden.  He accepts the patient to the MAU.  Discussed with Dr. Adrian Blackwater at [facility].  [facility] on the way for transport. [MB]  1801 [facility] is here for transport of patient. [MB]    Clinical Course User Index [MB] Terrilee Files, MD   MDM Rules/Calculators/A&P                          Clinical Course as of 01/30/21 1029  Mon Jan 29, 2021  1708 Bedside ultrasound showing transverse lie.  Positive cardiac activity.  Tech placing patient on toco.  OB/GYN paged [MB]  [phone] Discussed with Dr. Despina Hidden.  He accepts the patient to the MAU.  Discussed with Dr. Adrian Blackwater at [facility].  [facility] on the way for transport. [MB]  1801 [facility] is here for transport of patient. [MB]    Clinical Course User Index [MB] Terrilee Files, MD  This patient complains of pregnancy water broke; this involves an extensive number of treatment Options and is a complaint that carries with it a high risk of complications and Morbidity. The differential includes premature rupture of membranes, contractions, early labor  I ordered, reviewed and interpreted labs, which included CBC with elevated white count normal hemoglobin, chemistries mildly low potassium elevated glucose, COVID and flu negative I ordered medication IV fluids I ordered imaging studies which included bedside ultrasound and I independently    visualized and interpreted imaging which showed transverse presentation Additional history obtained from patient significant other Previous records obtained and reviewed in epic no recent admissions I consulted Dr. Despina Hidden and Dr. Gust Rung will be and discussed lab and imaging findings  Critical Interventions: None  After the interventions stated above, I reevaluated the patient and found patient to be hemodynamically stable.  She will be transferred by [facility] critical care to [facility] for further OB evaluation.   Final Clinical Impression(s) / ED Diagnoses Final diagnoses:  Preterm premature rupture of membranes in third trimester, unspecified duration to onset of labor    Rx / DC Orders ED Discharge Orders     None        Terrilee Files, MD 01/30/21 1035

## 2021-01-29 NOTE — ED Triage Notes (Signed)
Pt arrived via POV. Pt states that she went to the restroom and when she stood up "it felt like a river [id number] between my legs" with increased contractions. Complains of abdominal cramps since last night. Pt is [redacted] weeks pregnant.

## 2021-01-29 NOTE — MAU Note (Addendum)
Pamela Mccarty is a 25 y.o. at [redacted]w[redacted]d here in MAU reporting: arrived via EMS complaining of LOF and contractions. States water broke at 1615 and was clear.   Dr Adrian Blackwater at bedside for [facility] arrival. Pt grossly ruptured. GBS and GC/Chlamydia collected. SVE performed and pt is 6cm. Vtx by BSUS. Pt to room 217 via stretcher with Dr Adrian Blackwater.  FHT's 150's.  Onset of complaint: today  Vitals:   01/29/21 1730 01/29/21 1845  BP: 131/76 128/69  Pulse: 98 86  Resp: (!) 23 20  Temp:  99 F (37.2 C)  SpO2: 99% 100%     Lab orders placed from triage: none

## 2021-01-29 NOTE — OB Triage Provider Note (Signed)
History     CSN: [id number]  Arrival date and time: 01/29/21 1646   Chief Complaint  Patient presents with  . Rupture of Membranes  . Contractions   HPI 25yo G3P0020 at [redacted]w[redacted]d who was seen at [facility] hospital for SROM. SROM at 4pm. She was transferred here. She has been having contractions that started last night night. Now, ctxs every 3-4 minutes. Having continuous leaking of fluid.  OB History     Gravida  3   Para      Term      Preterm      AB  2   Living  0      SAB  2   IAB      Ectopic      Multiple  1   Live Births  0           Past Medical History:  Diagnosis Date  . ADHD (attention deficit hyperactivity disorder)   . Anxiety   . Depression   . Epilepsy (HCC)   . Headache(784.0)   . Obesity   . Seizures (HCC)    last seizure 08/29/19  . Seizures (HCC)     Past Surgical History:  Procedure Laterality Date  . ADENOIDECTOMY    . ANKLE SURGERY    . ANKLE SURGERY  at 25yo   Pt. reports to lengthen her tendons  . TONSILLECTOMY      Family History  Problem Relation Age of Onset  . Bipolar disorder Mother   . Cancer Mother   . Diabetes Mother   . Hypertension Mother   . Seizures Mother   . Hypertension Father   . Stroke Father   . Seizures Father   . Seizures Brother   . Heart disease Maternal Grandfather   . Heart attack Maternal Grandfather     Social History   Tobacco Use  . Smoking status: Former    Packs/day: 0.10    Types: Cigarettes    Quit date: 01/13/2016    Years since quitting: 5.0  . Smokeless tobacco: Never  Vaping Use  . Vaping Use: Never used  Substance Use Topics  . Alcohol use: No  . Drug use: Yes    Frequency: 7.0 times per week    Types: Marijuana    Allergies:  Allergies  Allergen Reactions  . Peanut-Containing Drug Products Anaphylaxis    Stomach bubbles with Peanut oil only  (Patient states that she is not allergic to this)  . Latex   . Soy Allergy     hives  . Tomato     Throat swelling,  shortness of breath     Medications Prior to Admission  Medication Sig Dispense Refill Last Dose  . Blood Pressure Monitor MISC For regular home bp monitoring during pregnancy (Patient not taking: No sig reported) 1 each 0   . Doxylamine-Pyridoxine (DICLEGIS) 10-10 MG TBEC 2 tabs q hs, if sx persist add 1 tab q am on day 3, if sx persist add 1 tab q afternoon on day 4 100 tablet 6   . levETIRAcetam (KEPPRA) 750 MG tablet Take 1 tablet (750 mg total) by mouth 2 (two) times daily. (Patient not taking: No sig reported) 60 tablet 1   . nitrofurantoin, macrocrystal-monohydrate, (MACROBID) 100 MG capsule Take 1 capsule (100 mg total) by mouth 2 (two) times daily. X 7 days (Patient not taking: Reported on 01/23/2021) 14 capsule 0   . Prenatal MV & Min w/FA-DHA ([employer] PRENATAL GUMMY PO) Take  150 mg by mouth 2 (two) times daily.       Review of Systems Physical Exam   Blood pressure (!) 148/80, pulse 96, temperature 98.3 F (36.8 C), temperature source Oral, resp. rate 18, height 5\' 4"  (1.626 m), weight 111.1 kg, last menstrual period 06/24/2020, SpO2 100 %, unknown if currently breastfeeding.  Physical Exam Vitals reviewed. Exam conducted with a chaperone present.  Constitutional:      Appearance: Normal appearance.  Cardiovascular:     Pulses: Normal pulses.  Pulmonary:     Effort: Pulmonary effort is normal.  Abdominal:     General: Abdomen is flat.  Neurological:     Mental Status: She is alert.  Psychiatric:        Mood and Affect: Mood normal.        Behavior: Behavior normal.        Thought Content: Thought content normal.        Judgment: Judgment normal.   Cervix - 6cm dilated   MAU Course  Procedures Pt informed that the ultrasound is considered a limited OB ultrasound and is not intended to be a complete ultrasound exam.  Patient also informed that the ultrasound is not being completed with the intent of assessing for fetal or placental anomalies or any pelvic abnormalities.   Explained that the purpose of today's ultrasound is to assess for  presentation.  Patient acknowledges the purpose of the exam and the limitations of the study.    Vertex.  MDM   Assessment and Plan  Admit Start magnesium BMZ Amp/azithromycin Labor team notified. Neonatologist notified.  06/26/2020 01/29/2021, 7:15 PM

## 2021-01-29 NOTE — ED Notes (Signed)
OB rapid response nurse Corrie Dandy stated that Dr. Vergie Living wants pt transferred for further evaluation. [facility] en route.

## 2021-01-30 ENCOUNTER — Inpatient Hospital Stay (HOSPITAL_COMMUNITY): Payer: Medicaid Other | Admitting: Anesthesiology

## 2021-01-30 ENCOUNTER — Encounter (HOSPITAL_COMMUNITY): Payer: Self-pay | Admitting: Obstetrics and Gynecology

## 2021-01-30 DIAGNOSIS — O42913 Preterm premature rupture of membranes, unspecified as to length of time between rupture and onset of labor, third trimester: Secondary | ICD-10-CM

## 2021-01-30 DIAGNOSIS — Z3A31 31 weeks gestation of pregnancy: Secondary | ICD-10-CM

## 2021-01-30 LAB — KLEIHAUER-BETKE STAIN
Fetal Cells %: 0 %
Quantitation Fetal Hemoglobin: 0 mL

## 2021-01-30 LAB — CBC WITH DIFFERENTIAL/PLATELET
Abs Immature Granulocytes: 0.09 10*3/uL — ABNORMAL HIGH (ref 0.00–0.07)
Basophils Absolute: 0 10*3/uL (ref 0.0–0.1)
Basophils Relative: 0 %
Eosinophils Absolute: 0 10*3/uL (ref 0.0–0.5)
Eosinophils Relative: 0 %
HCT: 35.8 % — ABNORMAL LOW (ref 36.0–46.0)
Hemoglobin: 12.3 g/dL (ref 12.0–15.0)
Immature Granulocytes: 1 %
Lymphocytes Relative: 6 %
Lymphs Abs: 1 10*3/uL (ref 0.7–4.0)
MCH: 30.3 pg (ref 26.0–34.0)
MCHC: 34.4 g/dL (ref 30.0–36.0)
MCV: 88.2 fL (ref 80.0–100.0)
Monocytes Absolute: 0.7 10*3/uL (ref 0.1–1.0)
Monocytes Relative: 4 %
Neutro Abs: 16.6 10*3/uL — ABNORMAL HIGH (ref 1.7–7.7)
Neutrophils Relative %: 89 %
Platelets: 263 10*3/uL (ref 150–400)
RBC: 4.06 MIL/uL (ref 3.87–5.11)
RDW: 12.7 % (ref 11.5–15.5)
WBC: 18.5 10*3/uL — ABNORMAL HIGH (ref 4.0–10.5)
nRBC: 0 % (ref 0.0–0.2)

## 2021-01-30 LAB — GC/CHLAMYDIA PROBE AMP (~~LOC~~) NOT AT ARMC
Chlamydia: NEGATIVE
Comment: NEGATIVE
Comment: NORMAL
Neisseria Gonorrhea: NEGATIVE

## 2021-01-30 LAB — CBC
HCT: 37.8 % (ref 36.0–46.0)
Hemoglobin: 12.9 g/dL (ref 12.0–15.0)
MCH: 30 pg (ref 26.0–34.0)
MCHC: 34.1 g/dL (ref 30.0–36.0)
MCV: 87.9 fL (ref 80.0–100.0)
Platelets: 234 10*3/uL (ref 150–400)
RBC: 4.3 MIL/uL (ref 3.87–5.11)
RDW: 12.9 % (ref 11.5–15.5)
WBC: 17.5 10*3/uL — ABNORMAL HIGH (ref 4.0–10.5)
nRBC: 0 % (ref 0.0–0.2)

## 2021-01-30 LAB — RPR: RPR Ser Ql: NONREACTIVE

## 2021-01-30 MED ORDER — DIPHENHYDRAMINE HCL 25 MG PO CAPS: 25.0000 mg | ORAL_CAPSULE | Freq: Four times a day (QID) | ORAL | Status: DC | PRN

## 2021-01-30 MED ORDER — DIBUCAINE (PERIANAL) 1 % EX OINT: 1.0000 "application " | TOPICAL_OINTMENT | CUTANEOUS | Status: DC | PRN

## 2021-01-30 MED ORDER — FENTANYL-BUPIVACAINE-NACL 0.5-0.125-0.9 MG/250ML-% EP SOLN
EPIDURAL | Status: AC
  Filled 2021-01-30: qty 250

## 2021-01-30 MED ORDER — PHENYLEPHRINE 40 MCG/ML (10ML) SYRINGE FOR IV PUSH (FOR BLOOD PRESSURE SUPPORT): 80.0000 ug | PREFILLED_SYRINGE | INTRAVENOUS | Status: DC | PRN

## 2021-01-30 MED ORDER — SENNOSIDES-DOCUSATE SODIUM 8.6-50 MG PO TABS
2.0000 | ORAL_TABLET | Freq: Every day | ORAL | Status: DC
  Filled 2021-01-30 (×2): qty 2

## 2021-01-30 MED ORDER — ZOLPIDEM TARTRATE 5 MG PO TABS: 5.0000 mg | ORAL_TABLET | Freq: Every evening | ORAL | Status: DC | PRN

## 2021-01-30 MED ORDER — MEASLES, MUMPS & RUBELLA VAC IJ SOLR: 0.5000 mL | Freq: Once | INTRAMUSCULAR | Status: DC

## 2021-01-30 MED ORDER — BENZOCAINE-MENTHOL 20-0.5 % EX AERO
1.0000 "application " | INHALATION_SPRAY | CUTANEOUS | Status: DC | PRN
  Administered 2021-01-31: 1 via TOPICAL
  Filled 2021-01-30: qty 56

## 2021-01-30 MED ORDER — IBUPROFEN 600 MG PO TABS
600.0000 mg | ORAL_TABLET | Freq: Four times a day (QID) | ORAL | Status: DC
  Administered 2021-01-31 – 2021-02-01 (×7): 600 mg via ORAL
  Filled 2021-01-30 (×7): qty 1

## 2021-01-30 MED ORDER — SIMETHICONE 80 MG PO CHEW
80.0000 mg | CHEWABLE_TABLET | ORAL | Status: DC | PRN
  Filled 2021-01-30: qty 1

## 2021-01-30 MED ORDER — LIDOCAINE HCL (PF) 1 % IJ SOLN
INTRAMUSCULAR | Status: DC | PRN
  Administered 2021-01-30: 5 mL via EPIDURAL

## 2021-01-30 MED ORDER — ONDANSETRON HCL 4 MG/2ML IJ SOLN: 4.0000 mg | INTRAMUSCULAR | Status: DC | PRN

## 2021-01-30 MED ORDER — EPHEDRINE 5 MG/ML INJ: 10.0000 mg | INTRAVENOUS | Status: DC | PRN

## 2021-01-30 MED ORDER — LACTATED RINGERS IV SOLN: 500.0000 mL | Freq: Once | INTRAVENOUS | Status: DC

## 2021-01-30 MED ORDER — FENTANYL-BUPIVACAINE-NACL 0.5-0.125-0.9 MG/250ML-% EP SOLN: 12.0000 mL/h | EPIDURAL | Status: DC | PRN

## 2021-01-30 MED ORDER — DOCUSATE SODIUM 100 MG PO CAPS
100.0000 mg | ORAL_CAPSULE | Freq: Every day | ORAL | Status: DC
  Filled 2021-01-30 (×2): qty 1

## 2021-01-30 MED ORDER — CALCIUM CARBONATE ANTACID 500 MG PO CHEW: 2.0000 | CHEWABLE_TABLET | ORAL | Status: DC | PRN

## 2021-01-30 MED ORDER — PHENYLEPHRINE 40 MCG/ML (10ML) SYRINGE FOR IV PUSH (FOR BLOOD PRESSURE SUPPORT)
80.0000 ug | PREFILLED_SYRINGE | INTRAVENOUS | Status: DC | PRN
Start: 2021-01-30 — End: 2021-01-30

## 2021-01-30 MED ORDER — TETANUS-DIPHTH-ACELL PERTUSSIS 5-2.5-18.5 LF-MCG/0.5 IM SUSY: 0.5000 mL | PREFILLED_SYRINGE | Freq: Once | INTRAMUSCULAR | Status: DC

## 2021-01-30 MED ORDER — ACETAMINOPHEN 325 MG PO TABS
650.0000 mg | ORAL_TABLET | ORAL | Status: DC | PRN
  Administered 2021-01-31 – 2021-02-01 (×4): 650 mg via ORAL
  Filled 2021-01-30 (×4): qty 2

## 2021-01-30 MED ORDER — PRENATAL MULTIVITAMIN CH
1.0000 | ORAL_TABLET | Freq: Every day | ORAL | Status: DC
  Administered 2021-01-31: 1 via ORAL
  Filled 2021-01-30 (×2): qty 1

## 2021-01-30 MED ORDER — FENTANYL-BUPIVACAINE-NACL 0.5-0.125-0.9 MG/250ML-% EP SOLN
EPIDURAL | Status: DC | PRN
  Administered 2021-01-30: 12 mL/h via EPIDURAL

## 2021-01-30 MED ORDER — ONDANSETRON HCL 4 MG PO TABS: 4.0000 mg | ORAL_TABLET | ORAL | Status: DC | PRN

## 2021-01-30 MED ORDER — DIPHENHYDRAMINE HCL 50 MG/ML IJ SOLN
12.5000 mg | INTRAMUSCULAR | Status: DC | PRN
Start: 2021-01-30 — End: 2021-01-30

## 2021-01-30 MED ORDER — COCONUT OIL OIL
1.0000 "application " | TOPICAL_OIL | Status: DC | PRN
  Administered 2021-01-31: 1 via TOPICAL

## 2021-01-30 MED ORDER — ACETAMINOPHEN 325 MG PO TABS: 650.0000 mg | ORAL_TABLET | ORAL | Status: DC | PRN

## 2021-01-30 MED ORDER — WITCH HAZEL-GLYCERIN EX PADS: 1.0000 "application " | MEDICATED_PAD | CUTANEOUS | Status: DC | PRN

## 2021-01-30 NOTE — Anesthesia Preprocedure Evaluation (Signed)
Anesthesia Evaluation  Patient identified by MRN, date of birth, ID band Patient awake    Reviewed: Allergy & Precautions, NPO status , Patient's Chart, lab work & pertinent test results  Airway Mallampati: III  TM Distance: >3 FB Neck ROM: Full    Dental no notable dental hx. (+) Teeth Intact, Dental Advisory Given   Pulmonary former smoker,    Pulmonary exam normal breath sounds clear to auscultation       Cardiovascular Exercise Tolerance: Good Normal cardiovascular exam Rhythm:Regular Rate:Normal     Neuro/Psych Seizures -,  Anxiety    GI/Hepatic negative GI ROS, (+)     substance abuse  marijuana use,   Endo/Other  Morbid obesity  Renal/GU      Musculoskeletal  (+) narcotic dependent  Abdominal (+) + obese (BMI 42.05),   Peds  Hematology Lab Results      Component                Value               Date                      WBC                      17.5 (H)            01/30/2021                HGB                      12.9                01/30/2021                HCT                      37.8                01/30/2021                MCV                      87.9                01/30/2021                PLT                      234                 01/30/2021              Anesthesia Other Findings   Reproductive/Obstetrics (+) Pregnancy                             Anesthesia Physical Anesthesia Plan  ASA: 3  Anesthesia Plan: Epidural   Post-op Pain Management:    Induction:   PONV Risk Score and Plan:   Airway Management Planned:   Additional Equipment:   Intra-op Plan:   Post-operative Plan:   Informed Consent: I have reviewed the patients History and Physical, chart, labs and discussed the procedure including the risks, benefits and alternatives for the proposed anesthesia with the patient or authorized representative who has indicated his/her understanding and  acceptance.       Plan  Discussed with:   Anesthesia Plan Comments: (31.4 wk g3P0 BMI 42 w substance abuse hx  For LEA)        Anesthesia Quick Evaluation

## 2021-01-30 NOTE — Progress Notes (Signed)
Patient ID: Pamela Mccarty, female   DOB: September 18, 1995, 25 y.o.   MRN: [id number] Labor Progress Note [name] is a 25 y.o. G3P0020 at [redacted]w[redacted]d admitted for PPROM. S: Called into room due to patient reporting increased pressure and pain and the urge to push.   O:  BP (!) 119/54   Pulse 97   Temp 99.1 F (37.3 C) (Axillary)   Resp 18   Ht 5\' 4"  (1.626 m)   Wt 111.1 kg   LMP 06/24/2020 (Exact Date)   SpO2 100%   BMI 42.05 kg/m  EFM: moderate /accelerations present /no decels Very tender to palpation of fundus.  CVE: Dilation: 4.5 Effacement (%): 90 Station: -1, 0 Presentation: Vertex Exam by:: Dr [email or web address]   A&P: 25 y.o. 30 [redacted]w[redacted]d admitted for PPROM. #PPROM: No fevers. Cervical check continues to be stable. #Pain: Discussed analgesic options at length including epidural. Patient continues to decline epidural at this time. After discussion, opted for an additional dose of IV fentanyl.  #FWB: Category I strip  Concern for marginal placental abruption given her continued vaginal bleeding and tenderness to palpation with ongoing pain. Fetal heart tracing continues to be category I at this time. Will continue to monitor closely.  [redacted]w[redacted]d, MD 4:42 PM

## 2021-01-30 NOTE — Discharge Summary (Signed)
Postpartum Discharge Summary     Patient Name: Pamela Mccarty DOB: [birth year] MRN: [id number]  Date of admission: 01/29/2021 Delivery date:01/30/2021  Delivering provider: Genia Del  Date of discharge: 02/01/2021  Admitting diagnosis: Preterm premature rupture of membranes (PPROM) with unknown onset of labor [O42.919] Intrauterine pregnancy: [redacted]w[redacted]d    Secondary diagnosis:  Principal Problem:   Vaginal delivery Active Problems:   Depression with anxiety   Marijuana use   Preterm premature rupture of membranes (PPROM) with unknown onset of labor  Additional problems: None    Discharge diagnosis: Preterm Pregnancy Delivered                                              Post partum procedures: None Augmentation:  None Complications: None  Hospital course: Onset of Labor With Vaginal Delivery      25y.o. yo G3P0020 at 36w3das admitted in Latent Labor after PPROM on 01/29/2021. Patient was given Ampicillin, Azithromycin, Betamethasone, and was started on IV magnesium with admission to L&D for further monitoring. Patient continued to painfully contract while on labor and delivery and progressed with cervical change. Patient completed her course of steroids and IV magnesium was stopped. She subsequently progressed to complete and had an uncomplicated vaginal delivery as follows:  Membrane Rupture Time/Date: 4:00 PM ,01/29/2021   Delivery Method:Vaginal, Spontaneous  Episiotomy: None  Lacerations:  Labial  Patient had an uncomplicated postpartum course.  She is ambulating, tolerating a regular diet, passing flatus, and urinating well. Patient is discharged home in stable condition on 02/01/21.  Newborn Data: Birth date:01/30/2021  Birth time:8:51 PM  Gender:Female  Living status:Living  Apgars:8 ,9  Weight:1570 g   Magnesium Sulfate received: Yes: Neuroprotection BMZ received: Yes Rhophylac: N/A MMR: N/A - Immune  T-DaP: Given prenatally Flu: Given  prenatally Transfusion: No  Physical exam  Vitals:   01/31/21 1347 01/31/21 1609 01/31/21 1930 02/01/21 0607  BP: 131/82 123/67 127/80 113/67  Pulse: 73 74 83 60  Resp: 18 18 16 18   Temp:  97.8 F (36.6 C) 98.4 F (36.9 C) 98.4 F (36.9 C)  TempSrc:  Oral Oral Oral  SpO2: 100% 100% 99%   Weight:      Height:       General: alert, cooperative, and no distress Lochia: appropriate Uterine Fundus: firm DVT Evaluation: no LE edema or calf tenderness to palpation   Labs: Lab Results  Component Value Date   WBC 18.5 (H) 01/30/2021   HGB 12.3 01/30/2021   HCT 35.8 (L) 01/30/2021   MCV 88.2 01/30/2021   PLT 263 01/30/2021   CMP Latest Ref Rng & Units 01/29/2021  Glucose 70 - 99 mg/dL 117(H)  BUN 6 - 20 mg/dL 7  Creatinine 0.44 - 1.00 mg/dL 0.55  Sodium 135 - 145 mmol/L 135  Potassium 3.5 - 5.1 mmol/L 3.3(L)  Chloride 98 - 111 mmol/L 108  CO2 22 - 32 mmol/L 22  Calcium 8.9 - 10.3 mg/dL 8.6(L)  Total Protein 6.5 - 8.1 g/dL -  Total Bilirubin 0.3 - 1.2 mg/dL -  Alkaline Phos 38 - 126 U/L -  AST 15 - 41 U/L -  ALT 0 - 44 U/L -   Edinburgh Score: No flowsheet data found.   After visit meds:  Allergies as of 02/01/2021       Reactions   Peanut-containing  Drug Products Anaphylaxis   Stomach bubbles with Peanut oil only (Patient states that she is not allergic to this)   Latex    Soy Allergy    hives   Tomato    Throat swelling, shortness of breath        Medication List     STOP taking these medications    Doxylamine-Pyridoxine 10-10 MG Tbec Commonly known as: Diclegis   nitrofurantoin (macrocrystal-monohydrate) 100 MG capsule Commonly known as: MACROBID       TAKE these medications    acetaminophen 500 MG tablet Commonly known as: TYLENOL Take 2 tablets (1,000 mg total) by mouth every 8 (eight) hours as needed (pain).   Blood Pressure Monitor Misc For regular home bp monitoring during pregnancy   [employer] PRENATAL GUMMY PO Take 150 mg by mouth 2  (two) times daily.   ibuprofen 600 MG tablet Commonly known as: ADVIL Take 1 tablet (600 mg total) by mouth every 6 (six) hours as needed (pain).   levETIRAcetam 750 MG tablet Commonly known as: Keppra Take as directed by Neurology. What changed:  how much to take how to take this when to take this additional instructions         Discharge home in stable condition Infant Feeding: Breast Infant Disposition: NICU Discharge instruction: per After Visit Summary and Postpartum booklet. Activity: Advance as tolerated. Pelvic rest for 6 weeks.  Diet: routine diet Future Appointments: Future Appointments  Date Time Provider [place]  03/07/2021 11:30 AM Myrtis Ser, CNM [facility]   Follow up Visit: Message sent to [facility] by Dr. Gwenlyn Perking on 01/30/21.   Please schedule this patient for a In person postpartum visit in 4 weeks with the following provider: Any provider. Additional Postpartum F/U: None   Low risk pregnancy complicated by:  PPROM/PTL Delivery mode:  Vaginal, Spontaneous  Anticipated Birth Control:  IUD outpatient   02/01/2021 Genia Del, MD

## 2021-01-30 NOTE — Plan of Care (Signed)
S: Called into room due to increased VB per RN, with steady stream PV. Patient now comfortable with epidural. Has not progressed with cervical change despite increased frequency of contractions. When walked into room, VB had stopped. Patient and baby stable.  O:  Vitals:   01/30/21 1745 01/30/21 1746  BP:  135/61  Pulse:  (!) 107  Resp:  18  Temp:    SpO2: 99%     SVE: 5/90/-1 (previously)  150bpm/mod var/+accels, no decels  A&P: 25 y.o. G3P0020 at [redacted]w[redacted]d here for PPROM, with concern for possibly placental abruption.  #PPROM: No fevers. Stable cervical exam #Pain: Epidural now, comfortable #FWB: Cat I  Continue monitoring for VB due to concern for likely marginal or placental abruption. Baby tolerating well. Total EBL per RN from the day has been 250cc. Expectant management. Second dose of BMZ to be given soon.

## 2021-01-30 NOTE — Progress Notes (Signed)
Labor Progress Note Latunya Kissick is a 25 y.o. G3P0020 at [redacted]w[redacted]d presented for PPROM.   S: Doing okay. Continues to have painful contractions. No other concerns at this time.   O:  BP 131/79   Pulse 97   Temp 97.8 F (36.6 C)   Resp 16   Ht 5\' 4"  (1.626 m)   Wt 111.1 kg   LMP 06/24/2020 (Exact Date)   SpO2 100%   BMI 42.05 kg/m   EFM: Baseline 135 bpm, moderate variability, + accels, occasional variable decels   CVE: Dilation: 4 Effacement (%): 90 Station: -2 Presentation: Vertex Exam by:: Dr. [email or web address]   A&P: 25 y.o. 30 [redacted]w[redacted]d   #PPROM/PTL: SVE now 4/90/-2. Continues to have painful contractions every 3-6 minutes. Will have patient remain on L&D for further monitoring given progression on this check. Continue magnesium and Ampicillin. S/p Azithromycin 1g and betamethasone x1.  #Pain: IV Fentanyl PRN #FWB: Cat 2 due to occasional variable decels. Reassuring variability and accels. Will continue to monitor.  #GBS negative per PCR.  #Vaginal bleeding: Stable/improved. Repeat CBC and KB stain ordered. Hgb 12.9. KB stain pending. Will continue to monitor.  12-08[birth year]1970, MD 6:31 AM

## 2021-01-30 NOTE — Progress Notes (Addendum)
Pamela Mccarty is a 25 y.o. G3P0020 at [redacted]w[redacted]d by LMP admitted for PPROM  Subjective:  Patient remained stable over night without much cervical change. Patient having occasional contractions. Having vaginal bleeding. Objective: BP 130/75   Pulse (!) 105   Temp (!) 97.4 F (36.3 C) (Oral)   Resp 18   Ht 5\' 4"  (1.626 m)   Wt 111.1 kg   LMP 06/24/2020 (Exact Date)   SpO2 100%   BMI 42.05 kg/m  No intake/output data recorded. Total I/O In: 2163.4 [P.O.:180; I.V.:1683.4; IV Piggyback:300] Out: 600 [Urine:600]  FHT:  FHR: 140 bpm, variability: moderate,  accelerations:  Present,  decelerations:  Absent UC:   irregular, every 8-10 minutes SVE:   4-5 on my check  Labs: Lab Results  Component Value Date   WBC 17.5 (H) 01/30/2021   HGB 12.9 01/30/2021   HCT 37.8 01/30/2021   MCV 87.9 01/30/2021   PLT 234 01/30/2021    Assessment / Plan: PPROM Stable cervical change.  Continue latency antibiotics Stop mag due to marginal abruption. Will see if she labors.  02/01/2021 01/30/2021, 10:14 AM

## 2021-01-30 NOTE — Consult Note (Signed)
Neonatology Consult to Antenatal Patient: 01/30/2021 10:21 AM    I was requested by Dr Despina Hidden to see this patient in order to provide antenatal counseling due to PPROM at 73 3/[redacted] week gestation.    Pamela Mccarty is a 25 y/o GP who was admitted on 10/24 and is now 31 3/[redacted] weeks GA. PPROM since 1600 on 10/24 with clear fluid. 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  She is currently having having intermittent active labor, vaginal bleeding and varibable decels.  She received a dose of BMZ and will get the next dose at 1900 tonight, on Magnesium Sulfate and IV antibiotics.  I spoke with Pamela Mccarty and her MGM in Room 217.   We discussed in detail what to expect in case of possible delivery of the infant in the next few days including morbidity and mortality at this gestational age, usual delivery room resuscitation including intubation and surfactant administration in the DR.  Discussed possible respiratory complications and need for support including mechanical ventilation, IV access, sepsis work-up, NG/OG feedings ( benefits of BF and availability of DBM), risk for IVH with the potential for motor/cognitive deficits, length of stay and long-term outcome.  They were attentive, had appropriate questions, and expressed appreciation for my input.  Thank you for asking [place] to see this patient and allowing [place] to participate in her care.  Please call if there are any further questions.   Overton Mam, MD (Attending Neonatologist)  Total length of face-to-face or floor/unit time for this encounter was 40 minutes. Counseling was greater than fifty percent of the time above.

## 2021-01-30 NOTE — Anesthesia Procedure Notes (Signed)
Epidural Patient location during procedure: OB Start time: 01/30/2021 5:12 PM End time: 01/30/2021 5:24 PM  Staffing Anesthesiologist: Trevor Iha, MD Performed: anesthesiologist   Preanesthetic Checklist Completed: patient identified, IV checked, site marked, risks and benefits discussed, surgical consent, monitors and equipment checked, pre-op evaluation and timeout performed  Epidural Patient position: sitting Prep: DuraPrep and site prepped and draped Patient monitoring: continuous pulse ox and blood pressure Approach: midline Location: L3-L4 Injection technique: LOR air  Needle:  Needle type: Tuohy  Needle gauge: 17 G Needle length: 9 cm and 9 Needle insertion depth: 7 cm Catheter type: closed end flexible Catheter size: 19 Gauge Catheter at skin depth: 12 cm Test dose: negative  Assessment Events: blood not aspirated, injection not painful, no injection resistance, no paresthesia and negative IV test  Additional Notes Patient identified. Risks/Benefits/Options discussed with patient including but not limited to bleeding, infection, nerve damage, paralysis, failed block, incomplete pain control, headache, blood pressure changes, nausea, vomiting, reactions to medication both or allergic, itching and postpartum back pain. Confirmed with bedside nurse the patient's most recent platelet count. Confirmed with patient that they are not currently taking any anticoagulation, have any bleeding history or any family history of bleeding disorders. Patient expressed understanding and wished to proceed. All questions were answered. Sterile technique was used throughout the entire procedure. Please see nursing notes for vital signs. Test dose was given through epidural needle and negative prior to continuing to dose epidural or start infusion. Warning signs of high block given to the patient including shortness of breath, tingling/numbness in hands, complete motor block, or any  concerning symptoms with instructions to call for help. Patient was given instructions on fall risk and not to get out of bed. All questions and concerns addressed with instructions to call with any issues.  1 Attempt (S) . Patient tolerated procedure well.

## 2021-01-31 DIAGNOSIS — O42919 Preterm premature rupture of membranes, unspecified as to length of time between rupture and onset of labor, unspecified trimester: Secondary | ICD-10-CM

## 2021-01-31 LAB — TYPE AND SCREEN
ABO/RH(D): B POS
Antibody Screen: NEGATIVE

## 2021-01-31 NOTE — Progress Notes (Signed)
Spoke to 1st call Salvadore Dom) about pt asking for Albuterol Inhaler.  Pt states, "I sometimes have asthma attacks and they come on suddenly.  I almost had one last night".  Asked provider for inhaler order, provider states pt has not been diagnosed with Asthma.  No order given.  Vanessa night shift RN aware.

## 2021-01-31 NOTE — Anesthesia Postprocedure Evaluation (Signed)
Anesthesia Post Note  Patient: Pamela Mccarty  Procedure(s) Performed: AN AD HOC LABOR EPIDURAL     Patient location during evaluation: Mother Baby Anesthesia Type: Epidural Level of consciousness: awake Pain management: satisfactory to patient Vital Signs Assessment: post-procedure vital signs reviewed and stable Respiratory status: spontaneous breathing Cardiovascular status: stable Anesthetic complications: no   No notable events documented.  Last Vitals:  Vitals:   01/31/21 0512 01/31/21 0756  BP: 117/65 117/65  Pulse: 81 68  Resp: 18 18  Temp: 36.8 C 36.6 C  SpO2: 100% 100%    Last Pain:  Vitals:   01/31/21 0815  TempSrc:   PainSc: 0-No pain   Pain Goal: Patients Stated Pain Goal: 3 (01/30/21 2327)                 Cephus Shelling

## 2021-01-31 NOTE — Lactation Note (Addendum)
This note was copied from a baby's chart.  NICU Lactation Consultation Note  Patient Name: Girl Aamya Orellana [id number] Date: 01/31/2021 Age:23 hours   Subjective Reason for consult: Initial assessment; NICU baby LC visited mother in NICU. She and baby were sts. Mother's RN set up pump and provided education. Mother is a [facility] participant and requests [facility] referral for pump to use at home.   LC did not assess breasts at this visit because mother and baby were comfortably sts.   Objective Infant data: Mother's Current Feeding Choice: Breast Milk   Maternal data: [id number]  Vaginal, Spontaneous Significant Breast History:: + breast changes in pregnancy  WIC Program: Yes WIC Referral Sent?: Yes   Assessment Mother has initiated pumping and is aware of need to pump frequently.   Intervention/Plan Interventions: Breast feeding basics reviewed; Education; [employer] express NICU book and LC services brochure provided.  Plan: Consult Status: Follow-up  NICU Follow-up type: New admission follow up (verify WIC pump) Mother is aware to pump q3 for 15 minutes and bring milk to NICU.  Assess breasts.  Elder Negus 01/31/2021, 3:41 PM

## 2021-01-31 NOTE — Progress Notes (Signed)
Post Partum Day 1 Subjective: no complaints  Objective: Blood pressure 117/65, pulse 68, temperature 97.8 F (36.6 C), temperature source Oral, resp. rate 18, height 5\' 4"  (1.626 m), weight 111.1 kg, last menstrual period 06/24/2020, SpO2 100 %, unknown if currently breastfeeding.  Physical Exam:  General: alert Lochia: appropriate Uterine Fundus: firm Incision: healing well DVT Evaluation: No evidence of DVT seen on physical exam.  Recent Labs    01/30/21 0522 01/30/21 2146  HGB 12.9 12.3  HCT 37.8 35.8*    Assessment/Plan: Plan for discharge tomorrow   LOS: 2 days   2147 01/31/2021, 10:58 AM

## 2021-02-01 MED ORDER — IBUPROFEN 600 MG PO TABS: 600.0000 mg | ORAL_TABLET | Freq: Four times a day (QID) | ORAL | 0 refills | Status: DC | PRN

## 2021-02-01 MED ORDER — LEVETIRACETAM 750 MG PO TABS: ORAL_TABLET | ORAL | Status: DC

## 2021-02-01 MED ORDER — ACETAMINOPHEN 500 MG PO TABS
1000.0000 mg | ORAL_TABLET | Freq: Three times a day (TID) | ORAL | 0 refills | Status: DC | PRN
Start: 2021-02-01 — End: 2021-08-09

## 2021-02-01 NOTE — Clinical Social Work Maternal (Signed)
CLINICAL SOCIAL WORK MATERNAL/CHILD NOTE  Patient Details  Name: Pamela Mccarty MRN: [id number] Date of Birth: 01/09/1996  Date:  02/01/2021  Clinical Social Worker Initiating Note:  Arnisha Meshelle Holness, LCSW Date/Time: Initiated:  02/01/21/1236     Child's Name:  Pamela Mccarty   Biological Parents:  Mother, Father (Father: Pamela Mccarty)   Need for Interpreter:  None   Reason for Referral:  Behavioral Health Concerns, Parental Support of Premature Babies < 32 weeks/or Critically Ill babies, Current Substance Use/Substance Use During Pregnancy     Address:  [address] [place] [address]    Phone number:  [phone] (home)   (dad's number; mom doesn't currently have a phone)  Additional phone number:   Household Members/Support Persons (HM/SP):   Household Member/Support Person 1, Household Member/Support Person 2, Household Member/Support Person 3, Household Member/Support Person 4   HM/SP Name Relationship DOB or Age  HM/SP -1 Pamela Mccarty FOB    HM/SP -2   FOB's mom    HM/SP -3   FOB's dad    HM/SP -4   FOB's brother    HM/SP -5        HM/SP -6        HM/SP -7        HM/SP -8          Natural Supports (not living in the home):  Immediate Family, Extended Family   Professional Supports: None   Employment: Unemployed   Type of Work:     Education:  High school graduate   Homebound arranged:    Financial Resources:  Medicaid   Other Resources:  WIC, Food Stamps     Cultural/Religious Considerations Which May Impact Care:    Strengths:      Psychotropic Medications:         Pediatrician:       Pediatrician List:   [place]    [place]    [place]    [place]    [place]    [place]      Pediatrician Fax Number:    Risk Factors/Current Problems:  Substance Use  , Mental Health Concerns     Cognitive State:  Able to Concentrate  , Alert  , Linear Thinking  , Goal Oriented     Mood/Affect:  Calm  , Interested   , Comfortable     CSW Assessment: CSW met with MOB at bedside to complete psychosocial assessment, FOB present. CSW introduced self and explained role. MOB was welcoming, open, and remained engaged during assessment. MOB reported that she resides with FOB, FOB's parents, and FOB's brother. MOB reported that she receives both WIC and food stamps. MOB reported that they have some items for infant including a crib. MOB reported that they may need assistance getting a car seat, CSW informed parents about the hospital car seat program. MOB reported that they may utilize the program prior to discharge. CSW informed parents about [facility]'s Pamela Mccarty if any assistance is needed obtaining items for infant, MOB reported that assistance with diapers and wipes would be helpful. CSW agreed to make FSN referral. CSW inquired about MOB's support system, MOB reported that FOB, her god mom, FOB's parents, FOB's brother and other family are supports.   CSW and parents discussed infant's NICU admission. CSW informed MOB about the NICU, what to expect, and resources/supports available while infant is admitted to the NICU. MOB reported that they feel well informed about infant's care and   shared concerns about infant's feeding this morning. CSW actively listened and informed MOB that she can speak with someone from NICU leadership if she would like too, MOB verbalized understanding. MOB denied any transportation barriers with visiting infant in the NICU. MOB reported that meal vouchers would be helpful, CSW agreed to place meal vouchers at infant's bedside. MOB denied any additional questions/concerns regarding the NICU.   CSW asked FOB to leave the room to speak with MOB privately, FOB left the room.   CSW inquired about MOB's mental health history. MOB reported that she was diagnosed with Depression, Anxiety, ADHD, Schizophrenia and Bipolar Disorder at age 13. MOB described her Bipolar disorder as being  fidgety, tangential speech and her focus being off. MOB was unable to recall symptoms of schizophrenia noting she blacks out. MOB reported that the last time she experienced symptoms was 3-4 years ago. MOB reported that she has a good support system to help her navigate during those times. MOB denied any current symptoms and reported that she is not taking any medication nor participating in therapy. CSW asked if MOB was interested in therapy resources, MOB declined. MOB shared that she had suicide attempts after mother passed away and she was hospitalized. CSW offered condolences for MOB's loss. CSW inquired about MOB's coping skills, MOB reported that journaling and drawing have been helpful. CSW inquired about how MOB was feeling emotionally after giving birth, MOB reported that she was feeling pretty good. MOB presented calm and did not demonstrate any acute mental health signs/symptoms. CSW assessed for safety, MOB denied SI, HI, and domestic violence.   CSW provided education regarding the baby blues period vs. perinatal mood disorders, discussed treatment and gave resources for mental health follow up if concerns arise.  CSW recommends self-evaluation during the postpartum time period using the New Mom Checklist from Postpartum Progress and encouraged MOB to contact a medical professional if symptoms are noted at any time.    CSW provided review of Sudden Infant Death Syndrome (SIDS) precautions.    CSW informed MOB about the hospital drug screen policy due to substance use during pregnancy. MOB confirmed Marijuana use and reported last use as April. MOB denied any additional substance use. CSW informed MOB that infant's UDS was negative and CDS would continue to be monitored and a CPS report would be made if warranted. MOB verbalized understanding and denied any questions.   CSW placed 8 meal vouchers at infant's bedside.   CSW will continue to offer resources/supports available while infant is  admitted to the NICU.    CSW Plan/Description:  Sudden Infant Death Syndrome (SIDS) Education, Perinatal Mood and Anxiety Disorder (PMADs) Education, Psychosocial Support and Ongoing Assessment of Needs, Other Patient/Family Education, Hospital Drug Screen Policy Information, CSW Will Continue to Monitor Umbilical Cord Tissue Drug Screen Results and Make Report if Warranted    Haleemah L Tammera Engert, LCSW 02/01/2021, 2:00 PM 

## 2021-02-02 ENCOUNTER — Inpatient Hospital Stay (HOSPITAL_COMMUNITY): Payer: Medicaid Other

## 2021-02-02 ENCOUNTER — Encounter (HOSPITAL_COMMUNITY): Payer: Self-pay | Admitting: Obstetrics & Gynecology

## 2021-02-02 ENCOUNTER — Inpatient Hospital Stay (HOSPITAL_COMMUNITY)
Admission: AD | Admit: 2021-02-02 | Discharge: 2021-02-03 | Disposition: A | Discharge status: Home / Self Care | Payer: Medicaid Other | Attending: Obstetrics & Gynecology | Admitting: Obstetrics & Gynecology

## 2021-02-02 ENCOUNTER — Other Ambulatory Visit: Payer: Self-pay

## 2021-02-02 DIAGNOSIS — R109 Unspecified abdominal pain: Secondary | ICD-10-CM

## 2021-02-02 DIAGNOSIS — O99893 Other specified diseases and conditions complicating puerperium: Secondary | ICD-10-CM | POA: Diagnosis not present

## 2021-02-02 DIAGNOSIS — R1011 Right upper quadrant pain: Secondary | ICD-10-CM | POA: Diagnosis not present

## 2021-02-02 DIAGNOSIS — R1084 Generalized abdominal pain: Secondary | ICD-10-CM

## 2021-02-02 DIAGNOSIS — Z87891 Personal history of nicotine dependence: Secondary | ICD-10-CM | POA: Insufficient documentation

## 2021-02-02 LAB — URINALYSIS, ROUTINE W REFLEX MICROSCOPIC
Bilirubin Urine: NEGATIVE
Glucose, UA: NEGATIVE mg/dL
Ketones, ur: NEGATIVE mg/dL
Nitrite: NEGATIVE
Protein, ur: NEGATIVE mg/dL
RBC / HPF: >50 RBC/hpf — ABNORMAL HIGH (ref 0–5)
Specific Gravity, Urine: 1.016 (ref 1.005–1.030)
pH: 6 (ref 5.0–8.0)

## 2021-02-02 LAB — COMPREHENSIVE METABOLIC PANEL WITH GFR
ALT: 22 U/L (ref 0–44)
AST: 19 U/L (ref 15–41)
Albumin: 2.7 g/dL — ABNORMAL LOW (ref 3.5–5.0)
Alkaline Phosphatase: 71 U/L (ref 38–126)
Anion gap: 9 (ref 5–15)
BUN: 12 mg/dL (ref 6–20)
CO2: 23 mmol/L (ref 22–32)
Calcium: 8.8 mg/dL — ABNORMAL LOW (ref 8.9–10.3)
Chloride: 106 mmol/L (ref 98–111)
Creatinine, Ser: 0.73 mg/dL (ref 0.44–1.00)
GFR, Estimated: >60 mL/min
Glucose, Bld: 82 mg/dL (ref 70–99)
Potassium: 3.6 mmol/L (ref 3.5–5.1)
Sodium: 138 mmol/L (ref 135–145)
Total Bilirubin: 0.4 mg/dL (ref 0.3–1.2)
Total Protein: 6 g/dL — ABNORMAL LOW (ref 6.5–8.1)

## 2021-02-02 LAB — CBC WITH DIFFERENTIAL/PLATELET
Abs Immature Granulocytes: 0.1 K/uL — ABNORMAL HIGH (ref 0.00–0.07)
Basophils Absolute: 0 K/uL (ref 0.0–0.1)
Basophils Relative: 0 %
Eosinophils Absolute: 0.3 K/uL (ref 0.0–0.5)
Eosinophils Relative: 2 %
HCT: 38.3 % (ref 36.0–46.0)
Hemoglobin: 13.4 g/dL (ref 12.0–15.0)
Immature Granulocytes: 1 %
Lymphocytes Relative: 26 %
Lymphs Abs: 2.9 K/uL (ref 0.7–4.0)
MCH: 30.5 pg (ref 26.0–34.0)
MCHC: 35 g/dL (ref 30.0–36.0)
MCV: 87.2 fL (ref 80.0–100.0)
Monocytes Absolute: 0.8 K/uL (ref 0.1–1.0)
Monocytes Relative: 7 %
Neutro Abs: 7.2 K/uL (ref 1.7–7.7)
Neutrophils Relative %: 64 %
Platelets: 293 K/uL (ref 150–400)
RBC: 4.39 MIL/uL (ref 3.87–5.11)
RDW: 12.1 % (ref 11.5–15.5)
WBC: 11.3 K/uL — ABNORMAL HIGH (ref 4.0–10.5)
nRBC: 0 % (ref 0.0–0.2)

## 2021-02-02 LAB — TYPE AND SCREEN
ABO/RH(D): B POS
Antibody Screen: NEGATIVE

## 2021-02-02 LAB — SURGICAL PATHOLOGY

## 2021-02-02 MED ORDER — IOHEXOL 350 MG/ML SOLN
100.0000 mL | Freq: Once | INTRAVENOUS | Status: AC | PRN
  Administered 2021-02-02: 100 mL via INTRAVENOUS

## 2021-02-02 MED ORDER — HYDROMORPHONE HCL 1 MG/ML IJ SOLN
1.0000 mg | Freq: Once | INTRAMUSCULAR | Status: AC
  Administered 2021-02-02: 1 mg via INTRAVENOUS
  Filled 2021-02-02: qty 1

## 2021-02-02 MED ORDER — ONDANSETRON HCL 4 MG/2ML IJ SOLN
4.0000 mg | Freq: Once | INTRAMUSCULAR | Status: AC
  Administered 2021-02-02: 4 mg via INTRAVENOUS
  Filled 2021-02-02: qty 2

## 2021-02-02 NOTE — MAU Provider Note (Addendum)
History     CSN: [id number]  Arrival date and time: 02/02/21 1731   Event Date/Time   First Provider Initiated Contact with Patient 02/02/21 1833      Chief Complaint  Patient presents with   Emesis   Nausea   Pamela Mccarty is a 25 year old G3P0121 who presents with a 1 day history of abdominal pain and vaginal bleeding. She underwent a vaginal delivery on 01/30/21 at 31 weeks and 3 days, and she was discharged on 02/01/21. She stated she left the hospital yesterday afternoon and was still having some vaginal bleeding. She has since had continued vaginal bleeding that she describes as malodorous, red/brown, and has required changing approximately 11 pads since yesterday afternoon. At approximately 19:00 yesterday, she began having burning abdominal pain and passed three blood clots from her vagina. Two clots were the size of a "quarter" and one clot was the size of a "small marble." She subsequently developed nausea and laid down in her bathtub and had multiple episodes of vomiting. She has since not been able to keep down any food or liquids. She describes the vomit as "light pink" in color. She states she has not had a significant bowel movement or passed any gas since her delivery. She reports feeling feverish last night and had a temperature measured at 99.8.   OB History     Gravida  3   Para  1   Term      Preterm  1   AB  2   Living  1      SAB  2   IAB      Ectopic      Multiple  0   Live Births  1           Past Medical History:  Diagnosis Date   ADHD (attention deficit hyperactivity disorder)    Anxiety    Depression    Epilepsy (HCC)    Headache(784.0)    Obesity    Seizures (HCC)    last seizure 08/29/19   Seizures (HCC)     Past Surgical History:  Procedure Laterality Date   ADENOIDECTOMY     ANKLE SURGERY     ANKLE SURGERY  at 25yo   Pt. reports to lengthen her tendons   TONSILLECTOMY      Family History  Problem Relation Age of  Onset   Bipolar disorder Mother    Cancer Mother    Diabetes Mother    Hypertension Mother    Seizures Mother    Hypertension Father    Stroke Father    Seizures Father    Seizures Brother    Heart disease Maternal Grandfather    Heart attack Maternal Grandfather     Social History   Tobacco Use   Smoking status: Former    Packs/day: 0.10    Types: Cigarettes    Quit date: 01/13/2016    Years since quitting: 5.0   Smokeless tobacco: Never  Vaping Use   Vaping Use: Never used  Substance Use Topics   Alcohol use: No   Drug use: Yes    Frequency: 7.0 times per week    Types: Marijuana    Allergies:  Allergies  Allergen Reactions   Peanut-Containing Drug Products Anaphylaxis    Stomach bubbles with Peanut oil only  (Patient states that she is not allergic to this)   Latex    Soy Allergy     hives   Tomato  Throat swelling, shortness of breath     Medications Prior to Admission  Medication Sig Dispense Refill Last Dose   acetaminophen (TYLENOL) 500 MG tablet Take 2 tablets (1,000 mg total) by mouth every 8 (eight) hours as needed (pain). 60 tablet 0 02/02/2021   ibuprofen (ADVIL) 600 MG tablet Take 1 tablet (600 mg total) by mouth every 6 (six) hours as needed (pain). 40 tablet 0 02/01/2021   Prenatal MV & Min w/FA-DHA ([facility] PRENATAL GUMMY PO) Take 150 mg by mouth 2 (two) times daily.   02/01/2021   Blood Pressure Monitor MISC For regular home bp monitoring during pregnancy (Patient not taking: No sig reported) 1 each 0    levETIRAcetam (KEPPRA) 750 MG tablet Take as directed by Neurology.       Review of Systems  Constitutional:  Positive for chills and fever.  Gastrointestinal:  Positive for abdominal pain, nausea and vomiting.  Genitourinary:  Positive for vaginal bleeding.  Physical Exam   Blood pressure 128/79, pulse 60, temperature 98.1 F (36.7 C), temperature source Oral, resp. rate 20, SpO2 100 %, unknown if currently breastfeeding.  Physical  Exam Constitutional:      General: She is awake.     Appearance: She is diaphoretic.  HENT:     Head: Normocephalic and atraumatic.  Eyes:     Comments: Pupils equal and round  Pulmonary:     Effort: Pulmonary effort is normal.  Abdominal:     General: There is no distension.     Palpations: Abdomen is soft. There is no mass.     Tenderness: There is abdominal tenderness (to palpation in the lower quadrants bilaterally). There is no guarding or rebound.  Genitourinary:    Vagina: No signs of injury. Bleeding (blood in the vaginal canal, no clots) present. No lesions.     Cervix: No lesion or cervical bleeding.  Musculoskeletal:     Cervical back: Normal range of motion.  Skin:    General: Skin is warm.  Neurological:     Mental Status: She is alert and oriented to person, place, and time.  Psychiatric:        Mood and Affect: Mood normal.        Behavior: Behavior is cooperative.   US PELVIS (TRANSABDOMINAL ONLY)  Result Date: 02/02/2021 CLINICAL DATA:  Postpartum pelvic pain.  Unknown LMP. EXAM: TRANSABDOMINAL ULTRASOUND OF PELVIS TECHNIQUE: Transabdominal ultrasound examination of the pelvis was performed including evaluation of the uterus, ovaries, adnexal regions, and pelvic cul-de-sac. COMPARISON:  11/19/2015, obstetrical sonogram 11/01/2020 FINDINGS: Uterus Measurements: 17.3 x 9.0 x 10.6 cm = volume: 866 mL. No fibroids or other mass visualized. Endometrium Thickness: 14.  No focal abnormality visualized. Right ovary Measurements: 2.7 x 1.9 x 3.0 cm = volume: 8 mL. Normal appearance/no adnexal mass. Left ovary Measurements: 3.0 x 1.6 x 2.4 cm = volume: 6 mL. Normal appearance/no adnexal mass. Other findings:  No abnormal free fluid. IMPRESSION: Normal postpartum pelvic sonogram Electronically Signed   By: Helyn Numbers M.D.   On: 02/02/2021 19:59   CT ABDOMEN PELVIS W CONTRAST  Result Date: 02/02/2021 CLINICAL DATA:  Right lower quadrant pain EXAM: CT ABDOMEN AND PELVIS  WITH CONTRAST TECHNIQUE: Multidetector CT imaging of the abdomen and pelvis was performed using the standard protocol following bolus administration of intravenous contrast. CONTRAST:  OMNIPAQUE IOHEXOL 350 MG/ML SOLN COMPARISON:  Ultrasound 02/02/2021, CT 07/11/2019 FINDINGS: Lower chest: No acute abnormality. Hepatobiliary: No focal liver abnormality is seen. No gallstones, gallbladder  wall thickening, or biliary dilatation. Pancreas: Unremarkable. No pancreatic ductal dilatation or surrounding inflammatory changes. Spleen: Normal in size without focal abnormality. Adrenals/Urinary Tract: Adrenal glands are unremarkable. Kidneys are normal, without renal calculi, focal lesion, or hydronephrosis. Bladder is unremarkable. Stomach/Bowel: Stomach is within normal limits. Appendix appears normal. No evidence of bowel wall thickening, distention, or inflammatory changes. Vascular/Lymphatic: No significant vascular findings are present. No enlarged abdominal or pelvic lymph nodes. Reproductive: Uterus is enlarged consistent with recent postpartum status. Prominent enhancing myometrial and parauterine vessels also likely due to recent pregnancy. No definite extravasation into the myometrial cavity. Small amount of hyperdense products in the endometrial canal likely represents clot or hemorrhagic material. Other: Negative for pelvic effusion or free air Musculoskeletal: No acute or significant osseous findings. IMPRESSION: 1. Negative for acute appendicitis. 2. Enlarged postpartum uterus. Mild hyperdense material in the lower uterine segment and cervix likely represents clotted blood product. There is hyperemia of the myometrium presumably due to recent pregnancy status, no definitive extravasation is seen at this time Electronically Signed   By: Jasmine Pang M.D.   On: 02/02/2021 22:56   US Abdomen Limited RUQ (LIVER/GB)  Result Date: 02/02/2021 CLINICAL DATA:  Abdominal pain and vomiting.  2 days postpartum.  EXAM: ULTRASOUND ABDOMEN LIMITED RIGHT UPPER QUADRANT COMPARISON:  Abdominopelvic CT 07/11/2019 FINDINGS: Gallbladder: Physiologically distended. No gallstones or wall thickening visualized. No sonographic Murphy sign noted by sonographer. Common bile duct: Diameter: 5 mm, normal. Liver: No focal lesion identified. Within normal limits in parenchymal echogenicity. Portal vein is patent on color Doppler imaging with normal direction of blood flow towards the liver. Other: No right upper quadrant ascites. IMPRESSION: Unremarkable right upper quadrant ultrasound. No gallstones or biliary dilatation. Electronically Signed   By: Narda Rutherford M.D.   On: 02/02/2021 20:00    MAU Course    MDM She presented with abdominal pain and vaginal bleeding starting yesterday. Upon presentation, she was hemodynamically stable, and IV access was attempted without success. Transabdominal ultrasound ordered to access for possible retained placental/amniotic fluid sac products. RUQ also performed to evaluate for potential hepatobiliary pathology. Speculum exam reveals no significant pathology.  Assessment and Plan  Aniston Christman is a 25 year old G3P0121 who presents with a 1 day history of abdominal pain and vaginal bleeding.  Runell Gess [place] 02/02/2021, 6:42 PM    CNM attestation:  I have seen and examined this patient and agree with above documentation in the med student's note.   PE: Patient Vitals for the past 24 hrs:  BP Temp Temp src Pulse Resp SpO2  02/02/21 1759 128/79 98.1 F (36.7 C) Oral 60 20 100 %   Gen: calm comfortable, NAD Resp: normal effort, no distress Heart: Regular rate Abd: Soft, diffuse overall tenderness, NEFG; normal lochia in the vagina, patient is uncomfortable with speculum exam but no clots, bimanual deferred due to patient's discomfort.   ROS, labs, PMH reviewed  Orders Placed This Encounter  Procedures   US PELVIS (TRANSABDOMINAL ONLY)   US Abdomen Limited RUQ  (LIVER/GB)   CT ABDOMEN PELVIS W CONTRAST   Comprehensive metabolic panel   CBC with Differential/Platelet   Type and screen [facility]   Insert peripheral IV   Meds ordered this encounter  Medications   ondansetron (ZOFRAN) injection 4 mg   -Patient care endorsed to Camelia Eng with CT scan pending -CBC shows slightly elevated white count, Marylene Land, CNM 02/02/2021 8:32 PM   Postpartum exam Abdominal pain   -  CT results unremarkable, no appendicitis. On reassessment, patient reports feeling better and is requesting discharge so that she can go to NICU to see her baby. Patient continues to have some mild abdominal tenderness on exam, but VSS, patient remains afebrile. Reviewed patient with Dr. Charlotta Newton who agrees patient is stable for discharge home.  - Patient given strict return precautions - Patient to keep appointment as scheduled    Brand Males, CNM 02/03/21 1:00 AM

## 2021-02-02 NOTE — MAU Note (Addendum)
Presents stating she's having burning in lower abdomen, states feels like someone is pouring lighter fluid down stomach.  States passing large clots and changing sanitary napkin every 2 hours.  Also reports having N/V, unable to keep anything down since 2000 last night. Reports had fever last night. Temp 100.4.  S/P vaginal delivery on 01/30/2021

## 2021-02-06 ENCOUNTER — Encounter: Payer: Medicaid Other | Admitting: Women's Health

## 2021-02-09 ENCOUNTER — Other Ambulatory Visit: Payer: Self-pay

## 2021-02-09 ENCOUNTER — Encounter (HOSPITAL_COMMUNITY): Payer: Self-pay | Admitting: Obstetrics & Gynecology

## 2021-02-09 ENCOUNTER — Ambulatory Visit: Payer: Self-pay

## 2021-02-09 ENCOUNTER — Inpatient Hospital Stay (HOSPITAL_COMMUNITY)
Admission: AD | Admit: 2021-02-09 | Discharge: 2021-02-09 | Disposition: A | Discharge status: Home / Self Care | Payer: Medicaid Other | Attending: Obstetrics & Gynecology | Admitting: Obstetrics & Gynecology

## 2021-02-09 DIAGNOSIS — R1030 Lower abdominal pain, unspecified: Secondary | ICD-10-CM | POA: Insufficient documentation

## 2021-02-09 DIAGNOSIS — O99893 Other specified diseases and conditions complicating puerperium: Secondary | ICD-10-CM

## 2021-02-09 DIAGNOSIS — Z791 Long term (current) use of non-steroidal anti-inflammatories (NSAID): Secondary | ICD-10-CM | POA: Insufficient documentation

## 2021-02-09 DIAGNOSIS — R519 Headache, unspecified: Secondary | ICD-10-CM

## 2021-02-09 DIAGNOSIS — R42 Dizziness and giddiness: Secondary | ICD-10-CM | POA: Diagnosis not present

## 2021-02-09 DIAGNOSIS — O9089 Other complications of the puerperium, not elsewhere classified: Secondary | ICD-10-CM | POA: Diagnosis not present

## 2021-02-09 NOTE — MAU Note (Signed)
Patient had a vaginal delivery on 01-30-21 at [redacted]w[redacted]d  after going into preterm labor. Pt states she has had a HA and dizziness since the day after delivery on and off.  She was with the lactation consultant today and she recommended the patient have her BP checked since she has had a HA and has felt dizzy.  Pt states she is not sure if it's because she's been pumping too much?  She just wants to be checked out.

## 2021-02-09 NOTE — MAU Provider Note (Signed)
History     CSN: [id number]  Arrival date and time: 02/09/21 1338   Event Date/Time   First Provider Initiated Contact with Patient 02/09/21 1417      Chief Complaint  Patient presents with   Headache   Dizziness   HPI Pamela Mccarty 25 y.o. postpartum from birth on 01-29-21, Baby is ion NICU and patient has been staying in NICU almost continuously for past 9 days.  Sleeps in baby's room and eats in the cafeteria.  Had a headache today when lactation was seeing her and she was pumping.  Reports having a headache, feeling bad, and lower abdominal cramping when she is pumping.  States headache had been 8/10 but has now resolved when here in MAU.  Talking quite a bit. Report her labor and birth was good.  She has been holding the baby lots.  Has been taking ibuprofen as prescribed and last dose was at 11:30 am when she had her first meal of the day.    OB History     Gravida  3   Para  1   Term      Preterm  1   AB  2   Living  1      SAB  2   IAB      Ectopic      Multiple  0   Live Births  1           Past Medical History:  Diagnosis Date   ADHD (attention deficit hyperactivity disorder)    Anxiety    Depression    Epilepsy (HCC)    Headache(784.0)    Obesity    Seizures (HCC)    last seizure 08/29/19   Seizures (HCC)     Past Surgical History:  Procedure Laterality Date   ADENOIDECTOMY     ANKLE SURGERY     ANKLE SURGERY  at 25yo   Pt. reports to lengthen her tendons   TONSILLECTOMY      Family History  Problem Relation Age of Onset   Bipolar disorder Mother    Cancer Mother    Diabetes Mother    Hypertension Mother    Seizures Mother    Hypertension Father    Stroke Father    Seizures Father    Seizures Brother    Heart disease Maternal Grandfather    Heart attack Maternal Grandfather     Social History   Tobacco Use   Smoking status: Former    Packs/day: 0.10    Types: Cigarettes    Quit date: 01/13/2016    Years since  quitting: 5.0   Smokeless tobacco: Never  Vaping Use   Vaping Use: Never used  Substance Use Topics   Alcohol use: No   Drug use: Not Currently    Frequency: 7.0 times per week    Types: Marijuana    Comment: none since pregnancy    Allergies:  Allergies  Allergen Reactions   Peanut-Containing Drug Products Anaphylaxis    Stomach bubbles with Peanut oil only  (Patient states that she is not allergic to this)   Latex    Soy Allergy     hives   Tomato     Throat swelling, shortness of breath     Medications Prior to Admission  Medication Sig Dispense Refill Last Dose   ibuprofen (ADVIL) 600 MG tablet Take 1 tablet (600 mg total) by mouth every 6 (six) hours as needed (pain). 40 tablet 0 02/08/2021 at 1130  Prenatal MV & Min w/FA-DHA ([facility] PRENATAL GUMMY PO) Take 150 mg by mouth 2 (two) times daily.   02/09/2021   acetaminophen (TYLENOL) 500 MG tablet Take 2 tablets (1,000 mg total) by mouth every 8 (eight) hours as needed (pain). 60 tablet 0 More than a month   Blood Pressure Monitor MISC For regular home bp monitoring during pregnancy (Patient not taking: No sig reported) 1 each 0    levETIRAcetam (KEPPRA) 750 MG tablet Take as directed by Neurology.       Review of Systems  Constitutional:  Negative for fever.  Respiratory:  Negative for cough, shortness of breath and wheezing.   Gastrointestinal:  Positive for abdominal pain. Negative for vomiting.  Genitourinary:  Negative for dysuria and vaginal discharge.  Neurological:  Positive for headaches. Negative for facial asymmetry and weakness.  Physical Exam   Blood pressure 138/72, pulse 78, temperature 98.2 F (36.8 C), temperature source Oral, resp. rate 18, weight 95.1 kg, SpO2 99 %, currently breastfeeding.  Physical Exam Vitals and nursing note reviewed.  Constitutional:      Appearance: She is well-developed.  HENT:     Head: Normocephalic.  Cardiovascular:     Rate and Rhythm: Regular rhythm.  Pulmonary:      Effort: Pulmonary effort is normal.  Abdominal:     Palpations: Abdomen is soft.     Tenderness: There is no abdominal tenderness. There is no guarding or rebound.     Comments: No RUQ pain  Musculoskeletal:        General: No swelling. Normal range of motion.     Cervical back: Neck supple.     Right lower leg: No edema.     Left lower leg: No edema.  Skin:    General: Skin is warm and dry.  Neurological:     Mental Status: She is alert and oriented to person, place, and time.     Cranial Nerves: No cranial nerve deficit.  Psychiatric:        Mood and Affect: Mood normal.        Behavior: Behavior normal.        Thought Content: Thought content normal.    MAU Course  Procedures  MDM BP is normal.  Had voiced several complaints to lactation consultant.  But now in MAU, her headache has gone - likely headache triggered from many hours between meals.  And then she took ibuprofen and probably helped her headache resolve.  Reviewed eating small amounts of food more frequently.  She has been awakened every 3 hours for the past 9 days as she is staying almost continuously with the baby.  Reports her baby looks like her deceased mother and patient is feeling a connection with her mother int he baby.  States, "she (my baby) is my best friend".  Advised taking some time away to go home shower, rest, eat, keep hydrated.  Has an electric pump at home and knows to continue pumping every 3 hours.    Has a history of depression and anxiety.  Would likely benefit from some therapy in preparation for taking her baby home.  No evidence of depression today.  Is very talkative about her baby.  Assessment and Plan  Headache has resolved spontaneously from ibuprofen taken at 11:30 am. 9 days postpartum, normal BP  Plan Advised going home to rest which might keep a headache from being triggered again. Advised eating every 3-4 hours - small amounts Drink at least 8 8-oz glasses  of water every day -  states she is keeping hydrated. Expect some lower abdominal pain with pumping as the uterus is going back to normal size.   Tarvis Blossom L Delshon Blanchfield 02/09/2021, 2:48 PM

## 2021-02-09 NOTE — Lactation Note (Signed)
This note was copied from a baby's chart.  NICU Lactation Consultation Note  Patient Name: Girl Chelisa Hennen [id number] Date: 02/09/2021 Age:23 days   Subjective Reason for consult: Follow-up assessment; Mother's request; Other (Comment) (low milk supply at day 10) Mother complains of headache, nausea, and dizziness while pumping. She pumps q3 for 20 minutes with low volume.   Mother presented at MAU earlier this week with increased bleeding, clots, and N&V.   Objective Infant data: Mother's Current Feeding Choice: Breast Milk  Infant feeding assessment Scale for Readiness: 3    Maternal data: [id number]  Vaginal, Spontaneous Current breast feeding challenges:: low milk supply at day 10  Pumping frequency: q3 Pumped volume: 20 mL   Assessment Maternal: Milk volume: Low Low milk volume may be related to symptoms and hx reported above.  Intervention/Plan Interventions: Education (referred to MAU for bp check)  Tools: Pump  Plan: Consult Status: Follow-up  NICU Follow-up type: Weekly NICU follow up; Verify onset of copious milk LC encouraged mother to f/u with her provider or at MAU for evaluation. LC encouraged mother to continue pumping q3. Will plan f/u to further assess.    Elder Negus 02/09/2021, 1:26 PM

## 2021-02-09 NOTE — Discharge Instructions (Signed)
Keep your appointments in the office. No intercourse       BRAINSTORMING  Develop a Plan Goals: Provide a way to start conversation about your new life with a baby Assist parents in recognizing and using resources within their reach Help pave the way before birth for an easier period of transition afterwards.  Make a list of the following information to keep in a central location: Full name of Mom and Partner: _____________________________________________ 22 full name and Date of Birth: ___________________________________________ Home Address: ___________________________________________________________ ________________________________________________________________________ Home Phone: ____________________________________________________________ Parents' cell numbers: _____________________________________________________ ________________________________________________________________________ Name and contact info for OB: ______________________________________________ Name and contact info for Pediatrician:________________________________________ Contact info for Lactation Consultants: ________________________________________  REST and SLEEP *You each need at least 4-5 hours of uninterrupted sleep every day. Write specific names and contact information.* How are you going to rest in the postpartum period? While partner's home? When partner returns to work? When you both return to work? Where will your baby sleep? Who is available to help during the day? Evening? Night? Who could move in for a period to help support you? What are some ideas to help you get enough sleep? __________________________________________________________________________________________________________________________________________________________________________________________________________________________________________ NUTRITIOUS FOOD AND DRINK *Plan for meals before your baby is born so you can  have healthy food to eat during the immediate postpartum period.* Who will look after breakfast? Lunch? Dinner? List names and contact information. Brainstorm quick, healthy ideas for each meal. What can you do before baby is born to prepare meals for the postpartum period? How can others help you with meals? Which grocery stores provide online shopping and delivery? Which restaurants offer take-out or delivery options? ______________________________________________________________________________________________________________________________________________________________________________________________________________________________________________________________________________________________________________________________________________________________________________________________________  CARE FOR MOM *It's important that mom is cared for and pampered in the postpartum period. Remember, the most important ways new mothers need care are: sleep, nutrition, gentle exercise, and time off.* Who can come take care of mom during this period? Make a list of people with their contact information. List some activities that make you feel cared for, rested, and energized? Who can make sure you have opportunities to do these things? Does mom have a space of her very own within your home that's just for her? Make a "[facility]" where she can be comfortable, rest, and renew herself daily. ______________________________________________________________________________________________________________________________________________________________________________________________________________________________________________________________________________________________________________________________________________________________________________________________________    CARE FOR AND FEEDING BABY *Knowledgeable and encouraging people will offer the best support with regard to feeding your  baby.* Educate yourself and choose the best feeding option for your baby. Make a list of people who will guide, support, and be a resource for you as your care for and feed your baby. (Friends that have breastfed or are currently breastfeeding, lactation consultants, breastfeeding support groups, etc.) Consider a postpartum doula. (These websites can give you information: dona.org & BuyingShow.es) Seek out local breastfeeding resources like the breastfeeding support group at [employer] or [employer]. ______________________________________________________________________________________________________________________________________________________________________________________________________________________________________________________________________________________________________________________________________________________________________________________________________  Verner Chol AND ERRANDS Who can help with a thorough cleaning before baby is born? Make a list of people who will help with housekeeping and chores, like laundry, light cleaning, dishes, bathrooms, etc. Who can run some errands for you? What can you do to make sure you are stocked with basic supplies before baby is born? Who is going to do the shopping? ______________________________________________________________________________________________________________________________________________________________________________________________________________________________________________________________________________________________________________________________________________________________________________________________________     Family Adjustment *Nurture yourselves.it helps parents be more loving and allows for better bonding with their child.* What sorts of things do you and partner enjoy doing together? Which activities help you to connect and strengthen your relationship? Make  a list of those things. Make  a list of people whom you trust to care for your baby so you can have some time together as a couple. What types of things help partner feel connected to Mom? Make a list. What needs will partner have in order to bond with baby? Other children? Who will care for them when you go into labor and while you are in the hospital? Think about what the needs of your older children might be. Who can help you meet those needs? In what ways are you helping them prepare for bringing baby home? List some specific strategies you have for family adjustment. _______________________________________________________________________________________________________________________________________________________________________________________________________________________________________________________________________________________________________________________________________________  SUPPORT *Someone who can empathize with experiences normalizes your problems and makes them more bearable.* Make a list of other friends, neighbors, and/or co-workers you know with infants (and small children, if applicable) with whom you can connect. Make a list of local or online support groups, mom groups, etc. in which you can be involved. ______________________________________________________________________________________________________________________________________________________________________________________________________________________________________________________________________________________________________________________________________________________________________________________________________  Childcare Plans Investigate and plan for childcare if mom is returning to work. Talk about mom's concerns about her transition back to work. Talk about partner's concerns regarding this transition.  Mental Health *Your mental health is one of the highest priorities for a pregnant or postpartum mom.* 1 in  5 women experience anxiety and/or depression from the time of conception through the first year after birth. Postpartum Mood Disorders are the #1 complication of pregnancy and childbirth and the suffering experienced by these mothers is not necessary! These illnesses are temporary and respond well to treatment, which often includes self-care, social support, talk therapy, and medication when needed. Women experiencing anxiety and depression often say things like: "I'm supposed to be happy.why do I feel so sad?", "Why can't I snap out of it?", "I'm having thoughts that scare me." There is no need to be embarrassed if you are feeling these symptoms: Overwhelmed, anxious, angry, sad, guilty, irritable, hopeless, exhausted but can't sleep You are NOT alone. You are NOT to blame. With help, you WILL be well. Where can I find help? Medical professionals such as your OB, midwife, gynecologist, family practitioner, primary care provider, pediatrician, or mental health providers; [facility] support groups: Feelings After Birth, Breastfeeding Support Group, Baby and Me Group, and Fit 4 Two exercise classes. You have permission to ask for help. It will confirm your feelings, validate your experiences, share/learn coping strategies, and gain support and encouragement as you heal. You are important! BRAINSTORM Make a list of local resources, including resources for mom and for partner. Identify support groups. Identify people to call late at night - include names and contact info. Talk with partner about perinatal mood and anxiety disorders. Talk with your OB, midwife, and doula about baby blues and about perinatal mood and anxiety disorders. Talk with your pediatrician about perinatal mood and anxiety disorders.   Support & Sanity Savers   What do you really need?  Basics In preparing for a new baby, many expectant parents spend hours shopping for baby clothes, decorating the nursery, and deciding  which car seat to buy. Yet most don't think much about what the reality of parenting a newborn will be like, and what they need to make it through that. So, here is the advice of experienced parents. We know you'll read this, and think "they're exaggerating, I don't really need that." Just trust [place] on these, OK? Plan for all of this, and if it turns out you don't need it, come back  and teach [place] how you did it!  Must-Haves (Once baby's survival needs are met, make sure you attend to your own survival needs!) Sleep An average newborn sleeps 16-18 hours per day, over 6-7 sleep periods, rarely more than three hours at a time. It is normal and healthy for a newborn to wake throughout the night... but really hard on parents!! Naps. Prioritize sleep above any responsibilities like: cleaning house, visiting friends, running errands, etc.  Sleep whenever baby sleeps. If you can't nap, at least have restful times when baby eats. The more rest you get, the more patient you will be, the more emotionally stable, and better at solving problems.  Food You may not have realized it would be difficult to eat when you have a newborn. Yet, when we talk to countless new parents, they say things like "it may be 2:00 pm when I realize I haven't had breakfast yet." Or "every time we sit down to dinner, baby needs to eat, and my food gets cold, so I don't bother to eat it." Finger food. Before your baby is born, stock up with one months' worth of food that: 1) you can eat with one hand while holding a baby, 2) doesn't need to be prepped, 3) is good hot or cold, 4) doesn't spoil when left out for a few hours, and 5) you like to eat. Think about: nuts, dried fruit, Clif bars, pretzels, jerky, gogurt, baby carrots, apples, bananas, crackers, cheez-n-crackers, string cheese, hot pockets or frozen burritos to microwave, garden burgers and breakfast pastries to put in the toaster, yogurt drinks, etc. Restaurant Menus. Make lists of  your favorite restaurants & menu items. When family/friends want to help, you can give specific information without much thought. They can either bring you the food or send gift cards for just the right meals. Freezer Meals.  Take some time to make a few meals to put in the freezer ahead of time.  Easy to freeze meals can be anything such as soup, lasagna, chicken pie, or spaghetti sauce. Set up a Meal Schedule.  Ask friends and family to sign up to bring you meals during the first few weeks of being home. (It can be passed around at baby showers!) You have no idea how helpful this will be until you are in the throes of parenting.  https://hamilton-woodard.com/ is a great website to check out. Emotional Support Know who to call when you're stressed out. Parenting a newborn is very challenging work. There are times when it totally overwhelms your normal coping abilities. EVERY NEW PARENT NEEDS TO HAVE A PLAN FOR WHO TO CALL WHEN THEY JUST CAN'T COPE ANY MORE. (And it has to be someone other than the baby's other parent!) Before your baby is born, come up with at least one person you can call for support - write their phone number down and post it on the refrigerator. Anxiety & Sadness. Baby blues are normal after pregnancy; however, there are more severe types of anxiety & sadness which can occur and should not be ignored.  They are always treatable, but you have to take the first step by reaching out for help. [facility] offers a "Mom Talk" group which meets every Tuesday from 10 am - 11 am.  This group is for new moms who need support and connection after their babies are born.  Call [phone].  Really, Really Helpful (Plan for them! Make sure these happen often!!) Physical Support with Taking Care of Yourselves Asking  friends and family. Before your baby is born, set up a schedule of people who can come and visit and help out (or ask a friend to schedule for you). Any time someone says "let me know what I  can do to help," sign them up for a day. When they get there, their job is not to take care of the baby (that's your job and your joy). Their job is to take care of you!  Postpartum doulas. If you don't have anyone you can call on for support, look into postpartum doulas:  professionals at helping parents with caring for baby, caring for themselves, getting breastfeeding started, and helping with household tasks. www.padanc.org is a helpful website for learning about doulas in our area. Peer Support / Parent Groups Why: One of the greatest ideas for new parents is to be around other new parents. Parent groups give you a chance to share and listen to others who are going through the same season of life, get a sense of what is normal infant development by watching several babies learn and grow, share your stories of triumph and struggles with empathetic ears, and forgive your own mistakes when you realize all parents are learning by trial and error. Where to find: There are many places you can meet other new parents throughout our community.  [facility] offers the following classes for new moms and their little ones:  Baby and Me (Birth to [place]) and Breastfeeding Support Group. Go to www.conehealthybaby.com or call [phone] for more information. Time for your Relationship It's easy to get so caught up in meeting baby's immediate needs that it's hard to find time to connect with your partner, and meet the needs of your relationship. It's also easy to forget what "quality time with your partner" actually looks like. If you take your baby on a date, you'd be amazed how much of your couple time is spent feeding the baby, diapering the baby, admiring the baby, and talking about the baby. Dating: Try to take time for just the two of you. Babysitter tip: Sometimes when moms are breastfeeding a newborn, they find it hard to figure out how to schedule outings around baby's unpredictable feeding schedules.  Have the babysitter come for a three hour period. When she comes over, if baby has just eaten, you can leave right away, and come back in two hours. If baby hasn't fed recently, you start the date at home. Once baby gets hungry and gets a good feeding in, you can head out for the rest of your date time. Date Nights at Home: If you can't get out, at least set aside one evening a week to prioritize your relationship: whenever baby dozes off or doesn't have any immediate needs, spend a little time focusing on each other. Potential conflicts: The main relationship conflicts that come up for new parents are: issues related to sexuality, financial stresses, a feeling of an unfair division of household tasks, and conflicts in parenting styles. The more you can work on these issues before baby arrives, the better!  Fun and Frills (Don't forget these. and don't feel guilty for indulging in them!) Everyone has something in life that is a fun little treat that they do just for themselves. It may be: reading the morning paper, or going for a daily jog, or having coffee with a friend once a week, or going to a movie on Friday nights, or fine chocolates, or bubble baths, or curling up with a good  book. Unless you do fun things for yourself every now and then, it's hard to have the energy for fun with your baby. Whatever your "special" treats are, make sure you find a way to continue to indulge in them after your baby is born. These special moments can recharge you, and allow you to return to baby with a new joy   PERINATAL MOOD DISORDERS: [place]   _________________________________________Emergency and Crisis Resources If you are an imminent risk to self or others, are experiencing intense personal distress, and/or have noticed significant changes in activities of daily living, call:  [place]: [phone]  [address], [place], [place], [address] Mobile Crisis: [place]: 988 Or visit the following crisis centers: Local Emergency Departments [facility]: [address] [place]. Hours: 8:30AM-5PM. Insurance Accepted: Medicaid, Medicare, and Uninsured.  [facility]:  [address] [place]  Mon-Friday 8am-3pm, [phone]                                                                                  ___________ Non-Crisis Resources To identify specific providers that are covered by your insurance, contact your insurance company or local agencies:  [place]: [phone] [facility] and [employer]: [place]: [phone] Postpartum Support International- Warm-line: 6142767227                                                      __Outpatient Therapy and Medication Management   Providers:  [facility]: [phone] Hours: 9AM-5PM  Insurance Accepted: Alben Spittle, Shane Crutch, [place], [place] [facility]): [phone] Hours: 8AM-5:30PM  nsurance Accepted: All insurances EXCEPT AARP, [place], [place], and [place]: [phone] Hours: 8AM-8PM Insurance Accepted: Cristal Ford, Freddrick March, [place], Medicare, [facility] [phone] [facility]: [phone] Hours: 8:30AM-7PM Insurance Accepted: Cristal Ford, Medicaid, Medicare, Tricare, [employer] Counseling:  [place] Accepted:  Holland Falling, Lorella Nimrod, [employer], [place]: [phone] Hours: 9AM-5:30PM Insurance Accepted: Alben Spittle, Charlotte Crumb, and Medicaid, Medicare, [facility]:  [phone] Hours: 9am-5pm Insurance Accepted: BCBS; they do  not accept Medicaid/Medicare [facility] [place]: [phone] Hours: 9am-9pm Insurance Accepted: All major insurance including Medicaid and Medicare [facility]: [phone] Hours: [place] Accepted: All insurances EXCEPT Medicaid and Medicare. [facility]: [phone]   ____________                                                                     Parenting [place]: [phone] [facility]:  336- [place]: (support for children in the NICU and/or with special needs), [phone]   ___________                                                                 Mental Health Support Groups Mental Health Association: [phone]    _____________                                                                                  Online Resources Postpartum Support International: http://jones-berg.com/  800-944-4PPD 2Moms Supporting Moms:  www.momssupportingmoms.net

## 2021-02-10 ENCOUNTER — Telehealth (HOSPITAL_COMMUNITY): Payer: Self-pay

## 2021-02-10 ENCOUNTER — Ambulatory Visit: Payer: Self-pay

## 2021-02-10 NOTE — Telephone Encounter (Signed)
Patient returning discharge follow up call.   "I'm about to come to the hospital now to bring more milk for the baby. I'm still bleeding and I was alittle concerned because i'm still bleeding." RN reviewed normal lochia amounts, color, and duration of bleeding. RN also reviewed what to much bleeding looks like and what to report to the provider. "I'm having this burning pain in my lower stomach sometimes." RN reviewed postpartum cramping and involution process. Patient declines any foul vaginal odor, drianage, or fever. "What foods should I be eating?" RN enouraged patient to try to eat a well balanced healthy diet and to drink plenty of fluids. Patient has no other questions or concerns about her healing.  Baby is in the NICU. "She is doing well. She is gaining weight and eating a lot. I'm about to go up there to spend the weekend with her."  EPDS score is 0.   Marcelino Duster [facility] 02/10/2021,1246

## 2021-02-10 NOTE — Lactation Note (Signed)
This note was copied from a baby's chart. Lactation Consultation Note  Patient Name: Pamela Mccarty ULAGT'X Date: 02/10/2021 Reason for consult: Follow-up assessment;NICU baby;Infant < 6lbs;Preterm <34wks Age:23 days  Visited with mom at 11 days post partum; her supply continues to decrease, mom told LC that she's completely exhausted because she's not sleeping at night and that her provider advised her to start sleeping through the night.   Explained to mom that this may hurt her supply even more but that also her mental health should be her priority. Reviewed power pumping in the AM to try to offset the missing pumping session at night, but even during the day, she hasn't been pumping consistently.  Maternal Data  Mom's supply continues to decreased probably due to infrequent pumping along other risk factors and is [employer] Mother's Current Feeding Choice: Breast Milk and Formula  Lactation Tools Discussed/Used Tools: Pump Breast pump type: Double-Electric Breast Pump Pump Education: Setup, frequency, and cleaning;Milk Storage Reason for Pumping: pre-term in NICU Pumping frequency: 4 times/24 hours Pumped volume: 5 mL  Plan of care:  Encouraged mom to try to pump every 3 hours and power pump in the AM She also asked about galactagogues, she may try oats and increased her hydration level   FOB present and supportive. All questions and concerns answered, parents to call NICU LC PRN.   Consult Status Consult Status: Follow-up Date: 02/10/21 Follow-up type: In-patient  Leontyne Manville Venetia Constable 02/10/2021, 7:30 PM

## 2021-02-10 NOTE — Telephone Encounter (Signed)
  No answer. Left message to return nurse call.  Marcelino Duster [facility] 02/10/2021,1130

## 2021-02-24 ENCOUNTER — Ambulatory Visit: Payer: Self-pay

## 2021-02-24 NOTE — Lactation Note (Deleted)
This note was copied from a baby's chart. Lactation Consultation Note LC to room to assist with 1100 bf'ing. Baby did not wake and RN was present to begin gavage. We reviewed normalcy. I will plan to return for the 5pm feeding to further assist.   Patient Name: Girl Darcia Lampi FBPZW'C Date: 02/24/2021 Reason for consult: Weekly NICU follow-up Age:23 wk.o.  Feeding Mother's Current Feeding Choice: Breast Milk and Formula  Lactation Tools Discussed/Used Pumping frequency: 6-7 x day Pumped volume: 30 mL  Interventions Interventions: Education;Visual merchandiser education  Consult Status Consult Status: Follow-up  Elder Negus 02/24/2021, 11:01 AM

## 2021-02-24 NOTE — Lactation Note (Signed)
This note was copied from a baby's chart.  NICU Lactation Consultation Note  Patient Name: Pamela Mccarty ELFYB'O Date: 02/24/2021 Age:23 wk.o.   Subjective Reason for consult: Weekly NICU follow-up Mother endorses pumping routinely. She notices that infant demonstrates cues during sts. Mom requests latch assistance. Appointment scheduled.  Objective Infant data: Mother's Current Feeding Choice: Breast Milk and Formula  Infant feeding assessment Scale for Readiness: 2    Maternal data: [id number]  Vaginal, Spontaneous  Pumping frequency: 6-7 x day Pumped volume: 30 mL   Assessment Maternal: Milk volume: Low Milk volume may trend upward when baby begins bf'ing.  Intervention/Plan Interventions: Education; Infant Driven Feeding Algorithm education   Plan: Consult Status: Follow-up  NICU Follow-up type: Assist with IDF-2 (Mother does not need to pre-pump before breastfeeding) (11-20 @ 1100 feeding)    Elder Negus 02/24/2021, 10:45 AM

## 2021-02-25 ENCOUNTER — Ambulatory Visit: Payer: Self-pay

## 2021-02-25 NOTE — Lactation Note (Signed)
This note was copied from a baby's chart. Lactation Consultation Note  Patient Name: Pamela Mccarty WNUUV'O Date: 02/25/2021 Reason for consult: Follow-up assessment;NICU baby;Late-preterm 34-36.6wks Age:23 wk.o.  Visited with mom of 15 75/70 weeks old LPI NICU female, she requested a feeding assist for 11 am. FOB was having a meal when entered the room, educated parents about NICU policies and infection prevention and why they can't have meals/snacks while in the room.  When LC started to assist mom with this feeding, FOB opened a bag of chips and started to eat again on the background (this is a couplet care room). Asked NICU RN Theodoro Grist if he has educated parents on our policies and he reinforced the education again and asked FOB not to eat in the room. NICU [facility] RN Terri notified.  Baby willing to go to breast and showing feeding cues but she was uncoordinated and required constant repositioning. Due to mom's supply and baby's age, this mom does not need to pump prior feedings if she continues pumping at the frequency she is. No audible swallows noted during this feeding, baby kept slipping off/pushing off the breast, asked NICU RN Theodoro Grist to do a full gavage on baby.  Maternal Data  Mom's supply continues to dwindle probably due to inconsistent pumping and is BNNL  Feeding Mother's Current Feeding Choice: Breast Milk and Formula  LATCH Score Latch: Repeated attempts needed to sustain latch, nipple held in mouth throughout feeding, stimulation needed to elicit sucking reflex.  Audible Swallowing: None  Type of Nipple: Everted at rest and after stimulation  Comfort (Breast/Nipple): Soft / non-tender  Hold (Positioning): Assistance needed to correctly position infant at breast and maintain latch.  LATCH Score: 6  Lactation Tools Discussed/Used Tools: Pump Breast pump type: Double-Electric Breast Pump Pump Education: Setup, frequency, and cleaning;Milk Storage Reason for Pumping:  LPI in NICU Pumping frequency: 5 times/24 hours Pumped volume: 15 mL  Interventions Interventions: Breast feeding basics reviewed;DEBP;Education  Plan of care   Encouraged mom to try to pump every 3 hours and power pump in the AM; she might choose to do it at her own pace though She'll continue putting baby to breast on feeding cues, mom voiced she feels comfortable on doing it on her own, but will call for assistance when needed   FOB present. All questions and concerns answered, parents to call NICU LC PRN.   Discharge Pump: DEBP  Consult Status Consult Status: Follow-up Date: 02/25/21 Follow-up type: In-patient   Shaina Gullatt Venetia Constable 02/25/2021, 11:26 AM

## 2021-02-28 ENCOUNTER — Ambulatory Visit: Payer: Self-pay

## 2021-02-28 NOTE — Lactation Note (Signed)
This note was copied from a baby's chart.  NICU Lactation Consultation Note  Patient Name: Girl Latrece Nitta WGNFA'O Date: 02/28/2021 Age:23 wk.o.   Subjective Reason for consult: Follow-up assessment; Mother's request Mother reports continued low milk supply. She is pumping frequently. During our conversation, mother endorses increased level of anxiety and inability to sleep. Mother consents for referral to social worker.  We reviewed mother's breast history and markers for low milk supply. LC reviewed with mother that increase in supply at this time is unlikely. LC assured mother that she did nothing to cause her low milk supply.   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 It is unlikely she will notice an increase in supply at this time.   Intervention/Plan Interventions: Education (referral to Child psychotherapist)  Plan: Consult Status: Follow-up  NICU Follow-up type: Weekly NICU follow up  [facility] will request referral to social worker.   Elder Negus 02/28/2021, 2:33 PM

## 2021-03-06 ENCOUNTER — Ambulatory Visit: Payer: Self-pay

## 2021-03-06 NOTE — Lactation Note (Signed)
This note was copied from a baby's chart.  NICU Lactation Consultation Note  Patient Name: Pamela Mccarty HGDJM'E Date: 03/06/2021 Age:23 wk.o.   Subjective Reason for consult: Weekly NICU follow-up Mother reports she is getting only drops when pumping. She has only pumped 1 x today. She pumped 7x yesterday.  Objective Infant data: Mother's Current Feeding Choice: Breast Milk  Infant feeding assessment Scale for Readiness: 1 Scale for Quality: 5 (significant stridor; minimal SSB coordination)    Maternal data: [id number]  Vaginal, Spontaneous   WIC Program: Yes WIC Referral Sent?: Yes Pump: DEBP  Assessment Maternal: Mother has low milk supply. It is unlikely that her supply will increase at this time.   Intervention/Plan Plan: Consult Status: Follow-up  NICU Follow-up type: Weekly NICU follow up    Elder Negus 03/06/2021, 3:07 PM

## 2021-03-07 ENCOUNTER — Ambulatory Visit: Payer: Medicaid Other | Admitting: Advanced Practice Midwife

## 2021-03-08 ENCOUNTER — Inpatient Hospital Stay (HOSPITAL_COMMUNITY): Payer: Medicaid Other

## 2021-03-08 ENCOUNTER — Encounter (HOSPITAL_COMMUNITY): Payer: Self-pay | Admitting: Obstetrics and Gynecology

## 2021-03-08 ENCOUNTER — Inpatient Hospital Stay (HOSPITAL_COMMUNITY)
Admission: AD | Admit: 2021-03-08 | Discharge: 2021-03-08 | Disposition: A | Discharge status: Home / Self Care | Payer: Medicaid Other | Attending: Obstetrics and Gynecology | Admitting: Obstetrics and Gynecology

## 2021-03-08 ENCOUNTER — Other Ambulatory Visit: Payer: Self-pay

## 2021-03-08 DIAGNOSIS — R531 Weakness: Secondary | ICD-10-CM | POA: Insufficient documentation

## 2021-03-08 DIAGNOSIS — R42 Dizziness and giddiness: Secondary | ICD-10-CM | POA: Diagnosis not present

## 2021-03-08 LAB — CBC
HCT: 43.7 % (ref 36.0–46.0)
Hemoglobin: 14.6 g/dL (ref 12.0–15.0)
MCH: 29.9 pg (ref 26.0–34.0)
MCHC: 33.4 g/dL (ref 30.0–36.0)
MCV: 89.5 fL (ref 80.0–100.0)
Platelets: 300 10*3/uL (ref 150–400)
RBC: 4.88 MIL/uL (ref 3.87–5.11)
RDW: 12.2 % (ref 11.5–15.5)
WBC: 7.1 10*3/uL (ref 4.0–10.5)
nRBC: 0 % (ref 0.0–0.2)

## 2021-03-08 MED ORDER — MEDROXYPROGESTERONE ACETATE 10 MG PO TABS: 10.0000 mg | ORAL_TABLET | Freq: Every day | ORAL | 0 refills | Status: DC

## 2021-03-08 NOTE — MAU Provider Note (Signed)
History     CSN: [id number]  Arrival date and time: 03/08/21 1320   Event Date/Time   First Provider Initiated Contact with Patient 03/08/21 1421      Chief Complaint  Patient presents with   Vaginal Bleeding   25 y.o. [id number] s/p SVD 5 weeks ago presenting with VB. Reports daily VB since delivery. States she saturates 8-9 pads a day. Passes small clots at times. Reports "sour smell". Has not used tampons. Reports about 2-3 when she didn't have bleeding and was about 2 weeks ago. Has not had sex since delivery. Denies fever. No abd pain or pelvic pain. Endorses weakness and lightheadedness. She is formula feeding.   OB History     Gravida  3   Para  1   Term      Preterm  1   AB  2   Living  1      SAB  2   IAB      Ectopic      Multiple  0   Live Births  1           Past Medical History:  Diagnosis Date   ADHD (attention deficit hyperactivity disorder)    Anxiety    Depression    Epilepsy (HCC)    Headache(784.0)    Obesity    Seizures (HCC)    last seizure 08/29/19   Seizures (HCC)     Past Surgical History:  Procedure Laterality Date   ADENOIDECTOMY     ANKLE SURGERY     ANKLE SURGERY  at 25yo   Pt. reports to lengthen her tendons   TONSILLECTOMY      Family History  Problem Relation Age of Onset   Bipolar disorder Mother    Cancer Mother    Diabetes Mother    Hypertension Mother    Seizures Mother    Hypertension Father    Stroke Father    Seizures Father    Seizures Brother    Heart disease Maternal Grandfather    Heart attack Maternal Grandfather     Social History   Tobacco Use   Smoking status: Former    Packs/day: 0.10    Types: Cigarettes    Quit date: 01/13/2016    Years since quitting: 5.1   Smokeless tobacco: Never  Vaping Use   Vaping Use: Never used  Substance Use Topics   Alcohol use: No   Drug use: Not Currently    Frequency: 7.0 times per week    Types: Marijuana    Comment: none since pregnancy     Allergies:  Allergies  Allergen Reactions   Peanut-Containing Drug Products Anaphylaxis    Stomach bubbles with Peanut oil only  (Patient states that she is not allergic to this)   Latex    Soy Allergy     hives   Tomato     Throat swelling, shortness of breath     No medications prior to admission.    Review of Systems  Gastrointestinal:  Negative for abdominal pain.  Genitourinary:  Positive for vaginal bleeding. Negative for pelvic pain.  Neurological:  Positive for weakness and light-headedness. Negative for syncope.  Physical Exam   Blood pressure 127/73, pulse 74, temperature 99 F (37.2 C), resp. rate 16, height 5\' 4"  (1.626 m), weight 99.4 kg, SpO2 98 %, not currently breastfeeding. Orthostatic VS for the past 24 hrs:  BP- Lying Pulse- Lying BP- Sitting Pulse- Sitting BP- Standing at 0 minutes Pulse-  Standing at 0 minutes  03/08/21 1447 127/70 70 126/84 79 136/70 89    Physical Exam Vitals and nursing note reviewed. Exam conducted with a chaperone present.  Constitutional:      General: She is not in acute distress.    Appearance: Normal appearance.  HENT:     Head: Normocephalic and atraumatic.  Cardiovascular:     Rate and Rhythm: Normal rate.  Pulmonary:     Effort: Pulmonary effort is normal. No respiratory distress.  Abdominal:     General: There is no distension.     Palpations: Abdomen is soft. There is no mass.     Tenderness: There is no abdominal tenderness. There is no guarding or rebound.     Hernia: No hernia is present.  Genitourinary:    Comments: External: no lesions or erythema Vagina: rugated, pink, moist, small amt bloody discharge cleared with 1 fox swab Uterus: nonenlarged, anteverted, nontender, no CMT Adnexae: no masses, no tenderness left, no tenderness right Cervix pink, moist, closed   Musculoskeletal:        General: Normal range of motion.     Cervical back: Normal range of motion.  Skin:    General: Skin is warm and  dry.  Neurological:     General: No focal deficit present.     Mental Status: She is alert and oriented to person, place, and time.  Psychiatric:        Mood and Affect: Mood normal.        Behavior: Behavior normal.   Results for orders placed or performed during the hospital encounter of 03/08/21 (from the past 24 hour(s))  CBC     Status: None   Collection Time: 03/08/21  2:13 PM  Result Value Ref Range   WBC 7.1 4.0 - 10.5 K/uL   RBC 4.88 3.87 - 5.11 MIL/uL   Hemoglobin 14.6 12.0 - 15.0 g/dL   HCT 66.4 40.3 - 47.4 %   MCV 89.5 80.0 - 100.0 fL   MCH 29.9 26.0 - 34.0 pg   MCHC 33.4 30.0 - 36.0 g/dL   RDW 25.9 56.3 - 87.5 %   Platelets 300 150 - 400 K/uL   nRBC 0.0 0.0 - 0.2 %   US PELVIS (TRANSABDOMINAL ONLY)  Result Date: 03/08/2021 CLINICAL DATA:  Heavy postpartum bleeding EXAM: TRANSABDOMINAL ULTRASOUND OF PELVIS TECHNIQUE: Transabdominal ultrasound examination of the pelvis was performed including evaluation of the uterus, ovaries, adnexal regions, and pelvic cul-de-sac. COMPARISON:  None. FINDINGS: Uterus Measurements: 9.7 x 4.9 x 8.2 cm = volume: 205.1 mL. No fibroids or other mass visualized. Endometrium Thickness: 6.3 mm.  No focal abnormality visualized. Right ovary Measurements: 3.1 x 1.5 x 2.6 cm = volume: 6.4 mL. Normal appearance/no adnexal mass. Left ovary Measurements: 2.8 x 2 x 2.3 cm = volume: 6.8 mL. Normal appearance/no adnexal mass. Other findings:  No abnormal free fluid. IMPRESSION: Negative pelvic ultrasound. No evidence of retained products of conception. Electronically Signed   By: Jasmine Pang M.D.   On: 03/08/2021 15:47    MAU Course  Procedures  MDM Labs and [place] ordered and reviewed. Not orthostatic. No anemia. No signs of retained POCs or endometritis. Pt reassured. Current bleeding may represent first menses since delivery. Pt desires IUD for contraception, will Rx Provera until she can have that placed in office. Recommend abstinence until IUD  placement. Stable for discharge home.  Assessment and Plan   1. Postpartum bleeding    Discharge home Follow up at  [facility] in 1 week for pp visit and IUD placement Rx Provera Return precautions  Allergies as of 03/08/2021       Reactions   Peanut-containing Drug Products Anaphylaxis   Stomach bubbles with Peanut oil only (Patient states that she is not allergic to this)   Latex    Soy Allergy    hives   Tomato    Throat swelling, shortness of breath        Medication List     TAKE these medications    acetaminophen 500 MG tablet Commonly known as: TYLENOL Take 2 tablets (1,000 mg total) by mouth every 8 (eight) hours as needed (pain).   Blood Pressure Monitor Misc For regular home bp monitoring during pregnancy   [employer] PRENATAL GUMMY PO Take 150 mg by mouth 2 (two) times daily.   ibuprofen 600 MG tablet Commonly known as: ADVIL Take 1 tablet (600 mg total) by mouth every 6 (six) hours as needed (pain).   medroxyPROGESTERone 10 MG tablet Commonly known as: PROVERA Take 1 tablet (10 mg total) by mouth daily.        Donette Larry, CNM 03/08/2021, 4:56 PM

## 2021-03-08 NOTE — MAU Note (Signed)
Pamela Mccarty is a 25 y.o. here in MAU reporting: s/p SVD on 10/25. States she is still bleeding. Is changing 9-10 pads per day. States saturated. Having some small clots. No pain.  Onset of complaint: ongoing  Pain score: 0/10  Vitals:   03/08/21 1351  BP: 133/73  Pulse: 72  Resp: 16  Temp: 99 F (37.2 C)  SpO2: 98%     Lab orders placed from triage: none

## 2021-03-12 DIAGNOSIS — R569 Unspecified convulsions: Secondary | ICD-10-CM | POA: Insufficient documentation

## 2021-03-12 NOTE — Telephone Encounter (Signed)
Patient left a voicemail on my phone wanting to schedule and MRI but I do not see one ordered.

## 2021-03-12 NOTE — Telephone Encounter (Addendum)
Patient was seen by [name] in December 2021 procedure, MRI of the brain was ordered initially, then canceled due to pregnancy  She had a vaginal delivery on January 30, 2021,  I will proceed with MRI of the brain with without contrast   Gadolinium-based contrast agents are present at very low levels in human milk and not absorbed well by the infant gut; no adverse effects have been reported in infants exposed through lactation [ If she is breast feeding, she should express and discard milk for 24 hours after the imaging study,  Please also schedule a follow up with NP after MRI brain Orders Placed This Encounter  Procedures   MR BRAIN W WO CONTRAST

## 2021-03-12 NOTE — Addendum Note (Signed)
Addended by: Levert Feinstein on: 03/12/2021 10:51 AM   Modules accepted: Orders

## 2021-03-12 NOTE — Telephone Encounter (Signed)
I spoke with the patient and she verbalized understanding of the message and is agreeable to getting the MRI. Pt states she is not breast feeding. Follow up scheduled. All questions answered and patient verbalized appreciation for the call.

## 2021-03-17 ENCOUNTER — Ambulatory Visit: Payer: Self-pay

## 2021-03-17 NOTE — Lactation Note (Signed)
This note was copied from a baby's chart. Lactation Consultation Note Mother is no longer pumping. LC services are complete at this time.  Patient Name: Pamela Mccarty DPOEU'M Date: 03/17/2021   Age:23 wk.o.  Elder Negus 03/17/2021, 3:10 PM

## 2021-03-21 ENCOUNTER — Ambulatory Visit: Payer: Medicaid Other | Admitting: Advanced Practice Midwife

## 2021-03-28 NOTE — Telephone Encounter (Signed)
[facility] pending faxed notes  

## 2021-03-29 ENCOUNTER — Ambulatory Visit: Payer: Medicaid Other | Admitting: Women's Health

## 2021-03-29 ENCOUNTER — Telehealth: Payer: Self-pay | Admitting: Neurology

## 2021-03-29 NOTE — Telephone Encounter (Signed)
[facility]kley Harvey: [id number] (exp. 03/28/21 to 04/27/21). Order sent to GI, they will reach out to the patient to schedule.

## 2021-04-04 ENCOUNTER — Ambulatory Visit: Payer: Medicaid Other | Admitting: Neurology

## 2021-04-04 ENCOUNTER — Other Ambulatory Visit: Payer: Self-pay

## 2021-04-04 ENCOUNTER — Encounter: Payer: Self-pay | Admitting: Neurology

## 2021-04-04 VITALS — BP 125/82 | HR 86 | Ht 65.0 in | Wt 219.0 lb

## 2021-04-04 DIAGNOSIS — R569 Unspecified convulsions: Secondary | ICD-10-CM

## 2021-04-04 NOTE — Progress Notes (Signed)
PATIENT: Pamela Mccarty DOB: Nov 10, 1995  REASON FOR VISIT: Follow up for seizure like spells HISTORY FROM: Patient PRIMARY NEUROLOGIST: Dr. Teresa Coombs since [name] has retired   HISTORY OF PRESENT ILLNESS: Today 04/04/21 Pamela Mccarty is a 25 year old female with history of seizures since 25 years old. EEG was unremarkable in December 2021.  MRI of the brain was initially ordered in December 2021, was cancelled in June 2022 when she was pregnant.  Had a vaginal delivery in October 2022, baby is still in NICU.  MRI of the brain is still pending.  Stopped Keppra, reports that induced seizures.  Continues to report seizure events 2-3 times a week.  Before spell, will feel dizzy, then will blackout, will have shaking of all extremities, occasionally bite her tongue, usually has urinary incontinence.  Spells are triggered by horror movies, or stress.  She does not drive a car.  Is not currently employed.  Tells me she knows when the spells are gone to happen.  Her fianc says her eyes will roll back beforehand. She claims she has never injured herself from the spells.  Here today accompanied by her fianc, Pamela Mccarty.  HISTORY 03/14/2020 [name]: Ms. Widener is a 25 year old left-handed white female with a history she claims of seizure events since she was 20 or 25 years old. She claims that her mother and her father and her brother also have seizures. The patient claims that she has never been followed through a neurologist, she has only been getting her medications through the emergency room when she gets her seizures. The patient claims that her seizures generally occur 2-3 times a week but over the last month they have become daily, she is having 2-3 events a day now. The patient states that the seizures are associated with sudden blackout, she may have some dizziness and headache before the onset of the seizure. She claims that she will bite her tongue, have generalized jerking and may have some  urinary or bowel incontinence. She may occasionally hit her head when she has a seizure. She reports some bilateral hand numbness at times and some weakness in the low back. She has been placed on Keppra and the dose was recently increased after ER visit on 08 March 2020. She claims that on Keppra her seizures seem to be getting worse. She has undergone a CT scan of the brain has been unremarkable, she has never had an EEG study or an MRI study. She is sent to this office for an evaluation.   REVIEW OF SYSTEMS: Out of a complete 14 system review of symptoms, the patient complains only of the following symptoms, and all other reviewed systems are negative.  See HPI  ALLERGIES: Allergies  Allergen Reactions   Peanut-Containing Drug Products Anaphylaxis    Stomach bubbles with Peanut oil only  (Patient states that she is not allergic to this)   Latex    Soy Allergy     hives   Tomato     Throat swelling, shortness of breath     HOME MEDICATIONS: Outpatient Medications Prior to Visit  Medication Sig Dispense Refill   acetaminophen (TYLENOL) 500 MG tablet Take 2 tablets (1,000 mg total) by mouth every 8 (eight) hours as needed (pain). 60 tablet 0   Blood Pressure Monitor MISC For regular home bp monitoring during pregnancy 1 each 0   ibuprofen (ADVIL) 600 MG tablet Take 1 tablet (600 mg total) by mouth every 6 (six) hours as needed (pain).  40 tablet 0   medroxyPROGESTERone (PROVERA) 10 MG tablet Take 1 tablet (10 mg total) by mouth daily. 10 tablet 0   Prenatal MV & Min w/FA-DHA ([facility] PRENATAL GUMMY PO) Take 150 mg by mouth 2 (two) times daily.     No facility-administered medications prior to visit.    PAST MEDICAL HISTORY: Past Medical History:  Diagnosis Date   ADHD (attention deficit hyperactivity disorder)    Anxiety    Depression    Epilepsy (HCC)    Headache(784.0)    Obesity    Seizures (HCC)    last seizure 08/29/19   Seizures (HCC)     PAST SURGICAL HISTORY: Past  Surgical History:  Procedure Laterality Date   ADENOIDECTOMY     ANKLE SURGERY     ANKLE SURGERY  at 25yo   Pt. reports to lengthen her tendons   TONSILLECTOMY      FAMILY HISTORY: Family History  Problem Relation Age of Onset   Bipolar disorder Mother    Cancer Mother    Diabetes Mother    Hypertension Mother    Seizures Mother    Hypertension Father    Stroke Father    Seizures Father    Seizures Brother    Heart disease Maternal Grandfather    Heart attack Maternal Grandfather     SOCIAL HISTORY: Social History   Socioeconomic History   Marital status: Media planner    Spouse name: Pamela Mccarty   Number of children: Not on file   Years of education: Not on file   Highest education level: Not on file  Occupational History   Occupation: Full Time    Comment: [employer]  Tobacco Use   Smoking status: Former    Packs/day: 0.10    Types: Cigarettes    Quit date: 01/13/2016    Years since quitting: 5.2   Smokeless tobacco: Never  Vaping Use   Vaping Use: Never used  Substance and Sexual Activity   Alcohol use: No   Drug use: Not Currently    Frequency: 7.0 times per week    Types: Marijuana    Comment: none since pregnancy   Sexual activity: Yes    Birth control/protection: None    Comment: reports she is "gay", has had a boyfriend in past, but states she is only interested in girls, does not want mom to know  Other Topics Concern   Not on file  Social History Narrative   Lives with Pamela Mccarty and his mom and dad   Left Handed   Drinks 7-8 cups caffeine   Social Determinants of Health   Financial Resource Strain: Low Risk    Difficulty of Paying Living Expenses: Not hard at all  Food Insecurity: Food Insecurity Present   Worried About Programme researcher, broadcasting/film/video in the Last Year: Never true   [employer] of Food in the Last Year: Sometimes true  Transportation Needs: No Transportation Needs   Lack of Transportation (Medical): No   Lack of Transportation (Non-Medical): No   Physical Activity: Insufficiently Active   Days of Exercise per Week: 2 days   Minutes of Exercise per Session: 60 min  Stress: No Stress Concern Present   Feeling of Stress : Not at all  Social Connections: Socially Integrated   Frequency of Communication with Friends and Family: More than three times a week   Frequency of Social Gatherings with Friends and Family: More than three times a week   Attends Religious Services: More than 4 times  per year   Active Member of Clubs or Organizations: No   Attends Engineer, structural: More than 4 times per year   Marital Status: Living with partner  Intimate Partner Violence: Not At Risk   Fear of Current or Ex-Partner: No   Emotionally Abused: No   Physically Abused: No   Sexually Abused: No   PHYSICAL EXAM  Vitals:   04/04/21 0955  BP: 125/82  Pulse: 86  Weight: 219 lb (99.3 kg)  Height: 5\' 5"  (1.651 m)   Body mass index is 36.44 kg/m.  Generalized: Well developed, in no acute distress, disheveled Neurological examination  Mentation: Alert oriented to time, place, history taking. Follows all commands speech and language fluent Cranial nerve II-XII: Pupils were equal round reactive to light. Extraocular movements were full, visual field were full on confrontational test. Facial sensation and strength were normal. Head turning and shoulder shrug  were normal and symmetric. Motor: The motor testing reveals 5 over 5 strength of all 4 extremities. Good symmetric motor tone is noted throughout.  Sensory: Sensory testing is intact to soft touch on all 4 extremities. No evidence of extinction is noted.  Coordination: Cerebellar testing reveals good finger-nose-finger and heel-to-shin bilaterally.  Gait and station: Gait is normal. Tandem gait is normal.  Reflexes: Deep tendon reflexes are symmetric and normal bilaterally.   DIAGNOSTIC DATA (LABS, IMAGING, TESTING) - I reviewed patient records, labs, notes, testing and imaging  myself where available.  Lab Results  Component Value Date   WBC 7.1 03/08/2021   HGB 14.6 03/08/2021   HCT 43.7 03/08/2021   MCV 89.5 03/08/2021   PLT 300 03/08/2021      Component Value Date/Time   NA 138 02/02/2021 2023   K 3.6 02/02/2021 2023   CL 106 02/02/2021 2023   CO2 23 02/02/2021 2023   GLUCOSE 82 02/02/2021 2023   BUN 12 02/02/2021 2023   CREATININE 0.73 02/02/2021 2023   CALCIUM 8.8 (L) 02/02/2021 2023   PROT 6.0 (L) 02/02/2021 2023   ALBUMIN 2.7 (L) 02/02/2021 2023   AST 19 02/02/2021 2023   ALT 22 02/02/2021 2023   ALKPHOS 71 02/02/2021 2023   BILITOT 0.4 02/02/2021 2023   GFRNONAA >60 02/02/2021 2023   GFRAA >60 11/02/2019 1633   No results found for: CHOL, HDL, LDLCALC, LDLDIRECT, TRIG, CHOLHDL Lab Results  Component Value Date   HGBA1C 4.8 09/18/2020   No results found for: VITAMINB12 Lab Results  Component Value Date   TSH 1.970 07/27/2018   ASSESSMENT AND PLAN 25 y.o. year old female  has a past medical history of ADHD (attention deficit hyperactivity disorder), Anxiety, Depression, Epilepsy (HCC), Headache(784.0), Obesity, Seizures (HCC), and Seizures (HCC). here with:  1.  Reported seizure-like spells  -Suspicious for nonepileptic events, claims 2-3 times a week -Will get EEG at our office  -Referral to Dr. 30 for EMU admission -Claims Keppra triggered spells, will hold off on starting another AED at this time -Given # for [facility] to call and schedule MRI of the brain with and without contrast -Asked to document spells, and triggers -She is not to drive a car -Follow-up in 4 months or sooner if needed, will be followed by [facility] since Dr. Teresa Coombs has retired   I spent 35 minutes of face-to-face and non-face-to-face time with patient.  This included previsit chart review, lab review, study review, order entry, electronic health record documentation, patient education, discussing plan, testing needed, and follow up.  Margie Ege, AGNP-C, DNP 04/04/2021, 10:40 AM [facility] [address], [address] [place], [place] [address] [phone]

## 2021-04-04 NOTE — Patient Instructions (Signed)
Check EEG  Referral to EMU Dr. Melynda Ripple  Document spells, triggers Need to set up MRI of the brain  No driving  See you back in 4 months with me

## 2021-04-05 ENCOUNTER — Telehealth: Payer: Self-pay | Admitting: Neurology

## 2021-04-05 NOTE — Telephone Encounter (Signed)
Referral sent to [facility]. Phone: [phone].

## 2021-04-12 ENCOUNTER — Ambulatory Visit (INDEPENDENT_AMBULATORY_CARE_PROVIDER_SITE_OTHER): Payer: Medicaid Other | Admitting: Neurology

## 2021-04-12 DIAGNOSIS — R569 Unspecified convulsions: Secondary | ICD-10-CM | POA: Diagnosis not present

## 2021-04-13 NOTE — Procedures (Signed)
° ° °  History:  26 year old woman with seizure disorder  EEG classification: Awake and drowsy  Description of the recording: The background rhythms of this recording consists of a fairly well modulated medium amplitude alpha rhythm of 10-11 Hz that is reactive to eye opening and closure. As the record progresses, the patient appears to remain in the waking state throughout the recording. Photic stimulation was performed, did not show any abnormalities. Hyperventilation was also performed, did not show any abnormalities. Toward the end of the recording, the patient enters the drowsy state with slight symmetric slowing seen. The patient never enters stage II sleep. No abnormal epileptiform discharges seen during this recording. There was no focal slowing. EKG monitor shows no evidence of cardiac rhythm abnormalities with a heart rate of 72.  Impression: This is a normal EEG recording in the waking and drowsy state. No evidence of interictal epileptiform discharges seen. A normal EEG does not exclude a diagnosis of epilepsy.    Windell Norfolk, MD [facility]

## 2021-04-16 ENCOUNTER — Telehealth: Payer: Self-pay | Admitting: Neurology

## 2021-04-16 NOTE — Telephone Encounter (Signed)
Please call the patient, EEG was normal. When last seen in office, referral for EMU was placed. Can you follow up on that, see if she has been contacted, would be the next step to investigate her frequent episodes.    Impression: This is a normal EEG recording in the waking and drowsy state. No evidence of interictal epileptiform discharges seen. A normal EEG does not exclude a diagnosis of epilepsy.

## 2021-04-17 ENCOUNTER — Encounter: Payer: Self-pay | Admitting: *Deleted

## 2021-04-17 NOTE — Telephone Encounter (Signed)
I called patient to discuss. No answer, left a message asking her to call us back. Please route to POD 2 calls.

## 2021-04-17 NOTE — Telephone Encounter (Addendum)
Left second message for a return call. Also, sent a mychart message.

## 2021-04-18 NOTE — Telephone Encounter (Addendum)
Left third message for a return call. 4 attempts have been made to reach this patient. I will mail letter to patient asking her to call us to review EEG study as well. We will wait to hear back from the pt at this point.

## 2021-04-23 ENCOUNTER — Ambulatory Visit: Payer: Medicaid Other | Admitting: Women's Health

## 2021-05-01 ENCOUNTER — Encounter (HOSPITAL_COMMUNITY): Payer: Self-pay

## 2021-05-01 ENCOUNTER — Emergency Department (HOSPITAL_COMMUNITY)
Admission: EM | Admit: 2021-05-01 | Discharge: 2021-05-01 | Disposition: A | Discharge status: Home / Self Care | Payer: Medicaid Other | Attending: Emergency Medicine | Admitting: Emergency Medicine

## 2021-05-01 ENCOUNTER — Other Ambulatory Visit: Payer: Self-pay

## 2021-05-01 DIAGNOSIS — S30861A Insect bite (nonvenomous) of abdominal wall, initial encounter: Secondary | ICD-10-CM | POA: Insufficient documentation

## 2021-05-01 DIAGNOSIS — Z91010 Allergy to peanuts: Secondary | ICD-10-CM | POA: Diagnosis not present

## 2021-05-01 DIAGNOSIS — Z91040 Latex allergy status: Secondary | ICD-10-CM | POA: Insufficient documentation

## 2021-05-01 DIAGNOSIS — W57XXXA Bitten or stung by nonvenomous insect and other nonvenomous arthropods, initial encounter: Secondary | ICD-10-CM | POA: Insufficient documentation

## 2021-05-01 DIAGNOSIS — S30821A Blister (nonthermal) of abdominal wall, initial encounter: Secondary | ICD-10-CM | POA: Diagnosis not present

## 2021-05-01 DIAGNOSIS — T148XXA Other injury of unspecified body region, initial encounter: Secondary | ICD-10-CM

## 2021-05-01 MED ORDER — KETOROLAC TROMETHAMINE 15 MG/ML IJ SOLN
15.0000 mg | Freq: Once | INTRAMUSCULAR | Status: AC
  Administered 2021-05-01: 22:00:00 15 mg via INTRAMUSCULAR
  Filled 2021-05-01: qty 1

## 2021-05-01 NOTE — ED Triage Notes (Signed)
Spider bite to right lower abdomen. She noticed it today. Bruising noted. No drainage.

## 2021-05-01 NOTE — ED Provider Notes (Signed)
[facility] EMERGENCY DEPARTMENT Provider Note   CSN: [id number] Arrival date & time: 05/01/21  2106     History  Chief Complaint  Patient presents with   Insect Bite    Pamela Mccarty is a 26 y.o. female this is a well appearing patient who presents with concern for spider bite to her right lower abdomen earlier today prior to arrival.  Patient reports that she did find a dead spider in her bed after she woke up from a nap, with new wound in place.  Does not remember being bitten.  Denies fever, chills, chest pain, shortness of breath.  Reports that it stings significantly, notes a small bruise versus blood blister.  Did not take anything for pain prior to arrival.  History of depression.  HPI     Home Medications Prior to Admission medications   Medication Sig Start Date End Date Taking? Authorizing Provider  acetaminophen (TYLENOL) 500 MG tablet Take 2 tablets (1,000 mg total) by mouth every 8 (eight) hours as needed (pain). 02/01/21   Worthy Rancher, MD  Blood Pressure Monitor MISC For regular home bp monitoring during pregnancy 09/18/20   Cheral Marker, CNM  ibuprofen (ADVIL) 600 MG tablet Take 1 tablet (600 mg total) by mouth every 6 (six) hours as needed (pain). 02/01/21   Worthy Rancher, MD  medroxyPROGESTERone (PROVERA) 10 MG tablet Take 1 tablet (10 mg total) by mouth daily. 03/08/21   Donette Larry, CNM  Prenatal MV & Min w/FA-DHA ([facility] PRENATAL GUMMY PO) Take 150 mg by mouth 2 (two) times daily.    [provider]      Allergies    Peanut-containing drug products, Latex, Soy allergy, and Tomato    Review of Systems   Review of Systems  Skin:  Positive for wound.  All other systems reviewed and are negative.  Physical Exam Updated Vital Signs BP 125/87    Pulse 88    Temp 99.5 F (37.5 C)    Resp 16    Ht 5\' 5"  (1.651 m)    Wt 97.5 kg    SpO2 98%    BMI 35.78 kg/m  Physical Exam Vitals and nursing note reviewed.  Constitutional:       General: She is not in acute distress.    Appearance: Normal appearance.  HENT:     Head: Normocephalic and atraumatic.  Eyes:     General:        Right eye: No discharge.        Left eye: No discharge.  Cardiovascular:     Rate and Rhythm: Normal rate and regular rhythm.  Pulmonary:     Effort: Pulmonary effort is normal. No respiratory distress.  Musculoskeletal:        General: No deformity.  Skin:    General: Skin is warm and dry.     Comments: Patient with small perhaps 2 mm area of erythema with central blood blister versus raised ecchymosis in the right lower quadrant of the abdomen.  There is no significant fluctuance, there is no feeling of underlying fluid pocket.  There is no tracking redness, all of the erythema is localized.  Neurological:     Mental Status: She is alert and oriented to person, place, and time.  Psychiatric:        Mood and Affect: Mood normal.        Behavior: Behavior normal.    ED Results / Procedures / Treatments   Labs (all  labs ordered are listed, but only abnormal results are displayed) Labs Reviewed - No data to display  EKG None  Radiology No results found.  Procedures Procedures    Medications Ordered in ED Medications  ketorolac (TORADOL) 15 MG/ML injection 15 mg (15 mg Intramuscular Given 05/01/21 2151)    ED Course/ Medical Decision Making/ A&P                           Medical Decision Making Risk Prescription drug management.   Comorbidities of depression, obesity. Social determinant of poor health literacy. I reviewed her previous ED visits.   Overall well-appearing 26 year old female arrives with complaint of possible bug bite right lower quadrant of her abdomen.  She has stable vital signs, reports that the wound stings, but denies pain anywhere else, fever, muscle aches or pains, chest pain, or confusion.  My physical exam is significant for possible early signs of bug bite versus clinical signs of small blood  blister.  Patient did not witness the bite, although reports that she saw a dead spider in her bed.  Clinically shows no signs of severe spider bite such as black widow or brown recluse.  Does not show any signs of infection at this point.  I suspect that patient may have pinched part of her skin in between 2 surfaces versus possible true noninfected insect bite.  Encouraged her to keep wound clean. Follow up for any worsening redness, fever. Patient discharged in stable condition, return precautions given. Final Clinical Impression(s) / ED Diagnoses Final diagnoses:  Blood blister  Insect bite of abdominal wall, initial encounter    Rx / DC Orders ED Discharge Orders     None         [place] 05/01/21 2328    Gerhard Munch, MD 05/01/21 [phone]

## 2021-05-01 NOTE — Discharge Instructions (Signed)
Please use Tylenol or ibuprofen for pain.  You may use 600 mg ibuprofen every 6 hours or 1000 mg of Tylenol every 6 hours.  You may choose to alternate between the 2.  This would be most effective.  Not to exceed 4 g of Tylenol within 24 hours.  Not to exceed 3200 mg ibuprofen 24 hours.  As we discussed please continue to monitor for worsening redness, purulent drainage.  If you notice worsening redness, or drainage please return for further evaluation.  Otherwise continue to keep the site clean, and manage her pain as above.

## 2021-05-11 ENCOUNTER — Other Ambulatory Visit: Payer: Self-pay

## 2021-05-11 ENCOUNTER — Emergency Department (HOSPITAL_COMMUNITY)
Admission: EM | Admit: 2021-05-11 | Discharge: 2021-05-11 | Disposition: A | Discharge status: Home / Self Care | Payer: Medicaid Other | Attending: Emergency Medicine | Admitting: Emergency Medicine

## 2021-05-11 DIAGNOSIS — L02415 Cutaneous abscess of right lower limb: Secondary | ICD-10-CM | POA: Insufficient documentation

## 2021-05-11 DIAGNOSIS — R000 Tachycardia, unspecified: Secondary | ICD-10-CM | POA: Insufficient documentation

## 2021-05-11 DIAGNOSIS — L0291 Cutaneous abscess, unspecified: Secondary | ICD-10-CM

## 2021-05-11 DIAGNOSIS — Z91040 Latex allergy status: Secondary | ICD-10-CM | POA: Insufficient documentation

## 2021-05-11 DIAGNOSIS — Z91010 Allergy to peanuts: Secondary | ICD-10-CM | POA: Insufficient documentation

## 2021-05-11 MED ORDER — LIDOCAINE-EPINEPHRINE (PF) 2 %-1:200000 IJ SOLN
5.0000 mL | Freq: Once | INTRAMUSCULAR | Status: AC
  Administered 2021-05-11: 5 mL
  Filled 2021-05-11: qty 20

## 2021-05-11 NOTE — Discharge Instructions (Signed)
abscess has been drained at this time I recommend applying warm compress to the area 2-3 times daily please change the dressings.  You may use over-the-counter pain medication as needed  If you develop worsening redness pain fevers or chills after 72 hours please come back for reevaluation.  Come back to the emergency department if you develop chest pain, shortness of breath, severe abdominal pain, uncontrolled nausea, vomiting, diarrhea.

## 2021-05-11 NOTE — ED Provider Notes (Signed)
[facility] EMERGENCY DEPARTMENT Provider Note   CSN: [id number] Arrival date & time: 05/11/21  1808     History  No chief complaint on file.   Pamela Mccarty is a 26 y.o. female.  HPI  Patient without significant medical history presents with complaints of a boil on her right upper leg.  Noted today, states has gotten larger in size become more painful, denies any drainage or discharge from the area, has been unable to lance it on her own, no associated fevers or chills, no increase in redness, states that she has had in the past and had to be drained, she is up-to-date on her tetanus shot, not immunocompromise, has no other complaints at this time.  Home Medications Prior to Admission medications   Medication Sig Start Date End Date Taking? Authorizing Provider  acetaminophen (TYLENOL) 500 MG tablet Take 2 tablets (1,000 mg total) by mouth every 8 (eight) hours as needed (pain). 02/01/21   Genia Del, MD  Blood Pressure Monitor MISC For regular home bp monitoring during pregnancy 09/18/20   Roma Schanz, CNM  ibuprofen (ADVIL) 600 MG tablet Take 1 tablet (600 mg total) by mouth every 6 (six) hours as needed (pain). 02/01/21   Genia Del, MD  medroxyPROGESTERone (PROVERA) 10 MG tablet Take 1 tablet (10 mg total) by mouth daily. 03/08/21   Julianne Handler, CNM  Prenatal MV & Min w/FA-DHA ([employer] PRENATAL GUMMY PO) Take 150 mg by mouth 2 (two) times daily.    [provider]      Allergies    Peanut-containing drug products, Latex, Soy allergy, and Tomato    Review of Systems   Review of Systems  Constitutional:  Negative for chills and fever.  Respiratory:  Negative for shortness of breath.   Cardiovascular:  Negative for chest pain.  Gastrointestinal:  Negative for abdominal pain.  Skin:  Positive for wound.  Neurological:  Negative for headaches.   Physical Exam Updated Vital Signs BP 136/79 (BP Location: Right Arm)    Pulse (!) 109     Temp 98.6 F (37 C) (Oral)    Resp 17    Ht 5\' 10"  (1.778 m)    Wt 99.8 kg    SpO2 100%    BMI 31.57 kg/m  Physical Exam Vitals and nursing note reviewed. Exam conducted with a chaperone present.  Constitutional:      General: She is not in acute distress.    Appearance: She is not ill-appearing.  HENT:     Head: Normocephalic and atraumatic.     Nose: No congestion.  Eyes:     Conjunctiva/sclera: Conjunctivae normal.  Cardiovascular:     Rate and Rhythm: Regular rhythm. Tachycardia present.  Pulmonary:     Effort: Pulmonary effort is normal.  Skin:    General: Skin is warm and dry.     Comments: With chaperone present patient has a noted abscess on the mid upper right thigh measuring approximately the size of a quarter, erythema present fluctuance noted as well as induration no drainage or discharge present  Neurological:     Mental Status: She is alert.  Psychiatric:        Mood and Affect: Mood normal.    ED Results / Procedures / Treatments   Labs (all labs ordered are listed, but only abnormal results are displayed) Labs Reviewed - No data to display  EKG None  Radiology No results found.  Procedures .Marland KitchenIncision and Drainage  Date/Time: 05/11/2021  9:36 PM Performed by: Marcello Fennel, PA-C Authorized by: Marcello Fennel, PA-C   Consent:    Consent obtained:  Verbal   Consent given by:  Patient   Risks discussed:  Bleeding, incomplete drainage, pain, damage to other organs and infection   Alternatives discussed:  No treatment, delayed treatment, alternative treatment, observation and referral Universal protocol:    Patient identity confirmed:  Verbally with patient Location:    Type:  Abscess   Size:  3cm   Location:  Lower extremity   Lower extremity location:  Leg   Leg location:  R upper leg Pre-procedure details:    Skin preparation:  Antiseptic wash Sedation:    Sedation type:  None Anesthesia:    Anesthesia method:  Local infiltration    Local anesthetic:  Lidocaine 2% WITH epi Procedure type:    Complexity:  Simple Procedure details:    Ultrasound guidance: no     Needle aspiration: no     Incision types:  Single straight   Incision depth:  Subcutaneous   Wound management:  Probed and deloculated   Drainage:  Bloody and purulent   Drainage amount:  Moderate   Wound treatment:  Wound left open   Packing materials:  None Post-procedure details:    Procedure completion:  Tolerated well, no immediate complications    Medications Ordered in ED Medications  lidocaine-EPINEPHrine (XYLOCAINE W/EPI) 2 %-1:200000 (PF) injection 5 mL (5 mLs Infiltration Given by Other 05/11/21 2008)    ED Course/ Medical Decision Making/ A&P                           Medical Decision Making Risk Prescription drug management.   This patient presents to the ED for concern of abscess, this involves an extensive number of treatment options, and is a complaint that carries with it a high risk of complications and morbidity.  The differential diagnosis includes necrotizing fasciitis, sepsis, cellulitis    Additional history obtained:  Additional history obtained from N/A    Co morbidities that complicate the patient evaluation  N/A  Social Determinants of Health:  N/A    Lab Tests:  I Ordered, and personally interpreted labs.  The pertinent results include: N/A   Imaging Studies ordered:  I ordered imaging studies including N/A     Reevaluation:  Patient with skin abscess  located right upper leg, amenable to incision and drainage.  Patient tolerated procedure well.  Abscess was not large enough to warrant packing or drain patient tolerated the procedure well    Rule out Low suspicion for systemic infection patient is nontoxic-appearing vital signs are reassuring.  Low suspicion for necrotizing fasciitis no necrotic skin presentation atypical etiology.  Low suspicion for cellulitis antibiotics will be deferred at  this time as patient is not immunocompromise, nontoxic-appearing.    Dispostion and problem list  After consideration of the diagnostic results and the patients response to treatment, I feel that the patent would benefit from symptomatic care follow-up as needed.  Abscess-actively draining at this time recommend warm compresses and over-the-counter pain medication as needed we will have her come back in 48 hours symptoms are worsening.  Gave strict return precautions.  Follow-up PCP as needed            Final Clinical Impression(s) / ED Diagnoses Final diagnoses:  Abscess    Rx / DC Orders ED Discharge Orders     None  Marcello Fennel, PA-C 05/11/21 2140    Margette Fast, MD 05/22/21 [phone]

## 2021-05-28 ENCOUNTER — Telehealth: Payer: Self-pay | Admitting: Neurology

## 2021-05-28 NOTE — Telephone Encounter (Signed)
Pt would like a call from back to discuss when to schedule long term EEG at [facility] and EMU.

## 2021-05-30 NOTE — Telephone Encounter (Signed)
Sent Marylene Land a message about contacting the patient to schedule.

## 2021-05-31 NOTE — Telephone Encounter (Signed)
Pamela Mccarty messaged me back stating she will work on getting the patient scheduled.

## 2021-06-11 ENCOUNTER — Emergency Department (HOSPITAL_COMMUNITY): Payer: Medicaid Other

## 2021-06-11 ENCOUNTER — Encounter (HOSPITAL_COMMUNITY): Payer: Self-pay

## 2021-06-11 ENCOUNTER — Other Ambulatory Visit: Payer: Self-pay

## 2021-06-11 ENCOUNTER — Emergency Department (HOSPITAL_COMMUNITY)
Admission: EM | Admit: 2021-06-11 | Discharge: 2021-06-11 | Discharge status: Against Medical Advice | Payer: Medicaid Other | Attending: Emergency Medicine | Admitting: Emergency Medicine

## 2021-06-11 DIAGNOSIS — Z91010 Allergy to peanuts: Secondary | ICD-10-CM | POA: Insufficient documentation

## 2021-06-11 DIAGNOSIS — Z91040 Latex allergy status: Secondary | ICD-10-CM | POA: Insufficient documentation

## 2021-06-11 DIAGNOSIS — R079 Chest pain, unspecified: Secondary | ICD-10-CM | POA: Diagnosis present

## 2021-06-11 DIAGNOSIS — R072 Precordial pain: Secondary | ICD-10-CM | POA: Insufficient documentation

## 2021-06-11 LAB — BASIC METABOLIC PANEL
Anion gap: 9 (ref 5–15)
BUN: 16 mg/dL (ref 6–20)
CO2: 24 mmol/L (ref 22–32)
Calcium: 9.2 mg/dL (ref 8.9–10.3)
Chloride: 108 mmol/L (ref 98–111)
Creatinine, Ser: 0.75 mg/dL (ref 0.44–1.00)
GFR, Estimated: >60 mL/min (ref >60)
Glucose, Bld: 91 mg/dL (ref 70–99)
Potassium: 4 mmol/L (ref 3.5–5.1)
Sodium: 141 mmol/L (ref 135–145)

## 2021-06-11 LAB — TYPE AND SCREEN
ABO/RH(D): B POS
Antibody Screen: NEGATIVE

## 2021-06-11 LAB — TROPONIN I (HIGH SENSITIVITY)
Troponin I (High Sensitivity): <2 ng/L (ref <18)
Troponin I (High Sensitivity): <2 ng/L (ref <18)

## 2021-06-11 LAB — CBC
HCT: 45.8 % (ref 36.0–46.0)
Hemoglobin: 15.5 g/dL — ABNORMAL HIGH (ref 12.0–15.0)
MCH: 30.6 pg (ref 26.0–34.0)
MCHC: 33.8 g/dL (ref 30.0–36.0)
MCV: 90.5 fL (ref 80.0–100.0)
Platelets: 287 10*3/uL (ref 150–400)
RBC: 5.06 MIL/uL (ref 3.87–5.11)
RDW: 12.3 % (ref 11.5–15.5)
WBC: 6.4 10*3/uL (ref 4.0–10.5)
nRBC: 0 % (ref 0.0–0.2)

## 2021-06-11 LAB — D-DIMER, QUANTITATIVE: D-Dimer, Quant: 0.51 ug/mL-FEU — ABNORMAL HIGH (ref 0.00–0.50)

## 2021-06-11 LAB — PREGNANCY, URINE: Preg Test, Ur: NEGATIVE

## 2021-06-11 MED ORDER — LIDOCAINE VISCOUS HCL 2 % MT SOLN
15.0000 mL | Freq: Once | OROMUCOSAL | Status: AC
  Administered 2021-06-11: 15 mL via ORAL
  Filled 2021-06-11: qty 15

## 2021-06-11 MED ORDER — ASPIRIN 81 MG PO CHEW
162.0000 mg | CHEWABLE_TABLET | Freq: Once | ORAL | Status: AC
  Administered 2021-06-11: 162 mg via ORAL
  Filled 2021-06-11: qty 2

## 2021-06-11 MED ORDER — ALUM & MAG HYDROXIDE-SIMETH 200-200-20 MG/5ML PO SUSP
30.0000 mL | Freq: Once | ORAL | Status: AC
  Administered 2021-06-11: 30 mL via ORAL
  Filled 2021-06-11: qty 30

## 2021-06-11 NOTE — ED Notes (Signed)
Pt requesting to leave AMA. Educated on importance of staying and completing treatment, and risks of leaving. Pt verbalized understanding. NAD. Ambulatory to lobby with spouse.  ?

## 2021-06-11 NOTE — ED Triage Notes (Addendum)
POV from home.  ?Left CP since this morning. Says it feels like pressure. ?Left flank pain and  bright red emesis since yesterday. Feels like stabbing.  ? Took TWO 81mg  baby aspirin around 6pm.  ? ?

## 2021-06-11 NOTE — ED Notes (Signed)
Pt ambulatory to tx room. Pt getting urine sample. States she will call when she is finished so she can be hooked up to monitor.  ?

## 2021-06-12 NOTE — ED Provider Notes (Signed)
?[facility] EMERGENCY DEPARTMENT ?Provider Note ? ? ?CSN: [id number] ?Arrival date & time: 06/11/21  1952 ? ?  ? ?History ? ?Chief Complaint  ?Patient presents with  ? Chest Pain  ? ? ?Pamela Mccarty is a 26 y.o. female. ? ?HPI ? ?HPI: A 26 year old patient with a history of obesity presents for evaluation of chest pain. Initial onset of pain was more than 6 hours ago. The patient's chest pain is sharp and is worse with exertion. The patient complains of nausea. The patient's chest pain is middle- or left-sided, is not well-localized, is not described as heaviness/pressure/tightness and does radiate to the arms/jaw/neck. The patient denies diaphoresis. The patient has smoked in the past 90 days and has a family history of coronary artery disease in a first-degree relative with onset less than age 46. The patient has no history of stroke, has no history of peripheral artery disease, denies any history of treated diabetes, is not hypertensive and has no history of hypercholesterolemia.  ? ?Chest pain due to days, there is some pleuritic component.  Pain is constant.  She had a child few months back. ? ?Pt has no hx of PE, DVT and denies any exogenous hormone (testosterone / estrogen) use, long distance travels or surgery in the past 6 weeks, active cancer, recent immobilization. ? ? ?Home Medications ?Prior to Admission medications   ?Medication Sig Start Date End Date Taking? Authorizing Provider  ?acetaminophen (TYLENOL) 500 MG tablet Take 2 tablets (1,000 mg total) by mouth every 8 (eight) hours as needed (pain). 02/01/21   Worthy Rancher, MD  ?Blood Pressure Monitor MISC For regular home bp monitoring during pregnancy 09/18/20   Cheral Marker, CNM  ?ibuprofen (ADVIL) 600 MG tablet Take 1 tablet (600 mg total) by mouth every 6 (six) hours as needed (pain). 02/01/21   Worthy Rancher, MD  ?medroxyPROGESTERone (PROVERA) 10 MG tablet Take 1 tablet (10 mg total) by mouth daily. 03/08/21   Donette Larry, CNM  ?Prenatal MV & Min w/FA-DHA ([employer] PRENATAL GUMMY PO) Take 150 mg by mouth 2 (two) times daily.    [provider]  ?   ? ?Allergies    ?Peanut-containing drug products, Latex, Soy allergy, and Tomato   ? ?Review of Systems   ?Review of Systems  ?All other systems reviewed and are negative. ? ?Physical Exam ?Updated Vital Signs ?BP 120/65   Pulse 83   Temp 97.8 ?F (36.6 ?C) (Oral)   Resp (!) 23   Ht 5\' 7"  (1.702 m)   Wt 99.8 kg   LMP 05/19/2021 (Exact Date)   SpO2 100%   BMI 34.46 kg/m?  ?Physical Exam ?Vitals and nursing note reviewed.  ?Constitutional:   ?   Appearance: She is well-developed.  ?HENT:  ?   Head: Atraumatic.  ?Cardiovascular:  ?   Rate and Rhythm: Normal rate.  ?   Heart sounds: Normal heart sounds.  ?Pulmonary:  ?   Effort: Pulmonary effort is normal.  ?Musculoskeletal:  ?   Cervical back: Normal range of motion and neck supple.  ?Skin: ?   General: Skin is warm and dry.  ?Neurological:  ?   Mental Status: She is alert and oriented to person, place, and time.  ? ? ?ED Results / Procedures / Treatments   ?Labs ?(all labs ordered are listed, but only abnormal results are displayed) ?Labs Reviewed  ?CBC - Abnormal; Notable for the following components:  ?    Result Value  ?  Hemoglobin 15.5 (*)   ? All other components within normal limits  ?D-DIMER, QUANTITATIVE (NOT AT [facility]) - Abnormal; Notable for the following components:  ? D-Dimer, Quant 0.51 (*)   ? All other components within normal limits  ?BASIC METABOLIC PANEL  ?PREGNANCY, URINE  ?TYPE AND SCREEN  ?TROPONIN I (HIGH SENSITIVITY)  ?TROPONIN I (HIGH SENSITIVITY)  ? ? ?EKG ?EKG Interpretation ? ?Date/Time:  Monday June 11 2021 20:11:24 EST ?Ventricular Rate:  85 ?PR Interval:  120 ?QRS Duration: 102 ?QT Interval:  362 ?QTC Calculation: 430 ?R Axis:   101 ?Text Interpretation: Normal sinus rhythm Rightward axis Borderline ECG When compared with ECG of 29-Jan-2021 17:20, PREVIOUS ECG IS PRESENT No acute changes No  significant change since last tracing Confirmed by Derwood Kaplan [phone]) on 06/11/2021 9:24:45 PM ? ?Radiology ?DG Chest 2 View ? ?Result Date: 06/11/2021 ?CLINICAL DATA:  Chest pain and shortness of breath for 1 day, initial encounter EXAM: CHEST - 2 VIEW COMPARISON:  02/23/2020 FINDINGS: The heart size and mediastinal contours are within normal limits. Both lungs are clear. The visualized skeletal structures are unremarkable. IMPRESSION: No active cardiopulmonary disease. Electronically Signed   By: Alcide Clever M.D.   On: 06/11/2021 20:49   ? ?Procedures ?Procedures  ? ? ?Medications Ordered in ED ?Medications  ?aspirin chewable tablet 162 mg (162 mg Oral Given 06/11/21 2220)  ?alum & mag hydroxide-simeth (MAALOX/MYLANTA) 200-200-20 MG/5ML suspension 30 mL (30 mLs Oral Given 06/11/21 2220)  ?  And  ?lidocaine (XYLOCAINE) 2 % viscous mouth solution 15 mL (15 mLs Oral Given 06/11/21 2221)  ? ? ?ED Course/ Medical Decision Making/ A&P ?  ?HEAR Score: 3                       ?Medical Decision Making ?Amount and/or Complexity of Data Reviewed ?Labs: ordered. ?Radiology: ordered. ? ?Risk ?OTC drugs. ?Prescription drug management. ? ? ?26 year old patient comes in with chief complaint of chest pain. ? ?Patient's chest pain is left-sided, indicates that she has family history of premature CAD. ? ?Pain has some pleuritic component.  Recently gave birth. ? ?Differential diagnosis includes: ?ACS syndrome ?Aortic dissection ?CHF exacerbation ?Valvular disorder ?Myocarditis ?Endocarditis ?Pericardial effusion / tamponade ?Pneumonia ?Pleural effusion / Pulmonary edema ?PE ?Pneumothorax ?Musculoskeletal pain ?PUD / Gastritis / Esophagitis ?Esophageal spasm ? ?Appropriate labs ordered. ?Troponin x2 negative.  D-dimer was ordered, it is positive. ? ?Patient wanted to leave AMA. ?Informed her that the D-dimer is positive, she needs a CT scan to rule out a PE. ?Patient states that she has to get home because of her child care issues ?She  will return to the ER if her symptoms get worse. ? ?Patient wants to leave against medical advice. ?Patient understands that his/her actions will lead to inadequate medical workup, and that he/she is at risk of complications of missed diagnosis, which includes morbidity and mortality. ? ?Alternative options discussed -quick CT PE ?Opportunity to change mind given. ?Discussion witnessed by nurse ?Patient is demonstrating good capacity to make decision. ?Patient understands that she needs to return to the ER immediately if her symptoms get worse. ? ? ?Final Clinical Impression(s) / ED Diagnoses ?Final diagnoses:  ?Precordial chest pain  ? ? ?Rx / DC Orders ?ED Discharge Orders   ? ? None  ? ?  ? ? ?  ?Derwood Kaplan, MD ?06/12/21 2321 ? ?

## 2021-07-23 ENCOUNTER — Encounter (HOSPITAL_COMMUNITY): Payer: Self-pay | Admitting: Neurology

## 2021-07-23 ENCOUNTER — Inpatient Hospital Stay (HOSPITAL_COMMUNITY)
Admission: EM | Admit: 2021-07-23 | Discharge: 2021-07-27 | DRG: 101 | Disposition: A | Discharge status: Home / Self Care | Payer: Medicaid Other | Attending: Neurology | Admitting: Neurology

## 2021-07-23 ENCOUNTER — Other Ambulatory Visit: Payer: Self-pay

## 2021-07-23 ENCOUNTER — Inpatient Hospital Stay (HOSPITAL_COMMUNITY): Payer: Medicaid Other

## 2021-07-23 DIAGNOSIS — Z79890 Hormone replacement therapy (postmenopausal): Secondary | ICD-10-CM

## 2021-07-23 DIAGNOSIS — R569 Unspecified convulsions: Principal | ICD-10-CM

## 2021-07-23 DIAGNOSIS — F419 Anxiety disorder, unspecified: Secondary | ICD-10-CM | POA: Diagnosis present

## 2021-07-23 DIAGNOSIS — Z79899 Other long term (current) drug therapy: Secondary | ICD-10-CM | POA: Diagnosis not present

## 2021-07-23 DIAGNOSIS — R519 Headache, unspecified: Secondary | ICD-10-CM | POA: Diagnosis present

## 2021-07-23 DIAGNOSIS — Z23 Encounter for immunization: Secondary | ICD-10-CM

## 2021-07-23 DIAGNOSIS — F32A Depression, unspecified: Secondary | ICD-10-CM | POA: Diagnosis present

## 2021-07-23 DIAGNOSIS — Z818 Family history of other mental and behavioral disorders: Secondary | ICD-10-CM | POA: Diagnosis not present

## 2021-07-23 DIAGNOSIS — F909 Attention-deficit hyperactivity disorder, unspecified type: Secondary | ICD-10-CM | POA: Diagnosis present

## 2021-07-23 DIAGNOSIS — Z87891 Personal history of nicotine dependence: Secondary | ICD-10-CM

## 2021-07-23 DIAGNOSIS — F445 Conversion disorder with seizures or convulsions: Secondary | ICD-10-CM | POA: Diagnosis not present

## 2021-07-23 DIAGNOSIS — Z820 Family history of epilepsy and other diseases of the nervous system: Secondary | ICD-10-CM | POA: Diagnosis not present

## 2021-07-23 LAB — CBC WITH DIFFERENTIAL/PLATELET
Abs Immature Granulocytes: 0.02 10*3/uL (ref 0.00–0.07)
Basophils Absolute: 0 10*3/uL (ref 0.0–0.1)
Basophils Relative: 1 %
Eosinophils Absolute: 0.1 10*3/uL (ref 0.0–0.5)
Eosinophils Relative: 2 %
HCT: 41.6 % (ref 36.0–46.0)
Hemoglobin: 14.5 g/dL (ref 12.0–15.0)
Immature Granulocytes: 0 %
Lymphocytes Relative: 35 %
Lymphs Abs: 2.1 10*3/uL (ref 0.7–4.0)
MCH: 30.3 pg (ref 26.0–34.0)
MCHC: 34.9 g/dL (ref 30.0–36.0)
MCV: 86.8 fL (ref 80.0–100.0)
Monocytes Absolute: 0.6 10*3/uL (ref 0.1–1.0)
Monocytes Relative: 10 %
Neutro Abs: 3.2 10*3/uL (ref 1.7–7.7)
Neutrophils Relative %: 52 %
Platelets: 238 10*3/uL (ref 150–400)
RBC: 4.79 MIL/uL (ref 3.87–5.11)
RDW: 11.9 % (ref 11.5–15.5)
WBC: 6.2 10*3/uL (ref 4.0–10.5)
nRBC: 0 % (ref 0.0–0.2)

## 2021-07-23 LAB — COMPREHENSIVE METABOLIC PANEL
ALT: 19 U/L (ref 0–44)
AST: 18 U/L (ref 15–41)
Albumin: 3.5 g/dL (ref 3.5–5.0)
Alkaline Phosphatase: 64 U/L (ref 38–126)
Anion gap: 3 — ABNORMAL LOW (ref 5–15)
BUN: 12 mg/dL (ref 6–20)
CO2: 25 mmol/L (ref 22–32)
Calcium: 8.9 mg/dL (ref 8.9–10.3)
Chloride: 111 mmol/L (ref 98–111)
Creatinine, Ser: 0.79 mg/dL (ref 0.44–1.00)
GFR, Estimated: >60 mL/min (ref >60)
Glucose, Bld: 100 mg/dL — ABNORMAL HIGH (ref 70–99)
Potassium: 3.8 mmol/L (ref 3.5–5.1)
Sodium: 139 mmol/L (ref 135–145)
Total Bilirubin: 0.4 mg/dL (ref 0.3–1.2)
Total Protein: 6.3 g/dL — ABNORMAL LOW (ref 6.5–8.1)

## 2021-07-23 LAB — RAPID URINE DRUG SCREEN, HOSP PERFORMED
Amphetamines: NOT DETECTED
Barbiturates: NOT DETECTED
Benzodiazepines: NOT DETECTED
Cocaine: NOT DETECTED
Opiates: NOT DETECTED
Tetrahydrocannabinol: NOT DETECTED

## 2021-07-23 LAB — MAGNESIUM: Magnesium: 1.9 mg/dL (ref 1.7–2.4)

## 2021-07-23 LAB — PHOSPHORUS: Phosphorus: 3.5 mg/dL (ref 2.5–4.6)

## 2021-07-23 LAB — PROTIME-INR
INR: 1 (ref 0.8–1.2)
Prothrombin Time: 13.2 seconds (ref 11.4–15.2)

## 2021-07-23 LAB — PREGNANCY, URINE: Preg Test, Ur: NEGATIVE

## 2021-07-23 MED ORDER — LORAZEPAM 2 MG/ML IJ SOLN: 0.5000 mg | INTRAMUSCULAR | Status: DC | PRN

## 2021-07-23 MED ORDER — LABETALOL HCL 5 MG/ML IV SOLN: 5.0000 mg | INTRAVENOUS | Status: DC | PRN

## 2021-07-23 MED ORDER — LORAZEPAM 2 MG/ML IJ SOLN
2.0000 mg | INTRAMUSCULAR | Status: DC | PRN
Start: 2021-07-23 — End: 2021-07-23

## 2021-07-23 MED ORDER — ENOXAPARIN SODIUM 40 MG/0.4ML IJ SOSY: 40.0000 mg | PREFILLED_SYRINGE | INTRAMUSCULAR | Status: DC

## 2021-07-23 MED ORDER — PNEUMOCOCCAL 20-VAL CONJ VACC 0.5 ML IM SUSY
0.5000 mL | PREFILLED_SYRINGE | INTRAMUSCULAR | Status: AC
  Administered 2021-07-25: 0.5 mL via INTRAMUSCULAR
  Filled 2021-07-23 (×2): qty 0.5

## 2021-07-23 NOTE — H&P (Addendum)
CC: seizure ? ?History is obtained from: Patient, chart review ? ?HPI: Pamela Mccarty is a 26 y.o. ambidextrous female with past medical history of manic depression (not on any medications) seizure-like episodes who was admitted to epilepsy monitoring unit for characterization of spells. ? ?History of spells: Patient states she has had these episodes since he was about 65 or 56-year-old.  States before the episode she feels lightheaded dizzy and had a headache followed by whole body shaking, has had left-sided tongue bite at times as well as bladder incontinence a few times.  States episodes can last from few minutes to up to an hour.  Sometimes they happen every 3 to 4 days.  However last episode was about 4 months ago couple of days after she delivered her baby.  Episodes are triggered by stress.  States she is not on any AEDs and just goes to ER but these episodes happen. ? ?Epilepsy risk factors: States she lost her mother when she was 75 and is not sure if because she had a normal delivery, denies febrile seizures. States her father and 2 siblings all have epilepsy, reports having head injury when she was about 80 and half years old due to fall. ? ?Routine EEG 04/12/2021: Normal awake and drowsy state. No evidence of interictal epileptiform discharges seen. A normal EEG does not exclude a diagnosis of epilepsy.  ? ?CT head without contrast 07/30/2019: Normal unenhanced CT of the brain. ?  ?  ?ROS: All other systems reviewed and negative except as noted in the HPI.  ? ?Past Medical History:  ?Diagnosis Date  ? ADHD (attention deficit hyperactivity disorder)   ? Anxiety   ? Asthma   ? inhaler as a child  ? Depression   ? Epilepsy (HCC)   ? Headache(784.0)   ? Obesity   ? Seizures (HCC)   ? last seizure 08/29/19  ? Seizures (HCC) 02/02/2021  ? per pt; 2/2 post partum blood loss  ? ? ?Family History  ?Problem Relation Age of Onset  ? Bipolar disorder Mother   ? Cancer Mother   ? Diabetes Mother   ? Hypertension  Mother   ? Seizures Mother   ? Hypertension Father   ? Stroke Father   ? Seizures Father   ? Seizures Brother   ? Heart disease Maternal Grandfather   ? Heart attack Maternal Grandfather   ? ? ?Social History:  reports that she quit smoking about 5 years ago. Her smoking use included cigarettes. She smoked an average of .1 packs per day. She has never used smokeless tobacco. She reports that she does not currently use drugs after having used the following drugs: Marijuana. Frequency: 7.00 times per week. She reports that she does not drink alcohol. ? ? ?Medications Prior to Admission  ?Medication Sig Dispense Refill Last Dose  ? acetaminophen (TYLENOL) 500 MG tablet Take 2 tablets (1,000 mg total) by mouth every 8 (eight) hours as needed (pain). 60 tablet 0   ? Blood Pressure Monitor MISC For regular home bp monitoring during pregnancy 1 each 0   ? ibuprofen (ADVIL) 600 MG tablet Take 1 tablet (600 mg total) by mouth every 6 (six) hours as needed (pain). 40 tablet 0   ? medroxyPROGESTERone (PROVERA) 10 MG tablet Take 1 tablet (10 mg total) by mouth daily. 10 tablet 0   ? Prenatal MV & Min w/FA-DHA ([employer] PRENATAL GUMMY PO) Take 150 mg by mouth 2 (two) times daily.     ?  ? ? ?  Exam: ?Current vital signs: ?BP 125/88   Pulse 80   Resp 16   Ht 5\' 6"  (1.676 m)   Wt 115 kg   BMI 40.92 kg/m?  ?Vital signs in last 24 hours: ?Pulse Rate:  [80] 80 (04/17 0807) ?Resp:  [16] 16 (04/17 0807) ?BP: (125)/(88) 125/88 (04/17 07-05-1999) ?Weight:  3710 kg] 115 kg (04/17 0807) ? ? ?Physical Exam  ?Constitutional: Appears well-developed and well-nourished.  ?Psych: Affect appropriate to situation ?Eyes: No scleral injection ?HENT: No OP obstrucion ?Skin: Warm ?Neuro: AOx3, cranial nerves 2-12 grossly intact, 5/5 in all 4 extremities ? ? ?I have reviewed labs in epic and the results pertinent to this consultation are: ?CBC:  ?Recent Labs  ?Lab 07/23/21 ?0819  ?WBC 6.2  ?NEUTROABS 3.2  ?HGB 14.5  ?HCT 41.6  ?MCV 86.8  ?PLT 238  ? ? ?Basic  Metabolic Panel:  ?Lab Results  ?Component Value Date  ? NA 139 07/23/2021  ? K 3.8 07/23/2021  ? CO2 25 07/23/2021  ? GLUCOSE 100 (H) 07/23/2021  ? BUN 12 07/23/2021  ? CREATININE 0.79 07/23/2021  ? CALCIUM 8.9 07/23/2021  ? GFRNONAA >60 07/23/2021  ? GFRAA >60 11/02/2019  ? ?Lipid Panel: No results found for: LDLCALC ?HgbA1c:  ?Lab Results  ?Component Value Date  ? HGBA1C 4.8 09/18/2020  ? ?Urine Drug Screen:  ?   ?Component Value Date/Time  ? LABOPIA NONE DETECTED 01/29/2021 2114  ? COCAINSCRNUR NONE DETECTED 01/29/2021 2114  ? LABBENZ NONE DETECTED 01/29/2021 2114  ? AMPHETMU NONE DETECTED 01/29/2021 2114  ? THCU POSITIVE (A) 01/29/2021 2114  ? LABBARB NONE DETECTED 01/29/2021 2114  ?  ?Alcohol Level  ?   ?Component Value Date/Time  ? ETH <5 12/08/2015 2209  ? ? ? ?I have reviewed the images obtained ? ?CT head without contrast 07/30/2019: Normal unenhanced CT of the brain. ? ?ASSESSMENT/PLAN: 27 year old female admitted to epilepsy monitoring unit for characterization of seizures episodes. ? ?Seizure ?-Will start video EEG monitoring for characterization of spells ?-Patient not currently on any AEDs ?-Continue seizure precautions ?-As needed IV Ativan 0.5 mg for clinical seizure-like activity ? ?30 ?Epilepsy ?[facility] ? ? ? ? ?

## 2021-07-23 NOTE — Progress Notes (Signed)
LTM EEG hooked up and running - no initial skin breakdown - push button tested - neuro notified. [facility] monitoring.  

## 2021-07-23 NOTE — Progress Notes (Signed)
?  Transition of Care (TOC) Screening Note ? ? ?Patient Details  ?Name: Pamela Mccarty ?Date of Birth: May 04, 1995 ? ? ?Transition of Care (TOC) CM/SW Contact:    ?Kermit Balo, RN ?Phone Number: ?07/23/2021, 11:34 AM ? ? ? ?Transition of Care Department [facility]) has reviewed patient and no TOC needs have been identified at this time. We will continue to monitor patient advancement through interdisciplinary progression rounds. If new patient transition needs arise, please place a TOC consult. ?  ?

## 2021-07-24 DIAGNOSIS — R569 Unspecified convulsions: Secondary | ICD-10-CM | POA: Diagnosis not present

## 2021-07-24 LAB — LEVETIRACETAM LEVEL: Levetiracetam Lvl: <2 ug/mL — ABNORMAL LOW (ref 10.0–40.0)

## 2021-07-24 MED ORDER — KETOROLAC TROMETHAMINE 15 MG/ML IJ SOLN
15.0000 mg | Freq: Three times a day (TID) | INTRAMUSCULAR | Status: DC | PRN
  Administered 2021-07-24 – 2021-07-27 (×7): 15 mg via INTRAVENOUS
  Filled 2021-07-24 (×9): qty 1

## 2021-07-24 NOTE — Progress Notes (Signed)
EEG maint complete.  ?

## 2021-07-24 NOTE — Progress Notes (Signed)
Subjective: No events overnight.  Reports having some headache. ? ?ROS: negative except above ? ?Examination ? ?Vital signs in last 24 hours: ?Temp:  [98 ?F (36.7 ?C)-98.8 ?F (37.1 ?C)] 98 ?F (36.7 ?C) (04/18 6063) ?Pulse Rate:  [72-87] 78 (04/18 0923) ?Resp:  [10-22] 10 (04/18 0160) ?BP: (120-144)/(71-96) 120/71 (04/18 1093) ?SpO2:  [94 %-99 %] 94 % (04/18 0923) ? ?General: lying in bed, NAD ?CVS: pulse-normal rate and rhythm ?RS: breathing comfortably ?Extremities: normal  ? ?Neuro: ?MS: Alert, oriented, follows commands ?CN: pupils equal and reactive,  EOMI, face symmetric, tongue midline, normal sensation over face, ?Motor: 5/5 strength in all 4 extremities ? ?Basic Metabolic Panel: ?Recent Labs  ?Lab 07/23/21 ?0819  ?NA 139  ?K 3.8  ?CL 111  ?CO2 25  ?GLUCOSE 100*  ?BUN 12  ?CREATININE 0.79  ?CALCIUM 8.9  ?MG 1.9  ?PHOS 3.5  ? ? ?CBC: ?Recent Labs  ?Lab 07/23/21 ?0819  ?WBC 6.2  ?NEUTROABS 3.2  ?HGB 14.5  ?HCT 41.6  ?MCV 86.8  ?PLT 238  ? ? ? ?Coagulation Studies: ?Recent Labs  ?  07/23/21 ?0819  ?LABPROT 13.2  ?INR 1.0  ? ? ?Imaging ?No new brain imaging overnight ? ?ASSESSMENT AND PLAN: 26 year old female admitted to epilepsy monitoring unit for characterization of seizures episodes. ?  ?Seizure ?-Continue video EEG monitoring for characterization of spells ?-Patient not currently on any AEDs ?-Sleep deprivation tonight- ?- Will attempt HV and photic stimulation today depending on tech ability ?-Continue seizure precautions ?-As needed IV Ativan 0.5 mg for clinical seizure-like activity ?-Discussed overnight EEG findings with patient and husband at bedside ? ?Headache ?- as needed Tylenol and Toradol ? ?I have spent a total of  38  minutes with the patient reviewing hospital notes,  test results, labs and examining the patient as well as establishing an assessment and plan that was discussed personally with the patient.  > 50% of time was spent in direct patient care. ?  ?  ?Lindie Spruce ?Epilepsy ?[facility] ?For questions after 5pm please refer to AMION to reach the Neurologist on call ? ?

## 2021-07-24 NOTE — Procedures (Signed)
Patient Name: Pamela Mccarty  ?MRN: [id number]  ?Epilepsy Attending: Lora Havens  ?Referring Physician/Provider: Lora Havens, MD ?Duration: 07/23/2021 0845 to 07/24/2021 0845 ? ?Patient history: 26 year old female admitted to epilepsy monitoring unit for characterization of seizures episodes. ? ?Level of alertness: Awake, asleep ? ?AEDs during EEG study: None ? ?Technical aspects: This EEG study was done with scalp electrodes positioned according to the 10-20 International system of electrode placement. Electrical activity was acquired at a sampling rate of 500Hz  and reviewed with a high frequency filter of 70Hz  and a low frequency filter of 1Hz . EEG data were recorded continuously and digitally stored.  ? ?Description: The posterior dominant rhythm consists of 9 Hz activity of moderate voltage (25-35 uV) seen predominantly in posterior head regions, symmetric and reactive to eye opening and eye closing. Sleep was characterized by vertex waves, sleep spindles (12 to 14 Hz), maximal frontocentral region. Hyperventilation and photic stimulation were not performed.    ? ?IMPRESSION: ?This study is within normal limits. No seizures or epileptiform discharges were seen throughout the recording. ? ?Lora Havens  ? ?

## 2021-07-25 DIAGNOSIS — R569 Unspecified convulsions: Secondary | ICD-10-CM | POA: Diagnosis not present

## 2021-07-25 NOTE — Procedures (Signed)
Patient Name: Pamela Mccarty  ?MRN: [id number]  ?Epilepsy Attending: Charlsie Quest  ?Referring Physician/Provider: Charlsie Quest, MD ?Duration: 07/24/2021 0845 to 07/25/2021 0845 ?  ?Patient history: 26 year old female admitted to epilepsy monitoring unit for characterization of seizures episodes. ?  ?Level of alertness: Awake, asleep ?  ?AEDs during EEG study: None ?  ?Technical aspects: This EEG study was done with scalp electrodes positioned according to the 10-20 International system of electrode placement. Electrical activity was acquired at a sampling rate of 500Hz  and reviewed with a high frequency filter of 70Hz  and a low frequency filter of 1Hz . EEG data were recorded continuously and digitally stored.  ?  ?Description: The posterior dominant rhythm consists of 9 Hz activity of moderate voltage (25-35 uV) seen predominantly in posterior head regions, symmetric and reactive to eye opening and eye closing. Sleep was characterized by vertex waves, sleep spindles (12 to 14 Hz), maximal frontocentral region. Hyperventilation and photic stimulation were not performed.    ?  ?IMPRESSION: ?This study is within normal limits. No seizures or epileptiform discharges were seen throughout the recording. ?  ?  ?

## 2021-07-25 NOTE — Progress Notes (Signed)
LTM maint complete - no skin breakdown under:  FP1 FP2 A1 A2 

## 2021-07-25 NOTE — Progress Notes (Signed)
Subjective: No events overnight.  ? ?ROS: negative except above ? ?Examination ? ?Vital signs in last 24 hours: ?Temp:  [97.5 ?F (36.4 ?C)-98.2 ?F (36.8 ?C)] 98.2 ?F (36.8 ?C) (04/19 0756) ?Pulse Rate:  [89-97] 93 (04/19 0334) ?Resp:  [16] 16 (04/19 0334) ?BP: (111-126)/(70-82) 111/70 (04/19 0334) ?SpO2:  [89 %-97 %] 97 % (04/19 0800) ? ?General: lying in bed, NAD ?CVS: pulse-normal rate and rhythm ?RS: breathing comfortably ?Extremities: normal  ?  ?Neuro: ?MS: Alert, oriented, follows commands ?CN: pupils equal and reactive,  EOMI, face symmetric, tongue midline, normal sensation over face, ?Motor: 5/5 strength in all 4 extremities ? ?Basic Metabolic Panel: ?Recent Labs  ?Lab 07/23/21 ?0819  ?NA 139  ?K 3.8  ?CL 111  ?CO2 25  ?GLUCOSE 100*  ?BUN 12  ?CREATININE 0.79  ?CALCIUM 8.9  ?MG 1.9  ?PHOS 3.5  ? ? ?CBC: ?Recent Labs  ?Lab 07/23/21 ?0819  ?WBC 6.2  ?NEUTROABS 3.2  ?HGB 14.5  ?HCT 41.6  ?MCV 86.8  ?PLT 238  ? ? ? ?Coagulation Studies: ?Recent Labs  ?  07/23/21 ?0819  ?LABPROT 13.2  ?INR 1.0  ? ? ?Imaging ?No new brain imaging overnight ?  ?ASSESSMENT AND PLAN: 26 year old female admitted to epilepsy monitoring unit for characterization of seizures episodes. ?  ?Seizure ?-Continue video EEG monitoring for characterization of spells ?-Patient not currently on any AEDs ?- HV and photic stimulation today  ?-Continue seizure precautions ?-As needed IV Ativan 0.5 mg for clinical seizure-like activity ?-Discussed overnight EEG findings with patient  ?  ?  ?I have spent a total of  27 minutes with the patient reviewing hospital notes,  test results, labs and examining the patient as well as establishing an assessment and plan that was discussed personally with the patient.  > 50% of time was spent in direct patient care. ? ? ?Lindie Spruce ?Epilepsy ?[facility] ?For questions after 5pm please refer to AMION to reach the Neurologist on call ? ?

## 2021-07-26 DIAGNOSIS — R569 Unspecified convulsions: Secondary | ICD-10-CM | POA: Diagnosis not present

## 2021-07-26 NOTE — Plan of Care (Signed)

## 2021-07-26 NOTE — Progress Notes (Signed)
EMU maint complete - no skin breakdown under:Fp1 F3 F4 A1 A2 ?[facility] monitored ? ?

## 2021-07-26 NOTE — Progress Notes (Signed)
Subjective: NAEO.  ? ?ROS: negative except above ? ?Examination ? ?Vital signs in last 24 hours: ?Temp:  [97.6 ?F (36.4 ?C)-98.7 ?F (37.1 ?C)] 98.7 ?F (37.1 ?C) (04/20 1218) ?Pulse Rate:  [81-89] 89 (04/20 1218) ?Resp:  [17-23] 21 (04/20 1218) ?BP: (117-131)/(66-81) 123/81 (04/20 [id number]) ?SpO2:  [97 %-100 %] 98 % (04/20 1218) ? ?General: lying in bed, NAD ?CVS: pulse-normal rate and rhythm ?RS: breathing comfortably ?Extremities: normal  ?  ?Neuro: ?MS: Alert, oriented, follows commands ?CN: pupils equal and reactive,  EOMI, face symmetric, tongue midline, normal sensation over face, ?Motor: 5/5 strength in all 4 extremities ? ?Basic Metabolic Panel: ?Recent Labs  ?Lab 07/23/21 ?0819  ?NA 139  ?K 3.8  ?CL 111  ?CO2 25  ?GLUCOSE 100*  ?BUN 12  ?CREATININE 0.79  ?CALCIUM 8.9  ?MG 1.9  ?PHOS 3.5  ? ? ?CBC: ?Recent Labs  ?Lab 07/23/21 ?0819  ?WBC 6.2  ?NEUTROABS 3.2  ?HGB 14.5  ?HCT 41.6  ?MCV 86.8  ?PLT 238  ? ? ? ?Coagulation Studies: ?No results for input(s): LABPROT, INR in the last 72 hours. ? ?Imaging ?No new brain imaging overnight ?  ?ASSESSMENT AND PLAN: 26 year old female admitted to epilepsy monitoring unit for characterization of seizures episodes. ?  ?Seizure ?-Continue video EEG monitoring for characterization of spells ?-Patient not currently on any AEDs ?-Continue seizure precautions ?-As needed IV Ativan 0.5 mg for clinical seizure-like activity ?-Discussed overnight EEG findings with patient  ?  ?I have spent a total of  26 minutes with the patient reviewing hospital notes,  test results, labs and examining the patient as well as establishing an assessment and plan that was discussed personally with the patient and husband at bedside.  > 50% of time was spent in direct patient care. ? ? ?Zeb Comfort ?Epilepsy ?[facility] ?For questions after 5pm please refer to AMION to reach the Neurologist on call ? ?

## 2021-07-26 NOTE — Plan of Care (Signed)
?Problem: Education: ?Goal: Knowledge of General Education information will improve ?Description: Including pain rating scale, medication(s)/side effects and non-pharmacologic comfort measures ?07/26/2021 1114 by Deliah Boston, RN ?Outcome: Progressing ?07/26/2021 1114 by Deliah Boston, RN ?Outcome: Progressing ?  ?Problem: Health Behavior/Discharge Planning: ?Goal: Ability to manage health-related needs will improve ?07/26/2021 1114 by Deliah Boston, RN ?Outcome: Progressing ?07/26/2021 1114 by Deliah Boston, RN ?Outcome: Progressing ?  ?Problem: Clinical Measurements: ?Goal: Ability to maintain clinical measurements within normal limits will improve ?07/26/2021 1114 by Deliah Boston, RN ?Outcome: Progressing ?07/26/2021 1114 by Deliah Boston, RN ?Outcome: Progressing ?Goal: Will remain free from infection ?07/26/2021 1114 by Deliah Boston, RN ?Outcome: Progressing ?07/26/2021 1114 by Deliah Boston, RN ?Outcome: Progressing ?Goal: Diagnostic test results will improve ?07/26/2021 1114 by Deliah Boston, RN ?Outcome: Progressing ?07/26/2021 1114 by Deliah Boston, RN ?Outcome: Progressing ?Goal: Respiratory complications will improve ?07/26/2021 1114 by Deliah Boston, RN ?Outcome: Progressing ?07/26/2021 1114 by Deliah Boston, RN ?Outcome: Progressing ?Goal: Cardiovascular complication will be avoided ?07/26/2021 1114 by Deliah Boston, RN ?Outcome: Progressing ?07/26/2021 1114 by Deliah Boston, RN ?Outcome: Progressing ?  ?Problem: Activity: ?Goal: Risk for activity intolerance will decrease ?07/26/2021 1114 by Deliah Boston, RN ?Outcome: Progressing ?07/26/2021 1114 by Deliah Boston, RN ?Outcome: Progressing ?  ?Problem: Nutrition: ?Goal: Adequate nutrition will be maintained ?07/26/2021 1114 by Deliah Boston, RN ?Outcome: Progressing ?07/26/2021 1114 by Deliah Boston, RN ?Outcome: Progressing ?  ?Problem: Coping: ?Goal: Level of anxiety will decrease ?07/26/2021 1114 by Deliah Boston, RN ?Outcome: Progressing ?07/26/2021 1114 by Deliah Boston, RN ?Outcome: Progressing ?  ?Problem: Elimination: ?Goal: Will not experience complications related to bowel motility ?07/26/2021 1114 by Deliah Boston, RN ?Outcome: Progressing ?07/26/2021 1114 by Deliah Boston, RN ?Outcome: Progressing ?Goal: Will not experience complications related to urinary retention ?07/26/2021 1114 by Deliah Boston, RN ?Outcome: Progressing ?07/26/2021 1114 by Deliah Boston, RN ?Outcome: Progressing ?  ?Problem: Pain Managment: ?Goal: General experience of comfort will improve ?07/26/2021 1114 by Deliah Boston, RN ?Outcome: Progressing ?07/26/2021 1114 by Deliah Boston, RN ?Outcome: Progressing ?  ?Problem: Safety: ?Goal: Ability to remain free from injury will improve ?07/26/2021 1114 by Deliah Boston, RN ?Outcome: Progressing ?07/26/2021 1114 by Deliah Boston, RN ?Outcome: Progressing ?  ?Problem: Skin Integrity: ?Goal: Risk for impaired skin integrity will decrease ?07/26/2021 1114 by Deliah Boston, RN ?Outcome: Progressing ?07/26/2021 1114 by Deliah Boston, RN ?Outcome: Progressing ?  ?Problem: Education: ?Goal: Expressions of having a comfortable level of knowledge regarding the disease process will increase ?07/26/2021 1114 by Deliah Boston, RN ?Outcome: Progressing ?07/26/2021 1114 by Deliah Boston, RN ?Outcome: Progressing ?  ?Problem: Coping: ?Goal: Ability to adjust to condition or change in health will improve ?07/26/2021 1114 by Deliah Boston, RN ?Outcome: Progressing ?07/26/2021 1114 by Deliah Boston, RN ?Outcome: Progressing ?Goal: Ability to identify appropriate support needs will improve ?07/26/2021 1114 by Deliah Boston, RN ?Outcome: Progressing ?07/26/2021 1114 by Deliah Boston, RN ?Outcome: Progressing ?  ?Problem: Health Behavior/Discharge Planning: ?Goal: Compliance with prescribed medication regimen will improve ?07/26/2021 1114 by Deliah Boston, RN ?Outcome:  Progressing ?07/26/2021 1114 by Deliah Boston, RN ?Outcome: Progressing ?  ?Problem: Medication: ?Goal: Risk for medication side effects will decrease ?07/26/2021 1114 by Deliah Boston, RN ?Outcome: Progressing ?07/26/2021 1114 by Deliah Boston, RN ?Outcome: Progressing ?  ?Problem:  Clinical Measurements: ?Goal: Complications related to the disease process, condition or treatment will be avoided or minimized ?07/26/2021 1114 by Deliah Boston, RN ?Outcome: Progressing ?07/26/2021 1114 by Deliah Boston, RN ?Outcome: Progressing ?Goal: Diagnostic test results will improve ?07/26/2021 1114 by Deliah Boston, RN ?Outcome: Progressing ?07/26/2021 1114 by Deliah Boston, RN ?Outcome: Progressing ?  ?Problem: Safety: ?Goal: Verbalization of understanding the information provided will improve ?07/26/2021 1114 by Deliah Boston, RN ?Outcome: Progressing ?07/26/2021 1114 by Deliah Boston, RN ?Outcome: Progressing ?  ?Problem: Self-Concept: ?Goal: Level of anxiety will decrease ?07/26/2021 1114 by Deliah Boston, RN ?Outcome: Progressing ?07/26/2021 1114 by Deliah Boston, RN ?Outcome: Progressing ?Goal: Ability to verbalize feelings about condition will improve ?07/26/2021 1114 by Deliah Boston, RN ?Outcome: Progressing ?07/26/2021 1114 by Deliah Boston, RN ?Outcome: Progressing ?  ?

## 2021-07-26 NOTE — Procedures (Signed)
Patient Name: Pamela Mccarty  ?MRN: [id number]  ?Epilepsy Attending: Charlsie Quest  ?Referring Physician/Provider: Charlsie Quest, MD ?Duration: 07/25/2021 0845 to 07/26/2021 0845 ?  ?Patient history: 26 year old female admitted to epilepsy monitoring unit for characterization of seizures episodes. ?  ?Level of alertness: Awake, asleep ?  ?AEDs during EEG study: None ?  ?Technical aspects: This EEG study was done with scalp electrodes positioned according to the 10-20 International system of electrode placement. Electrical activity was acquired at a sampling rate of 500Hz  and reviewed with a high frequency filter of 70Hz  and a low frequency filter of 1Hz . EEG data were recorded continuously and digitally stored.  ?  ?Description: The posterior dominant rhythm consists of 9 Hz activity of moderate voltage (25-35 uV) seen predominantly in posterior head regions, symmetric and reactive to eye opening and eye closing. Sleep was characterized by vertex waves, sleep spindles (12 to 14 Hz), maximal frontocentral region.  Physiologic photic driving was seen during photic stimulation.  No EEG change was seen during hyperventilation. ?  ?IMPRESSION: ?This study is within normal limits. No seizures or epileptiform discharges were seen throughout the recording. ?  ?  ?

## 2021-07-27 ENCOUNTER — Encounter: Payer: Self-pay | Admitting: Neurology

## 2021-07-27 DIAGNOSIS — F445 Conversion disorder with seizures or convulsions: Secondary | ICD-10-CM

## 2021-07-27 DIAGNOSIS — R569 Unspecified convulsions: Secondary | ICD-10-CM | POA: Diagnosis not present

## 2021-07-27 NOTE — Progress Notes (Signed)
Admitted from home due to increased seizure activity. Followed and participated in prescribed treatment/care plan with goals being completed. Tele/heart monitoring discontinued and removed. 22G IV site to right forearm discontinued, IV removed tip intact and with minimal bleeding, guaze dressing applied and secure with tape, no s/s of infection.  Discharged to home via car transport with husband at her side. ?

## 2021-07-27 NOTE — TOC Transition Note (Signed)
Transition of Care (TOC) - CM/SW Discharge Note ? ? ?Patient Details  ?Name: Pamela Mccarty ?MRN: [id number] ?Date of Birth: 08-28-1995 ? ?Transition of Care (TOC) CM/SW Contact:  ?Kermit Balo, RN ?Phone Number: ?07/27/2021, 10:27 AM ? ? ?Clinical Narrative:    ?Patient is discharging home with self care.  ?Pt without a PCP. Pt interested in assistance obtaining a PCP. CM was able to obtain an appointment and placed it on the AVS.  ?Pt has transportation home. ? ? ?Final next level of care: Home/Self Care ?Barriers to Discharge: No Barriers Identified ? ? ?Patient Goals and CMS Choice ?  ?  ?  ? ?Discharge Placement ?  ?           ?  ?  ?  ?  ? ?Discharge Plan and Services ?  ?  ?           ?  ?  ?  ?  ?  ?  ?  ?  ?  ?  ? ?Social Determinants of Health (SDOH) Interventions ?  ? ? ?Readmission Risk Interventions ?   ? View : No data to display.  ?  ?  ?  ? ? ? ? ? ?

## 2021-07-27 NOTE — Progress Notes (Signed)
?  ?  ? ?  Date: 07/27/2021 ?  ?Lindie Spruce ?[address] ?[place] ?[place] [address] ?  ?Subject: Leave from work ?  ?To whom it may concern,  ?  ?Ms Eldoris Beiser was admitted to [facility] for workup. She was at the hospital from 07/23/2021 to 07/27/2021.  Patient's significant other Mr. Elouise Munroe was requested to stay at the hospital with patient. ? ?Please do not hesitate to contact me for any questions. ?  ?Dr Lindie Spruce ?  ?  ?  ? ?

## 2021-07-27 NOTE — Progress Notes (Signed)
LTM EEG discontinued - no skin breakdown at unhook.   

## 2021-07-27 NOTE — Discharge Summary (Addendum)
Physician Discharge Summary  ?Patient ID: ?Pamela Mccarty ?MRN: [id number] ?DOB/AGE: [birth year] 26 y.o. ? ?Admit date: 07/23/2021 ?Discharge date: 07/27/2021 ? ?Admission Diagnoses: Seizure ? ?Discharge Diagnoses: Convulsions, nonepileptic events/pseudoseizures ? ?Discharged Condition: stable ? ?Hospital Course: Pamela Mccarty was admitted to epilepsy monitoring unit from 07/23/2021 to 07/27/2021.  During this time, she underwent continuous video EEG monitoring.  Hyperventilation, photic stimulation, sleep deprivation were performed for seizure provocation.  No typical events were captured.  EEG was within normal limits. ? 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  Additionally, patient reports having these episodes since the age of 7 and has never been on AEDs.  I discussed starting medication like lamotrigine which can help with epileptic and nonepileptic events.  However patient would like to hold off on starting any medications for now.  Also discussed the importance of cognitive behavioral therapy.  Seizure precautions including do not drive were also discussed. ? ?MRI brain is pending and is being done as an outpatient. ? ?Consults: None ? ?Significant Diagnostic Studies: Video EEG ? ?Description: The posterior dominant rhythm consists of 9 Hz activity of moderate voltage (25-35 uV) seen predominantly in posterior head regions, symmetric and reactive to eye opening and eye closing. Sleep was characterized by vertex waves, sleep spindles (12 to 14 Hz), maximal frontocentral region.  Physiologic photic driving was seen during photic stimulation.  No EEG change was seen during hyperventilation. ?  ?IMPRESSION: ?This study is within normal limits. No seizures or epileptiform discharges were seen throughout the recording. ?  ?Treatments: No new medications ? ?Discharge Exam: ?Blood pressure 124/71,  pulse 82, temperature 98 ?F (36.7 ?C), temperature source Oral, resp. rate 20, height 5\' 6"  (1.676 m), weight 115 kg, SpO2 98 %, not currently breastfeeding. ?General: lying in bed, NAD ?CVS: pulse-normal rate and rhythm ?RS: breathing comfortably ?Extremities: normal  ?  ?Neuro: ?MS: Alert, oriented, follows commands ?CN: pupils equal and reactive,  EOMI, face symmetric, tongue midline, normal sensation over face, ?Motor: 5/5 strength in all 4 extremities ? ?Disposition: Home ? ? ?Allergies as of 07/27/2021   ? ?   Reactions  ? Peanut-containing Drug Products Anaphylaxis  ? Stomach bubbles with Peanut oil only ?(Patient states that she is not allergic to this)  ? Latex Other (See Comments)  ? Redness at site  ? Soy Allergy Hives  ? Tomato   ? Throat swelling, shortness of breath  ? ?  ? ?  ?Medication List  ?  ? ?TAKE these medications   ? ?acetaminophen 500 MG tablet ?Commonly known as: TYLENOL ?Take 2 tablets (1,000 mg total) by mouth every 8 (eight) hours as needed (pain). ?  ?[employer] PRENATAL GUMMY PO ?Take 150 mg by mouth 2 (two) times daily. ?  ?ibuprofen 200 MG tablet ?Commonly known as: ADVIL ?Take 200 mg by mouth every 6 (six) hours as needed for mild pain. ?What changed: Another medication with the same name was changed. Make sure you understand how and when to take each. ?  ?ibuprofen 600 MG tablet ?Commonly known as: ADVIL ?Take 1 tablet (600 mg total) by mouth every 6 (six) hours as needed (pain). ?What changed: how much to take ?  ?medroxyPROGESTERone 10 MG tablet ?Commonly known as: PROVERA ?Take 1 tablet (10 mg total) by mouth daily. ?  ? ?  ? ?I have spent a total of  36  minutes with the patient  reviewing hospital notes,  test results, labs and examining the patient as well as establishing an assessment and plan that was discussed personally with the patient.  > 50% of time was spent in direct patient care. ?  ? Signed: ?Charlsie Quest ?07/27/2021, 8:45 AM ? ? ?

## 2021-07-27 NOTE — Progress Notes (Unsigned)
?  ? ?  Date: 07/27/2021 ?  ?Pamela Mccarty ?[address] ?[place] ?[place] [address] ?  ?Subject: Taking care of child ?  ?To whom it may concern,  ?  ?[name] was admitted to [facility] for workup of seizure like episodes.  Her EEG was within normal limits. 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  I discussed seizure precautions with Ms. Nasby.  Specifically, in regards to caring for infants or small children, we discussed sitting down when holding, feeding, or changing them to minimize risk of injury to the child in the event you have a seizure.  ?  ?Please do not hesitate to contact me for any questions. ?  ?Dr Pamela Mccarty ?  ?  ?  ? ?

## 2021-07-27 NOTE — Discharge Instructions (Addendum)
You were admitted to epilepsy monitoring unit from 07/23/2021 to 07/27/2021.  During this period, you underwent continuous video EEG monitoring, sleep deprivation, photic stimulation and hyperventilation.  Your EEG was within normal limits.  The semiology of the episodes and normal EEG is most likely indicative of non- epileptic events.  You have not been on any antiepileptic drugs.  The diagnosis of nonepileptic events was discussed.  We recommend cognitive behavioral therapy.  However we do recommend continue seizure precautions including do not drive until 6 months from last episode/cleared by physician. ?

## 2021-07-27 NOTE — Procedures (Addendum)
Patient Name: Pamela Mccarty  ?MRN: [id number]  ?Epilepsy Attending: Charlsie Quest  ?Referring Physician/Provider: Charlsie Quest, MD ?Duration: 07/26/2021 0845 to 07/27/2021 5597 ?  ?Patient history: 26 year old female admitted to epilepsy monitoring unit for characterization of seizures episodes. ?  ?Level of alertness: Awake, asleep ?  ?AEDs during EEG study: None ?  ?Technical aspects: This EEG study was done with scalp electrodes positioned according to the 10-20 International system of electrode placement. Electrical activity was acquired at a sampling rate of 500Hz  and reviewed with a high frequency filter of 70Hz  and a low frequency filter of 1Hz . EEG data were recorded continuously and digitally stored.  ?  ?Description: The posterior dominant rhythm consists of 9 Hz activity of moderate voltage (25-35 uV) seen predominantly in posterior head regions, symmetric and reactive to eye opening and eye closing. Sleep was characterized by vertex waves, sleep spindles (12 to 14 Hz), maximal frontocentral region.   ?  ?IMPRESSION: ?This study is within normal limits. No seizures or epileptiform discharges were seen throughout the recording. ?  ?  ?

## 2021-08-02 ENCOUNTER — Ambulatory Visit: Payer: Medicaid Other | Admitting: Neurology

## 2021-08-09 ENCOUNTER — Ambulatory Visit: Payer: Medicaid Other | Admitting: Neurology

## 2021-08-09 ENCOUNTER — Encounter: Payer: Self-pay | Admitting: Neurology

## 2021-08-09 VITALS — BP 105/73 | HR 78 | Ht 66.0 in | Wt 249.5 lb

## 2021-08-09 DIAGNOSIS — R69 Illness, unspecified: Secondary | ICD-10-CM | POA: Diagnosis not present

## 2021-08-09 DIAGNOSIS — F445 Conversion disorder with seizures or convulsions: Secondary | ICD-10-CM

## 2021-08-09 NOTE — Progress Notes (Signed)
? ? ?PATIENT: Pamela Mccarty ?DOB: 1996-01-23 ? ?REASON FOR VISIT: Follow up for seizure like spells ?HISTORY FROM: Patient ?PRIMARY NEUROLOGIST: Dr. Teresa Coombs since [name] has retired  ? ?HISTORY OF PRESENT ILLNESS: ?Today 08/09/21 ?Patient presents today for follow-up, she is accompanied by her boyfriend/fianc?Marland Kitchen  She reported her last seizure was in October.  In April, she had her EMU admission, she was admitted for 5 days, her EEG was normal and there was no events.  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  We discussed her diagnosis today, she is comfortable with it and she is willing to set up care with a psychiatrist.   ? ? ?Interval history 04/04/21: ?Pamela Mccarty is a 26 year old female with history of seizures since 26 years old. EEG was unremarkable in December 2021.  MRI of the brain was initially ordered in December 2021, was cancelled in June 2022 when she was pregnant.  Had a vaginal delivery in October 2022, baby is still in NICU.  MRI of the brain is still pending.  Stopped Keppra, reports that induced seizures.  Continues to report seizure events 2-3 times a week.  Before spell, will feel dizzy, then will blackout, will have shaking of all extremities, occasionally bite her tongue, usually has urinary incontinence.  Spells are triggered by horror movies, or stress.  She does not drive a car.  Is not currently employed.  Tells me she knows when the spells are gone to happen.  Her fianc? says her eyes will roll back beforehand. She claims she has never injured herself from the spells.  Here today accompanied by her fianc?, Enid Derry. ? ?HISTORY ?03/14/2020 [name]: Ms. Sulak is a 26 year old left-handed white female with a history she claims of seizure events since she was 30 or 26 years old. She claims that her mother and her father and her brother also have seizures. The patient claims that she has never been  followed through a neurologist, she has only been getting her medications through the emergency room when she gets her seizures. The patient claims that her seizures generally occur 2-3 times a week but over the last month they have become daily, she is having 2-3 events a day now. The patient states that the seizures are associated with sudden blackout, she may have some dizziness and headache before the onset of the seizure. She claims that she will bite her tongue, have generalized jerking and may have some urinary or bowel incontinence. She may occasionally hit her head when she has a seizure. She reports some bilateral hand numbness at times and some weakness in the low back. She has been placed on Keppra and the dose was recently increased after ER visit on 08 March 2020. She claims that on Keppra her seizures seem to be getting worse. She has undergone a CT scan of the brain has been unremarkable, she has never had an EEG study or an MRI study. She is sent to this office for an evaluation.  ? ?REVIEW OF SYSTEMS: Out of a complete 14 system review of symptoms, the patient complains only of the following symptoms, and all other reviewed systems are negative. ? ?See HPI ? ?ALLERGIES: ?Allergies  ?Allergen Reactions  ? Peanut-Containing Drug Products Anaphylaxis  ?  Stomach bubbles with Peanut oil only ? ?(Patient states that she is not allergic to this)  ? Latex Other (See Comments)  ?  Redness at  site  ? Soy Allergy Hives  ? Tomato   ?  Throat swelling, shortness of breath ?  ? ? ?HOME MEDICATIONS: ?Outpatient Medications Prior to Visit  ?Medication Sig Dispense Refill  ? acetaminophen (TYLENOL) 500 MG tablet Take 500 mg by mouth as needed.    ? ibuprofen (ADVIL) 200 MG tablet Take 200 mg by mouth every 6 (six) hours as needed for mild pain.    ? Prenatal MV & Min w/FA-DHA ([facility] PRENATAL GUMMY PO) Take 150 mg by mouth 2 (two) times daily.    ? acetaminophen (TYLENOL) 500 MG tablet Take 2 tablets (1,000 mg  total) by mouth every 8 (eight) hours as needed (pain). 60 tablet 0  ? ibuprofen (ADVIL) 600 MG tablet Take 1 tablet (600 mg total) by mouth every 6 (six) hours as needed (pain). (Patient taking differently: Take 200 mg by mouth every 6 (six) hours as needed (pain).) 40 tablet 0  ? medroxyPROGESTERone (PROVERA) 10 MG tablet Take 1 tablet (10 mg total) by mouth daily. 10 tablet 0  ? ?No facility-administered medications prior to visit.  ? ? ?PAST MEDICAL HISTORY: ?Past Medical History:  ?Diagnosis Date  ? ADHD (attention deficit hyperactivity disorder)   ? Anxiety   ? Asthma   ? inhaler as a child  ? Depression   ? Epilepsy (HCC)   ? Headache(784.0)   ? Obesity   ? Seizures (HCC)   ? last seizure 08/29/19  ? Seizures (HCC) 02/02/2021  ? per pt; 2/2 post partum blood loss  ? ? ?PAST SURGICAL HISTORY: ?Past Surgical History:  ?Procedure Laterality Date  ? ADENOIDECTOMY    ? ANKLE SURGERY    ? ANKLE SURGERY  at 26yo  ? Pt. reports to lengthen her tendons  ? TONSILLECTOMY    ? ? ?FAMILY HISTORY: ?Family History  ?Problem Relation Age of Onset  ? Bipolar disorder Mother   ? Cancer Mother   ? Diabetes Mother   ? Hypertension Mother   ? Seizures Mother   ? Hypertension Father   ? Stroke Father   ? Seizures Father   ? Seizures Brother   ? Heart disease Maternal Grandfather   ? Heart attack Maternal Grandfather   ? ? ?SOCIAL HISTORY: ?Social History  ? ?Socioeconomic History  ? Marital status: Media planner  ?  Spouse name: Enid Derry  ? Number of children: 1  ? Years of education: Not on file  ? Highest education level: Not on file  ?Occupational History  ? Occupation: Full Time  ?  Comment: [employer]  ?Tobacco Use  ? Smoking status: Former  ?  Packs/day: 0.10  ?  Types: Cigarettes  ?  Quit date: 01/13/2016  ?  Years since quitting: 5.5  ? Smokeless tobacco: Never  ?Vaping Use  ? Vaping Use: Never used  ?Substance and Sexual Activity  ? Alcohol use: No  ? Drug use: Not Currently  ?  Frequency: 7.0 times per week  ?  Types: Marijuana  ?   Comment: none since pregnancy  ? Sexual activity: Yes  ?  Partners: Male  ?  Birth control/protection: None  ?  Comment: reports she is "gay", has had a boyfriend in past, but states she is only interested in girls, does not want mom to know  ?Other Topics Concern  ? Not on file  ?Social History Narrative  ? Lives with Enid Derry and his mom and dad  ? Left Handed  ? Drinks 7-8 cups caffeine  ? ?Social  Determinants of Health  ? ?Financial Resource Strain: Low Risk   ? Difficulty of Paying Living Expenses: Not hard at all  ?Food Insecurity: Food Insecurity Present  ? Worried About Programme researcher, broadcasting/film/videounning Out of Food in the Last Year: Never true  ? Ran Out of Food in the Last Year: Sometimes true  ?Transportation Needs: No Transportation Needs  ? Lack of Transportation (Medical): No  ? Lack of Transportation (Non-Medical): No  ?Physical Activity: Insufficiently Active  ? Days of Exercise per Week: 2 days  ? Minutes of Exercise per Session: 60 min  ?Stress: No Stress Concern Present  ? Feeling of Stress : Not at all  ?Social Connections: Socially Integrated  ? Frequency of Communication with Friends and Family: More than three times a week  ? Frequency of Social Gatherings with Friends and Family: More than three times a week  ? Attends Religious Services: More than 4 times per year  ? Active Member of Clubs or Organizations: No  ? Attends BankerClub or Organization Meetings: More than 4 times per year  ? Marital Status: Living with partner  ?Intimate Partner Violence: Not At Risk  ? Fear of Current or Ex-Partner: No  ? Emotionally Abused: No  ? Physically Abused: No  ? Sexually Abused: No  ? ?PHYSICAL EXAM ? ?Vitals:  ? 08/09/21 1101  ?BP: 105/73  ?Pulse: 78  ?Weight: 249 lb 8 oz (113.2 kg)  ?Height: 5\' 6"  (1.676 m)  ? ?Body mass index is 40.27 kg/m?. ? ?Generalized: Well developed, in no acute distress, disheveled ?Neurological examination  ?Mentation: Alert oriented to time, place, history taking. Follows all commands speech and language  fluent ?Cranial nerve II-XII: Pupils were equal round reactive to light. Extraocular movements were full, visual field were full on confrontational test. Facial sensation and strength were normal. Head turn

## 2021-08-09 NOTE — Patient Instructions (Signed)
Referral to psychiatry  ?Follow up as needed  ?

## 2021-08-13 ENCOUNTER — Telehealth: Payer: Self-pay | Admitting: Neurology

## 2021-08-13 NOTE — Telephone Encounter (Signed)
Referral for Psychiatry sent to [facility] [phone]. 

## 2021-09-10 DIAGNOSIS — R69 Illness, unspecified: Secondary | ICD-10-CM | POA: Diagnosis not present

## 2021-09-14 DIAGNOSIS — R69 Illness, unspecified: Secondary | ICD-10-CM | POA: Diagnosis not present

## 2021-09-17 ENCOUNTER — Encounter: Payer: Self-pay | Admitting: Women's Health

## 2021-09-17 DIAGNOSIS — R69 Illness, unspecified: Secondary | ICD-10-CM | POA: Diagnosis not present

## 2021-09-18 ENCOUNTER — Encounter: Payer: Self-pay | Admitting: Women's Health

## 2021-09-18 ENCOUNTER — Ambulatory Visit (INDEPENDENT_AMBULATORY_CARE_PROVIDER_SITE_OTHER): Payer: Medicaid Other | Admitting: Women's Health

## 2021-09-18 VITALS — BP 124/77 | HR 102 | Wt 255.6 lb

## 2021-09-18 DIAGNOSIS — Z30430 Encounter for insertion of intrauterine contraceptive device: Secondary | ICD-10-CM | POA: Insufficient documentation

## 2021-09-18 DIAGNOSIS — Z3202 Encounter for pregnancy test, result negative: Secondary | ICD-10-CM | POA: Diagnosis not present

## 2021-09-18 LAB — POCT URINE PREGNANCY: Preg Test, Ur: NEGATIVE

## 2021-09-18 MED ORDER — LEVONORGESTREL 20.1 MCG/DAY IU IUD
1.0000 | INTRAUTERINE_SYSTEM | Freq: Once | INTRAUTERINE | Status: AC
  Administered 2021-09-18: 1 via INTRAUTERINE

## 2021-09-18 NOTE — Progress Notes (Signed)
IUD INSERTION Patient name: Pamela Mccarty MRN [id number]  Date of birth: 10/04/1995 Subjective Findings:   Pamela Mccarty is a 26 y.o. [phone] Caucasian female being seen today for insertion of a Liletta IUD.  Patient's last menstrual period was 09/16/2021. Last sexual intercourse was >2wks ago, and is on her period Last pap5/24/22. Results were: NILM w/ HRHPV not done  The risks and benefits of the method and placement have been thouroughly reviewed with the patient and all questions were answered.  Specifically the patient is aware of failure rate of 04/998, expulsion of the IUD and of possible perforation.  The patient is aware of irregular bleeding due to the method and understands the incidence of irregular bleeding diminishes with time.  Signed copy of informed consent in chart.      12/26/2020    8:48 AM 09/18/2020    9:24 AM 08/29/2020    1:54 PM  Depression screen PHQ 2/9  Decreased Interest 0 0 0  Down, Depressed, Hopeless 0 0 0  PHQ - 2 Score 0 0 0  Altered sleeping 2 0 1  Tired, decreased energy 2 0 1  Change in appetite 2 0 0  Feeling bad or failure about yourself  0 0 0  Trouble concentrating 0 0 0  Moving slowly or fidgety/restless 0 0 0  Suicidal thoughts 0 0 0  PHQ-9 Score 6 0 2        12/26/2020    8:49 AM 09/18/2020    9:25 AM 08/29/2020    1:54 PM  GAD 7 : Generalized Anxiety Score  Nervous, Anxious, on Edge 0 0 0  Control/stop worrying 0 0 0  Worry too much - different things 0 0 0  Trouble relaxing 0 1 0  Restless 0 0 0  Easily annoyed or irritable 0 2 0  Afraid - awful might happen 0 0 0  Total GAD 7 Score 0 3 0     Pertinent History Reviewed:   Reviewed past medical,surgical, social, obstetrical and family history.  Reviewed problem list, medications and allergies. Objective Findings & Procedure:   Vitals:   09/18/21 1043 09/18/21 1045  BP: 131/77 124/77  Pulse: (!) 101 (!) 102  Weight: 255 lb 9.6 oz (115.9 kg)   Body mass index  is 41.25 kg/m.  No results found for this or any previous visit (from the past 24 hour(s)).   Time out was performed.  A graves speculum was placed in the vagina.  The cervix was visualized, prepped using Betadine, and grasped with a single tooth tenaculum. The uterus was found to be anteroflexed and it sounded to 10 cm.  Liletta  IUD placed per manufacturer's recommendations. The strings were trimmed to approximately 3 cm. The patient tolerated the procedure well.   Informal transvaginal sonogram was performed and the proper placement of the IUD was verified.  Chaperone: Jobe Marker   Assessment & Plan:   1) Liletta IUD insertion The patient was given post procedure instructions, including signs and symptoms of infection and to check for the strings after each menses or each month, and refraining from intercourse or anything in the vagina for 3 days. She was given a care card with date IUD placed, and date IUD to be removed. She is scheduled for a f/u appointment in 4 weeks.  No orders of the defined types were placed in this encounter.   Return in about 4 weeks (around 10/16/2021) for IUD f/u, CNM, in person.  [place], [facility] 09/18/2021 11:24 AM

## 2021-09-18 NOTE — Patient Instructions (Signed)
Tips To Increase Milk Supply Lots of water! Enough so that your urine is clear Plenty of calories, if you're not getting enough calories, your milk supply can decrease Breastfeed/pump often, every 2-3 hours x 20-30mins Fenugreek 3 pills 3 times a day, this may make your urine smell like maple syrup Mother's Milk Tea Lactation cookies, google for the recipe Real oatmeal Body Armor sports drinks Greater Than hydration drink   Nothing in vagina for 3 days (no sex, douching, tampons, etc...) Check your strings once a month to make sure you can feel them, if you are not able to please let us know If you develop a fever of 100.4 or more in the next few weeks, or if you develop severe abdominal pain, please let [place] know Use a backup method of birth control, such as condoms, for 2 weeks   Intrauterine Device Insertion, Care After This sheet gives you information about how to care for yourself after your procedure. Your health care provider may also give you more specific instructions. If you have problems or questions, contact your health care provider. What can I expect after the procedure? After the procedure, it is common to have: Cramps and pain in the abdomen. Bleeding. It may be light or heavy. This may last for a few days. Lower back pain. Dizziness. Headaches. Nausea. Follow these instructions at home:  Before resuming sexual activity, check to make sure that you can feel the IUD string or strings. You should be able to feel the end of the string below the opening of your cervix. If your IUD string is in place, you may resume sexual activity. If you had a hormonal IUD inserted more than 7 days after your most recent period started, you will need to use a backup method of birth control for 7 days after IUD insertion. Ask your health care provider whether this applies to you. Continue to check that the IUD is still in place by feeling for the strings after every menstrual period, or once a  month. An IUD will not protect you from sexually transmitted infections (STIs). Use methods to prevent the exchange of body fluids between partners (barrier protection) every time you have sex. Barrier protection can be used during oral, vaginal, or anal sex. Commonly used barrier methods include: Female condom. Female condom. Dental dam. Take over-the-counter and prescription medicines only as told by your health care provider. Keep all follow-up visits as told by your health care provider. This is important. Contact a health care provider if: You feel light-headed or weak. You have any of the following problems with your IUD string or strings: The string bothers or hurts you or your sexual partner. You cannot feel the string. The string has gotten longer. You can feel the IUD in your vagina. You think you may be pregnant, or you miss your menstrual period. You think you may have a sexually transmitted infection (STI). Get help right away if: You have flu-like symptoms, such as tiredness (fatigue) and muscle aches. You have a fever and chills. You have bleeding that is heavier or lasts longer than a normal menstrual cycle. You have abnormal or bad-smelling discharge from your vagina. You develop abdominal pain that is new, is getting worse, or is not in the same area of earlier cramping and pain. You have pain during sexual activity. Summary After the procedure, it is common to have cramps and pain in the abdomen. It is also common to have light bleeding or heavier bleeding  that is like your menstrual period. Continue to check that the IUD is still in place by feeling for the strings after every menstrual period, or once a month. Keep all follow-up visits as told by your health care provider. This is important. Contact your health care provider if you have problems with your IUD strings, such as the string getting longer or bothering you or your sexual partner. This information is not  intended to replace advice given to you by your health care provider. Make sure you discuss any questions you have with your health care provider. Document Revised: 03/16/2019 Document Reviewed: 03/16/2019 Elsevier Patient Education  [place].

## 2021-09-20 DIAGNOSIS — Z3483 Encounter for supervision of other normal pregnancy, third trimester: Secondary | ICD-10-CM | POA: Diagnosis not present

## 2021-09-20 DIAGNOSIS — Z3482 Encounter for supervision of other normal pregnancy, second trimester: Secondary | ICD-10-CM | POA: Diagnosis not present

## 2021-09-24 DIAGNOSIS — R69 Illness, unspecified: Secondary | ICD-10-CM | POA: Diagnosis not present

## 2021-10-03 ENCOUNTER — Encounter (HOSPITAL_COMMUNITY): Payer: Self-pay | Admitting: Emergency Medicine

## 2021-10-03 ENCOUNTER — Emergency Department (HOSPITAL_COMMUNITY)
Admission: EM | Admit: 2021-10-03 | Discharge: 2021-10-03 | Disposition: A | Discharge status: Home / Self Care | Payer: Medicaid Other | Attending: Emergency Medicine | Admitting: Emergency Medicine

## 2021-10-03 ENCOUNTER — Other Ambulatory Visit: Payer: Self-pay

## 2021-10-03 DIAGNOSIS — N939 Abnormal uterine and vaginal bleeding, unspecified: Secondary | ICD-10-CM | POA: Diagnosis not present

## 2021-10-03 DIAGNOSIS — Z91040 Latex allergy status: Secondary | ICD-10-CM | POA: Diagnosis not present

## 2021-10-03 DIAGNOSIS — R112 Nausea with vomiting, unspecified: Secondary | ICD-10-CM | POA: Diagnosis not present

## 2021-10-03 DIAGNOSIS — Z91010 Allergy to peanuts: Secondary | ICD-10-CM | POA: Diagnosis not present

## 2021-10-03 LAB — CBC WITH DIFFERENTIAL/PLATELET
Abs Immature Granulocytes: 0.02 10*3/uL (ref 0.00–0.07)
Basophils Absolute: 0.1 10*3/uL (ref 0.0–0.1)
Basophils Relative: 1 %
Eosinophils Absolute: 0.3 10*3/uL (ref 0.0–0.5)
Eosinophils Relative: 4 %
HCT: 46.6 % — ABNORMAL HIGH (ref 36.0–46.0)
Hemoglobin: 16.1 g/dL — ABNORMAL HIGH (ref 12.0–15.0)
Immature Granulocytes: 0 %
Lymphocytes Relative: 33 %
Lymphs Abs: 2.2 10*3/uL (ref 0.7–4.0)
MCH: 30.7 pg (ref 26.0–34.0)
MCHC: 34.5 g/dL (ref 30.0–36.0)
MCV: 88.9 fL (ref 80.0–100.0)
Monocytes Absolute: 0.5 10*3/uL (ref 0.1–1.0)
Monocytes Relative: 7 %
Neutro Abs: 3.8 10*3/uL (ref 1.7–7.7)
Neutrophils Relative %: 55 %
Platelets: 249 10*3/uL (ref 150–400)
RBC: 5.24 MIL/uL — ABNORMAL HIGH (ref 3.87–5.11)
RDW: 12.2 % (ref 11.5–15.5)
WBC: 6.8 10*3/uL (ref 4.0–10.5)
nRBC: 0 % (ref 0.0–0.2)

## 2021-10-03 LAB — URINALYSIS, ROUTINE W REFLEX MICROSCOPIC
Bilirubin Urine: NEGATIVE
Glucose, UA: NEGATIVE mg/dL
Ketones, ur: NEGATIVE mg/dL
Leukocytes,Ua: NEGATIVE
Nitrite: NEGATIVE
Protein, ur: NEGATIVE mg/dL
Specific Gravity, Urine: 1.024 (ref 1.005–1.030)
pH: 6 (ref 5.0–8.0)

## 2021-10-03 LAB — TYPE AND SCREEN
ABO/RH(D): B POS
Antibody Screen: NEGATIVE

## 2021-10-03 LAB — PREGNANCY, URINE: Preg Test, Ur: NEGATIVE

## 2021-10-03 MED ORDER — ONDANSETRON HCL 4 MG PO TABS: 4.0000 mg | ORAL_TABLET | Freq: Four times a day (QID) | ORAL | 0 refills | Status: DC

## 2021-10-03 MED ORDER — ONDANSETRON HCL 4 MG/2ML IJ SOLN
4.0000 mg | Freq: Once | INTRAMUSCULAR | Status: AC
  Administered 2021-10-03: 4 mg via INTRAVENOUS
  Filled 2021-10-03: qty 2

## 2021-10-03 MED ORDER — SODIUM CHLORIDE 0.9 % IV BOLUS
1000.0000 mL | Freq: Once | INTRAVENOUS | Status: AC
  Administered 2021-10-03: 1000 mL via INTRAVENOUS

## 2021-10-03 NOTE — ED Triage Notes (Signed)
Pt presents with abnormal vaginal bleeding from IUD placed approx 3 weeks ago.

## 2021-10-03 NOTE — ED Provider Notes (Signed)
[facility] EMERGENCY DEPARTMENT Provider Note   CSN: [id number] Arrival date & time: 10/03/21  1240     History  Chief Complaint  Patient presents with   Vaginal Bleeding    Pamela Mccarty is a 26 y.o. female.   Vaginal Bleeding   This patient is a 26 year old female, she has a history of some anxiety recently started on citalopram, trazodone for sleep, those were both in the last 2 weeks.  She had an IUD that was placed approximately 4 days before that.  Coinciding with the start of her anxiety medication she started to have some generalized nausea, generalized weakness, having some vaginal spotting, recently she tried to stand up to carry her child into the other room and felt like she was get a pass out.  At this time she feels nauseated but has no other symptoms.  She does have a feeling of some vaginal discomfort after the IUD was placed which has been constant.  She has not been able to follow-up with her gynecologist since placement  Home Medications Prior to Admission medications   Medication Sig Start Date End Date Taking? Authorizing Provider  ondansetron (ZOFRAN) 4 MG tablet Take 1 tablet (4 mg total) by mouth every 6 (six) hours. 10/03/21  Yes Eber Hong, MD  acetaminophen (TYLENOL) 500 MG tablet Take 500 mg by mouth as needed.    [provider]  ibuprofen (ADVIL) 200 MG tablet Take 200 mg by mouth every 6 (six) hours as needed for mild pain. Patient not taking: Reported on 09/18/2021    [provider]  Prenatal MV & Min w/FA-DHA ([facility] PRENATAL GUMMY PO) Take 150 mg by mouth 2 (two) times daily.    [provider]  traZODone (DESYREL) 100 MG tablet Take 100 mg by mouth at bedtime. 09/17/21   [provider]      Allergies    Peanut-containing drug products, Latex, Soy allergy, and Tomato    Review of Systems   Review of Systems  Genitourinary:  Positive for vaginal bleeding.  All other systems reviewed and are  negative.   Physical Exam Updated Vital Signs BP 120/65   Pulse 71   Temp 97.7 F (36.5 C) (Oral)   Resp 16   Ht 1.676 m (5\' 6" )   Wt 106.6 kg   LMP 09/16/2021   SpO2 99%   BMI 37.93 kg/m  Physical Exam Vitals and nursing note reviewed.  Constitutional:      General: She is not in acute distress.    Appearance: She is well-developed.  HENT:     Head: Normocephalic and atraumatic.     Mouth/Throat:     Mouth: Mucous membranes are dry.     Pharynx: No oropharyngeal exudate.  Eyes:     General: No scleral icterus.       Right eye: No discharge.        Left eye: No discharge.     Conjunctiva/sclera: Conjunctivae normal.     Pupils: Pupils are equal, round, and reactive to light.  Neck:     Thyroid: No thyromegaly.     Vascular: No JVD.  Cardiovascular:     Rate and Rhythm: Normal rate and regular rhythm.     Heart sounds: Normal heart sounds. No murmur heard.    No friction rub. No gallop.  Pulmonary:     Effort: Pulmonary effort is normal. No respiratory distress.     Breath sounds: Normal breath sounds. No wheezing or rales.  Abdominal:     General: Bowel sounds are normal. There is no distension.     Palpations: Abdomen is soft. There is no mass.     Tenderness: There is no abdominal tenderness.  Musculoskeletal:        General: No tenderness. Normal range of motion.     Cervical back: Normal range of motion and neck supple.     Right lower leg: No edema.     Left lower leg: No edema.  Lymphadenopathy:     Cervical: No cervical adenopathy.  Skin:    General: Skin is warm and dry.     Findings: No erythema or rash.  Neurological:     Mental Status: She is alert.     Coordination: Coordination normal.  Psychiatric:        Behavior: Behavior normal.     ED Results / Procedures / Treatments   Labs (all labs ordered are listed, but only abnormal results are displayed) Labs Reviewed  CBC WITH DIFFERENTIAL/PLATELET - Abnormal; Notable for the following  components:      Result Value   RBC 5.24 (*)    Hemoglobin 16.1 (*)    HCT 46.6 (*)    All other components within normal limits  URINALYSIS, ROUTINE W REFLEX MICROSCOPIC - Abnormal; Notable for the following components:   Hgb urine dipstick LARGE (*)    Bacteria, UA RARE (*)    All other components within normal limits  PREGNANCY, URINE  TYPE AND SCREEN    EKG None  Radiology No results found.  Procedures Procedures    Medications Ordered in ED Medications  sodium chloride 0.9 % bolus 1,000 mL (0 mLs Intravenous Stopped 10/03/21 1835)  ondansetron (ZOFRAN) injection 4 mg (4 mg Intravenous Given 10/03/21 1748)    ED Course/ Medical Decision Making/ A&P                           Medical Decision Making Amount and/or Complexity of Data Reviewed Labs: ordered.  Risk Prescription drug management.   This patient appears somewhat dehydrated, I suspect that her symptoms are more than likely related to the medications that she is taking such as the new citalopram and trazodone as opposed to the IUD.  She had question whether we can take the IUD out but I counseled her against this until she can follow-up with her gynecologist.  She is not anemic based on labs but she does appear some clinical dryness and may have some hemoconcentration so I have requested some IV fluids and Zofran to which the patient has agreed.  We will also order a urinalysis to make sure that she is not pregnant nor does she have any signs of urinary tract infection.  The patient is agreeable  IV fluids given, urinalysis without infection or pregnancy  Patient informed of all of her results including her CBC with normal blood counts  Recommended stopping Celexa as this is likely the cause of the nausea and not the IUD.  Patient agreeable        Final Clinical Impression(s) / ED Diagnoses Final diagnoses:  Vaginal bleeding  Nausea and vomiting, unspecified vomiting type    Rx / DC Orders ED  Discharge Orders          Ordered    ondansetron (ZOFRAN) 4 MG tablet  Every 6 hours        10/03/21 1835  Eber Hong, MD 10/03/21 [phone]

## 2021-10-03 NOTE — Discharge Instructions (Addendum)
Please stop taking the Celexa, this is likely the cause of your symptoms.  Please follow-up with your gynecologist regarding your IUD.  I have given you a prescription for ondansetron which may help with your nausea, drink plenty of clear liquids  Zofran is a medication which can help with nausea.  You may take 4 mg by mouth every 6 hours as needed if you are an adult, if your child under the age of 6 take half of a tablet or 2 mg every 6 hours as needed.  This should dissolve on your tongue within a short timeframe.  Wait about 30 minutes after taking it to help with drinking clear liquids.  Thank you for allowing [place] to treat you in the emergency department today.  After reviewing your examination and potential testing that was done it appears that you are safe to go home.  I would like for you to follow-up with your doctor within the next several days, have them obtain your results and follow-up with them to review all of these tests.  If you should develop severe or worsening symptoms return to the emergency department immediately

## 2021-10-04 ENCOUNTER — Ambulatory Visit: Payer: Medicaid Other | Admitting: Adult Health

## 2021-10-16 ENCOUNTER — Ambulatory Visit: Payer: Medicaid Other | Admitting: Women's Health

## 2021-12-20 ENCOUNTER — Emergency Department (HOSPITAL_COMMUNITY)
Admission: EM | Admit: 2021-12-20 | Discharge: 2021-12-20 | Disposition: A | Discharge status: Home / Self Care | Payer: Self-pay | Attending: Emergency Medicine | Admitting: Emergency Medicine

## 2021-12-20 ENCOUNTER — Other Ambulatory Visit: Payer: Self-pay

## 2021-12-20 ENCOUNTER — Encounter (HOSPITAL_COMMUNITY): Payer: Self-pay

## 2021-12-20 ENCOUNTER — Emergency Department (HOSPITAL_COMMUNITY): Payer: Self-pay

## 2021-12-20 DIAGNOSIS — Z5321 Procedure and treatment not carried out due to patient leaving prior to being seen by health care provider: Secondary | ICD-10-CM | POA: Insufficient documentation

## 2021-12-20 DIAGNOSIS — M79644 Pain in right finger(s): Secondary | ICD-10-CM | POA: Insufficient documentation

## 2021-12-20 DIAGNOSIS — W230XXA Caught, crushed, jammed, or pinched between moving objects, initial encounter: Secondary | ICD-10-CM | POA: Insufficient documentation

## 2021-12-20 NOTE — ED Triage Notes (Addendum)
Pt c/o right index finger pain, states she slammed it in a door. Minimal swelling noted.

## 2021-12-20 NOTE — ED Notes (Signed)
Called pt no response.  

## 2021-12-20 NOTE — ED Notes (Signed)
Pt left. 

## 2021-12-20 NOTE — ED Notes (Signed)
Pt has been called multiple times in lobby, no answer. Assumed LWBS

## 2022-03-05 ENCOUNTER — Ambulatory Visit: Payer: 59 | Admitting: Adult Health

## 2022-03-18 ENCOUNTER — Other Ambulatory Visit: Payer: Self-pay

## 2022-03-18 ENCOUNTER — Emergency Department (HOSPITAL_COMMUNITY)
Admission: EM | Admit: 2022-03-18 | Discharge: 2022-03-18 | Disposition: A | Discharge status: Home / Self Care | Payer: 59 | Attending: Emergency Medicine | Admitting: Emergency Medicine

## 2022-03-18 ENCOUNTER — Emergency Department (HOSPITAL_COMMUNITY): Payer: 59

## 2022-03-18 ENCOUNTER — Encounter (HOSPITAL_COMMUNITY): Payer: Self-pay | Admitting: *Deleted

## 2022-03-18 DIAGNOSIS — R58 Hemorrhage, not elsewhere classified: Secondary | ICD-10-CM | POA: Diagnosis not present

## 2022-03-18 DIAGNOSIS — Z91040 Latex allergy status: Secondary | ICD-10-CM | POA: Diagnosis not present

## 2022-03-18 DIAGNOSIS — N938 Other specified abnormal uterine and vaginal bleeding: Secondary | ICD-10-CM | POA: Insufficient documentation

## 2022-03-18 DIAGNOSIS — R109 Unspecified abdominal pain: Secondary | ICD-10-CM | POA: Diagnosis not present

## 2022-03-18 DIAGNOSIS — R52 Pain, unspecified: Secondary | ICD-10-CM | POA: Diagnosis not present

## 2022-03-18 DIAGNOSIS — Z743 Need for continuous supervision: Secondary | ICD-10-CM | POA: Diagnosis not present

## 2022-03-18 DIAGNOSIS — R1084 Generalized abdominal pain: Secondary | ICD-10-CM | POA: Diagnosis not present

## 2022-03-18 DIAGNOSIS — N939 Abnormal uterine and vaginal bleeding, unspecified: Secondary | ICD-10-CM

## 2022-03-18 DIAGNOSIS — T8332XA Displacement of intrauterine contraceptive device, initial encounter: Secondary | ICD-10-CM

## 2022-03-18 DIAGNOSIS — Z91010 Allergy to peanuts: Secondary | ICD-10-CM | POA: Insufficient documentation

## 2022-03-18 LAB — CBC WITH DIFFERENTIAL/PLATELET
Abs Immature Granulocytes: 0.02 10*3/uL (ref 0.00–0.07)
Basophils Absolute: 0 10*3/uL (ref 0.0–0.1)
Basophils Relative: 1 %
Eosinophils Absolute: 0.1 10*3/uL (ref 0.0–0.5)
Eosinophils Relative: 2 %
HCT: 49.8 % — ABNORMAL HIGH (ref 36.0–46.0)
Hemoglobin: 16.9 g/dL — ABNORMAL HIGH (ref 12.0–15.0)
Immature Granulocytes: 0 %
Lymphocytes Relative: 36 %
Lymphs Abs: 2 10*3/uL (ref 0.7–4.0)
MCH: 29.8 pg (ref 26.0–34.0)
MCHC: 33.9 g/dL (ref 30.0–36.0)
MCV: 87.8 fL (ref 80.0–100.0)
Monocytes Absolute: 0.4 10*3/uL (ref 0.1–1.0)
Monocytes Relative: 7 %
Neutro Abs: 2.9 10*3/uL (ref 1.7–7.7)
Neutrophils Relative %: 54 %
Platelets: 253 10*3/uL (ref 150–400)
RBC: 5.67 MIL/uL — ABNORMAL HIGH (ref 3.87–5.11)
RDW: 11.9 % (ref 11.5–15.5)
WBC: 5.4 10*3/uL (ref 4.0–10.5)
nRBC: 0 % (ref 0.0–0.2)

## 2022-03-18 LAB — HEPATIC FUNCTION PANEL
ALT: 20 U/L (ref 0–44)
AST: 19 U/L (ref 15–41)
Albumin: 4.2 g/dL (ref 3.5–5.0)
Alkaline Phosphatase: 61 U/L (ref 38–126)
Bilirubin, Direct: <0.1 mg/dL (ref 0.0–0.2)
Total Bilirubin: 0.5 mg/dL (ref 0.3–1.2)
Total Protein: 7.6 g/dL (ref 6.5–8.1)

## 2022-03-18 LAB — BASIC METABOLIC PANEL
Anion gap: 6 (ref 5–15)
BUN: 15 mg/dL (ref 6–20)
CO2: 23 mmol/L (ref 22–32)
Calcium: 8.8 mg/dL — ABNORMAL LOW (ref 8.9–10.3)
Chloride: 109 mmol/L (ref 98–111)
Creatinine, Ser: 0.87 mg/dL (ref 0.44–1.00)
GFR, Estimated: >60 mL/min (ref >60)
Glucose, Bld: 98 mg/dL (ref 70–99)
Potassium: 3.6 mmol/L (ref 3.5–5.1)
Sodium: 138 mmol/L (ref 135–145)

## 2022-03-18 LAB — URINALYSIS, ROUTINE W REFLEX MICROSCOPIC
Bacteria, UA: NONE SEEN
Bilirubin Urine: NEGATIVE
Glucose, UA: NEGATIVE mg/dL
Ketones, ur: NEGATIVE mg/dL
Leukocytes,Ua: NEGATIVE
Nitrite: NEGATIVE
Protein, ur: NEGATIVE mg/dL
Specific Gravity, Urine: 1.025 (ref 1.005–1.030)
pH: 6 (ref 5.0–8.0)

## 2022-03-18 LAB — LIPASE, BLOOD: Lipase: 30 U/L (ref 11–51)

## 2022-03-18 LAB — HCG, QUANTITATIVE, PREGNANCY: hCG, Beta Chain, Quant, S: <1 m[IU]/mL (ref <5)

## 2022-03-18 LAB — I-STAT BETA HCG BLOOD, ED (MC, WL, AP ONLY): I-stat hCG, quantitative: <5 m[IU]/mL (ref <5)

## 2022-03-18 MED ORDER — IOHEXOL 300 MG/ML  SOLN
100.0000 mL | Freq: Once | INTRAMUSCULAR | Status: AC | PRN
  Administered 2022-03-18: 100 mL via INTRAVENOUS

## 2022-03-18 MED ORDER — MEDROXYPROGESTERONE ACETATE 10 MG PO TABS: 10.0000 mg | ORAL_TABLET | Freq: Every day | ORAL | 0 refills | Status: DC

## 2022-03-18 MED ORDER — SODIUM CHLORIDE 0.9 % IV BOLUS
500.0000 mL | Freq: Once | INTRAVENOUS | Status: AC
  Administered 2022-03-18: 500 mL via INTRAVENOUS

## 2022-03-18 MED ORDER — ONDANSETRON HCL 4 MG/2ML IJ SOLN
4.0000 mg | Freq: Once | INTRAMUSCULAR | Status: AC
  Administered 2022-03-18: 4 mg via INTRAVENOUS
  Filled 2022-03-18: qty 2

## 2022-03-18 MED ORDER — KETOROLAC TROMETHAMINE 30 MG/ML IJ SOLN
30.0000 mg | Freq: Once | INTRAMUSCULAR | Status: AC
  Administered 2022-03-18: 30 mg via INTRAVENOUS
  Filled 2022-03-18: qty 1

## 2022-03-18 NOTE — Consult Note (Addendum)
Called by provider at [facility] ED regarding patient's vaginal bleeding and malpositioned IUD.  Per our discussion patient has been having issues with pain and excessive bleeding since insertion in June 2023. She also reported to ED that she has been able to feel strings as well as tip of IUD.   Workup in ED notable for unremarkable CBC, and CT A/P which showed IUD in low position and rotated.  Imaging reviewed personally, it does appear to be in a low position and somewhat levorotated.  Reviewed with provider that it is still functioning with regards to birth control, however not surprising that she is still having issues with bleeding. OK to give OCP or POP's for bleeding, though would lean towards POPs given BMI. Can pull IUD in ED or can schedule follow up visit with [facility] to discuss getting alternative options placed at the same time prior to removal.  I spent 15 minutes performing this consult, including time spent discussing with ED provider, reviewing imaging, and providing recommendations.   Venora Maples, MD/MPH Attending Family Medicine Physician, [facility] for [facility], [facility]  1:42 PM 03/18/22

## 2022-03-18 NOTE — ED Triage Notes (Signed)
Pt brought in by [facility] for c/o vaginal bleeding; pt having severe abdominal pain  Pt states the bleeding is dark red  Pt has IUD in place  Pt states she has saturated 5 tampons since 5am  Pt states she feels dizzy with standing and nauseous  BP 130' s systolic P 75   20G left forearm pt given of NS

## 2022-03-18 NOTE — Discharge Instructions (Addendum)
As discussed, start the medication and take as directed for 14 days.  This should help with your bleeding.  [facility] to arrange a follow-up appointment.  Return to the emergency department for any new or worsening symptoms.

## 2022-03-18 NOTE — ED Notes (Signed)
Pt was aware that a urine sample was needed but was not able to give one because pt stated that she had to use the bathroom to bad. Pt stated that she will get the urine sample at a later time.

## 2022-03-18 NOTE — ED Notes (Signed)
Pelvic exam supplies set up and pt undressed

## 2022-03-19 LAB — GC/CHLAMYDIA PROBE AMP (~~LOC~~) NOT AT ARMC
Chlamydia: NEGATIVE
Comment: NEGATIVE
Comment: NORMAL
Neisseria Gonorrhea: NEGATIVE

## 2022-03-21 NOTE — ED Provider Notes (Signed)
[facility] EMERGENCY DEPARTMENT Provider Note   CSN: [id number] Arrival date & time: 03/18/22  8127     History  Chief Complaint  Patient presents with   Vaginal Bleeding    Pamela Mccarty is a 26 y.o. female.   Vaginal Bleeding Associated symptoms: abdominal pain, dizziness and nausea   Associated symptoms: no dysuria, no fever and no vaginal discharge        Pamela Mccarty is a 26 y.o. female who presents to the Emergency Department complaining of heavy vaginal bleeding and lower abdominal pain/cramping.  States that she began bleeding earlier on the morning of arrival.  Describes the bleeding is heavy and states she has saturated 5 tampons since 5 AM.  Has had IUD in place for several months and states that she can feel the IUD moving.  Her abdominal pain is associated with some nausea and dizziness upon standing.  She denies any fever, diarrhea, vomiting, or dysuria.  No flank pain or excessive vaginal discharge.    Home Medications Prior to Admission medications   Medication Sig Start Date End Date Taking? Authorizing Provider  acetaminophen (TYLENOL) 500 MG tablet Take 500 mg by mouth every 4 (four) hours as needed for moderate pain or fever.   Yes [provider]  medroxyPROGESTERone (PROVERA) 10 MG tablet Take 1 tablet (10 mg total) by mouth daily. 03/18/22  Yes Shaneequa Bahner, PA-C  ondansetron (ZOFRAN) 4 MG tablet Take 1 tablet (4 mg total) by mouth every 6 (six) hours. Patient not taking: Reported on 03/18/2022 10/03/21   Eber Hong, MD      Allergies    Peanut-containing drug products, Latex, Soy allergy, and Tomato    Review of Systems   Review of Systems  Constitutional:  Negative for appetite change, chills and fever.  Respiratory:  Negative for chest tightness and shortness of breath.   Cardiovascular:  Negative for chest pain.  Gastrointestinal:  Positive for abdominal pain and nausea. Negative for diarrhea and vomiting.   Genitourinary:  Positive for pelvic pain and vaginal bleeding. Negative for difficulty urinating, dysuria, flank pain and vaginal discharge.  Musculoskeletal:  Negative for arthralgias.  Skin:  Negative for rash.  Neurological:  Positive for dizziness. Negative for syncope, weakness, light-headedness, numbness and headaches.    Physical Exam Updated Vital Signs BP (!) 144/78   Pulse 77   Temp 98.6 F (37 C) (Oral)   Resp 20   Ht 5\' 7"  (1.702 m)   Wt 108.9 kg   LMP 03/08/2022   SpO2 100%   BMI 37.60 kg/m  Physical Exam Vitals and nursing note reviewed. Exam conducted with a chaperone present.  Constitutional:      General: She is not in acute distress.    Appearance: Normal appearance. She is not ill-appearing or toxic-appearing.  Cardiovascular:     Rate and Rhythm: Normal rate and regular rhythm.     Pulses: Normal pulses.  Pulmonary:     Effort: Pulmonary effort is normal.  Abdominal:     General: There is no distension.     Tenderness: There is no abdominal tenderness. There is no right CVA tenderness, left CVA tenderness or guarding.  Genitourinary:    Vagina: No foreign body. Bleeding present. No vaginal discharge.     Cervix: Cervical bleeding present. No cervical motion tenderness.     Uterus: Tender.      Adnexa:        Right: No mass or tenderness.  Left: No mass or tenderness.       Comments: Bimanual and speculum exam performed by me, no cervical motion tenderness or obvious vaginal discharge, there is some blood in the vaginal vault and from the cervix.  String from IUD is visualized by me.  I cannot palpate or visualize the contraceptive device only the string. Musculoskeletal:        General: Normal range of motion.  Skin:    General: Skin is warm.     Capillary Refill: Capillary refill takes less than 2 seconds.  Neurological:     General: No focal deficit present.     Mental Status: She is alert.     Sensory: No sensory deficit.     Motor: No  weakness.     ED Results / Procedures / Treatments   Labs (all labs ordered are listed, but only abnormal results are displayed) Labs Reviewed  CBC WITH DIFFERENTIAL/PLATELET - Abnormal; Notable for the following components:      Result Value   RBC 5.67 (*)    Hemoglobin 16.9 (*)    HCT 49.8 (*)    All other components within normal limits  BASIC METABOLIC PANEL - Abnormal; Notable for the following components:   Calcium 8.8 (*)    All other components within normal limits  URINALYSIS, ROUTINE W REFLEX MICROSCOPIC - Abnormal; Notable for the following components:   Color, Urine STRAW (*)    Hgb urine dipstick SMALL (*)    All other components within normal limits  HCG, QUANTITATIVE, PREGNANCY  HEPATIC FUNCTION PANEL  LIPASE, BLOOD  I-STAT BETA HCG BLOOD, ED ([facility], [facility], [facility] ONLY)  GC/CHLAMYDIA PROBE AMP ([facility]) NOT AT [facility]    EKG None  Radiology CT ABDOMEN PELVIS W CONTRAST  Result Date: 03/18/2022 CLINICAL DATA:  26 year old female with severe abdominal pain and vaginal bleeding. EXAM: CT ABDOMEN AND PELVIS WITH CONTRAST TECHNIQUE: Multidetector CT imaging of the abdomen and pelvis was performed using the standard protocol following bolus administration of intravenous contrast. RADIATION DOSE REDUCTION: This exam was performed according to the departmental dose-optimization program which includes automated exposure control, adjustment of the mA and/or kV according to patient size and/or use of iterative reconstruction technique. CONTRAST:  OMNIPAQUE IOHEXOL 300 MG/ML  SOLN COMPARISON:  CT Abdomen and Pelvis 02/02/2021 and earlier. FINDINGS: Lower chest: Negative; minor lung base atelectasis. Hepatobiliary: Negative liver and gallbladder. Pancreas: Negative. Spleen: Negative. Adrenals/Urinary Tract: Negative. Stomach/Bowel: Much of the large bowel is decompressed. Normal retrocecal appendix arising on series 2, image 51. Mild retained stool distally. Negative terminal ileum.  No dilated small bowel. Decompressed stomach and duodenum. No free air, free fluid, or mesenteric inflammation identified. Vascular/Lymphatic: Normal.  No lymphadenopathy identified. Reproductive: Incidental tampon in place. IUD in place, with a rotated configuration of the implant T arms (series 2, image 67). And also positioned abnormally low in the uterus; proximal implant likely at the endocervical canal or cervical os. See sagittal image 68. Ovaries appear negative. Other: No pelvis free fluid. Musculoskeletal: Negative. IMPRESSION: 1. IUD appears positioned abnormally low, and rotated. Recommend GYN consultation. 2. Otherwise negative CT Abdomen and Pelvis. Normal appendix. Electronically Signed   By: Odessa Fleming M.D.   On: 03/18/2022 11:27     Procedures Procedures    Medications Ordered in ED Medications  sodium chloride 0.9 % bolus 500 mL (0 mLs Intravenous Stopped 03/18/22 1245)  iohexol (OMNIPAQUE) 300 MG/ML solution 100 mL (100 mLs Intravenous Contrast Given 03/18/22 1110)  ketorolac (  TORADOL) 30 MG/ML injection 30 mg (30 mg Intravenous Given 03/18/22 1245)  ondansetron (ZOFRAN) injection 4 mg (4 mg Intravenous Given 03/18/22 1245)    ED Course/ Medical Decision Making/ A&P                           Medical Decision Making Patient here for evaluation of vaginal bleeding and lower abdominal pain.  Reports heavy bleeding upon waking.  Bleeding has been associated with sharp pains of her abdomen and intermittent cramping.  States she has saturated 5 tampons since 5 AM.  Some dizziness with standing and nausea.  No fever or vomiting.  On my exam, patient has diffuse tenderness to palpation of the lower abdomen.  There is no guarding.  The abdomen is soft.  Bimanual exam performed, IUD string is present I cannot palpate the device itself.  There is no palpable adnexal masses or tenderness.  Differential would include but not limited to complications from IUD, abnormal uterine bleeding,  pregnancy, at topic, ovarian torsion, tubo-ovarian abscess.  She will need further workup with labs, urinalysis and imaging  Amount and/or Complexity of Data Reviewed Labs: ordered.    Details: Labs interpreted by me, no evidence of leukocytosis, lipase reassuring.  LFTs reassuring, urine pregnancy test negative, quantitative hCG less than 1 urinalysis without evidence of infection Radiology: ordered.    Details: CT abdomen and pelvis was ordered for further evaluation, IUD appears positioned abnormally low and rotated Discussion of management or test interpretation with external provider(s): Discussed findings with on-call OB/GYN, while IUD is abnormally positioned, still sufficient for birth control, but unlikely functional for control of bleeding, it was recommended that patient's start Provera and have close outpatient follow-up with [facility], [facility].  On recheck, patient resting comfortably.  Tolerated oral fluids.  Discussed findings with her and care plan she is agreeable to close outpatient follow-up and return precautions were discussed.  Risk Prescription drug management.           Final Clinical Impression(s) / ED Diagnoses Final diagnoses:  Abnormal uterine bleeding (AUB)    Rx / DC Orders ED Discharge Orders          Ordered    medroxyPROGESTERone (PROVERA) 10 MG tablet  Daily        03/18/22 1422              Pauline Aus, PA-C 03/21/22 1509    Bethann Berkshire, MD 03/23/22 1054

## 2022-03-22 ENCOUNTER — Ambulatory Visit: Payer: 59 | Admitting: Adult Health

## 2022-03-22 ENCOUNTER — Encounter: Payer: Self-pay | Admitting: Adult Health

## 2022-03-22 VITALS — BP 132/81 | HR 84 | Ht 67.0 in | Wt 228.0 lb

## 2022-03-22 DIAGNOSIS — R102 Pelvic and perineal pain: Secondary | ICD-10-CM | POA: Insufficient documentation

## 2022-03-22 DIAGNOSIS — N938 Other specified abnormal uterine and vaginal bleeding: Secondary | ICD-10-CM | POA: Insufficient documentation

## 2022-03-22 DIAGNOSIS — T8332XA Displacement of intrauterine contraceptive device, initial encounter: Secondary | ICD-10-CM | POA: Insufficient documentation

## 2022-03-22 DIAGNOSIS — Z30432 Encounter for removal of intrauterine contraceptive device: Secondary | ICD-10-CM | POA: Diagnosis not present

## 2022-03-22 NOTE — Progress Notes (Addendum)
  Subjective:     Patient ID: Pamela Mccarty, female   DOB: 1995-08-24, 24 y.o.   MRN: [id number]  HPI Pamela Mccarty is a 26 year old white female,with DP, [id number], in requesting IUD removal. Had ED visit 03/18/22 for bleeding since insertion and pelvic pain. CT showed malposition, was low and rotated. GC/CHL was negative. Was prescribed provera and bleeding has stopped.  Last pap was NILM 08/29/20.  Review of Systems For IUD removal  Bleeding for 6 months since IUD insertion and pelvic pain Reviewed past medical,surgical, social and family history. Reviewed medications and allergies.     Objective:   Physical Exam BP 132/81 (BP Location: Left Arm, Patient Position: Sitting, Cuff Size: Normal)   Pulse 84   Ht 5\' 7"  (1.702 m)   Wt 228 lb (103.4 kg)   LMP 03/08/2022   Breastfeeding No   BMI 35.71 kg/m  Consent signed and time out called.   Skin warm and dry.Pelvic: external genitalia is normal in appearance no lesions, vagina: pink, no bleeding today,urethra has no lesions or masses noted, cervix:smooth and bulbous,can see IUD, strings grasped with forceps and asked to cough and IUD easily removed, uterus: normal size, shape and contour, non tender, no masses felt, adnexa: no masses or tenderness noted. Bladder is non tender and no masses felt.   Upstream - 03/22/22 1108       Pregnancy Intention Screening   Does the patient want to become pregnant in the next year? No    Does the patient's partner want to become pregnant in the next year? No    Would the patient like to discuss contraceptive options today? Yes      Contraception Wrap Up   Current Method IUD or IUS    End Method Abstinence    Contraception Counseling Provided No            Examination chaperoned by 03/24/22 LPN  Assessment:     1. Encounter for IUD removal  2. Pelvic pain  3. DUB (dysfunctional uterine bleeding) Bleeding since IUD inserted in June Bleeding stopped with provera  4. Malpositioned  intrauterine device (IUD), initial encounter Seen OCT IUD removed today     Plan:     Follow up prn

## 2022-08-24 IMAGING — CT CT ABD-PELV W/ CM
2 of 4 series · 16 of 46 positions shown, 18 images · IV contrast (omnipaque)
Comparison: Ultrasound 02/02/2021, CT 07/11/2019

CLINICAL DATA: Right lower quadrant pain

EXAM:
CT ABDOMEN AND PELVIS WITH CONTRAST
TECHNIQUE: Multidetector CT imaging of the abdomen and pelvis was performed
using the standard protocol following bolus administration of
intravenous contrast.
CONTRAST:  100mL OMNIPAQUE IOHEXOL 350 MG/ML SOLN

[Series 3: a/p w/ 5mm · axial · 0.91mm/px · z∈[+1016,+1431]mm · 13 of 91 slices shown, 15 images]
[im 4/91  soft-tissue]
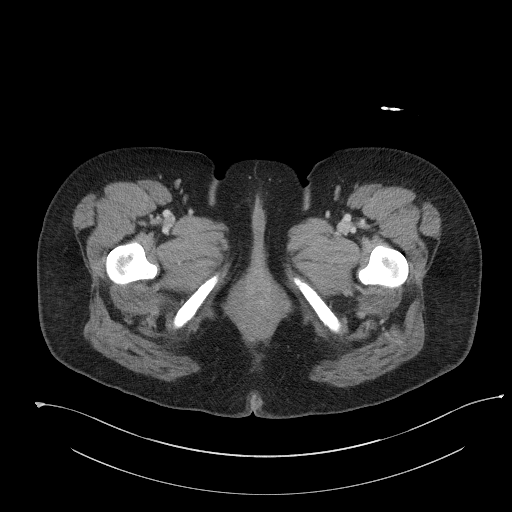
[im 4/91  bone]
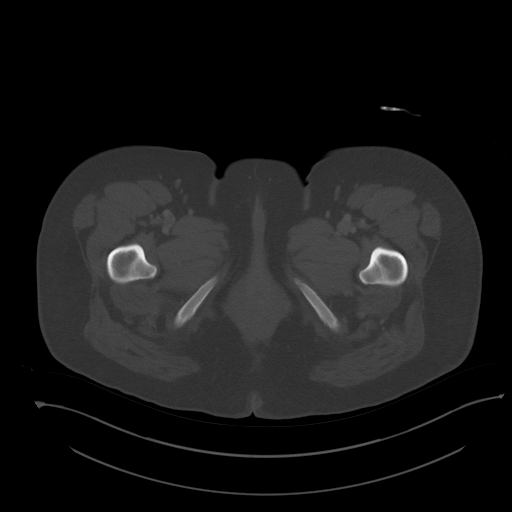
[im 12/91  soft-tissue]
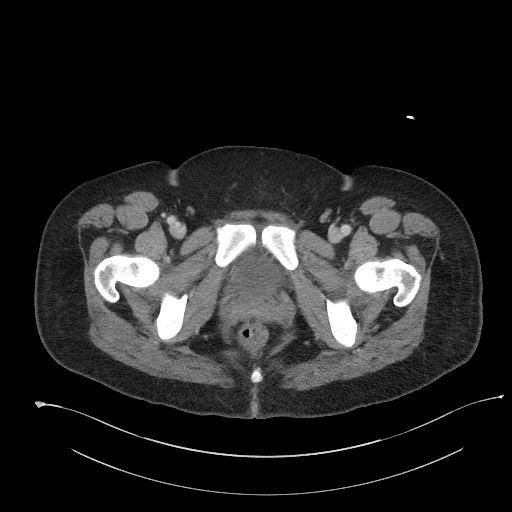
[im 19/91  soft-tissue]
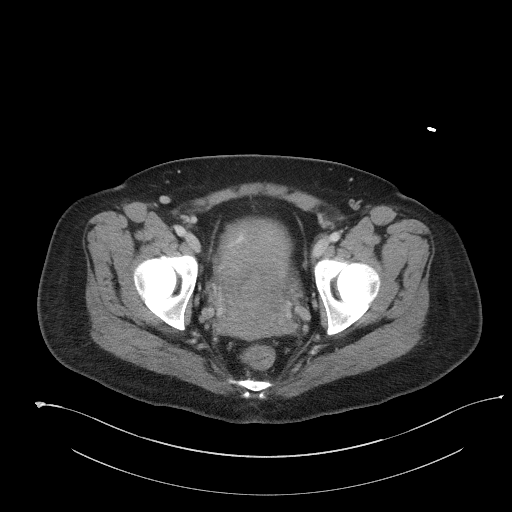
[im 27/91  soft-tissue]
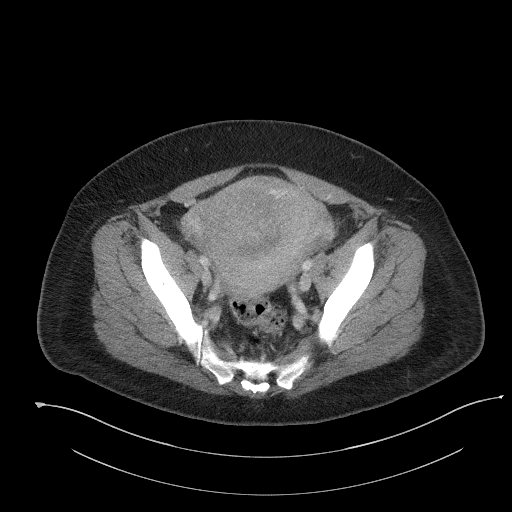
[im 31/91  soft-tissue]
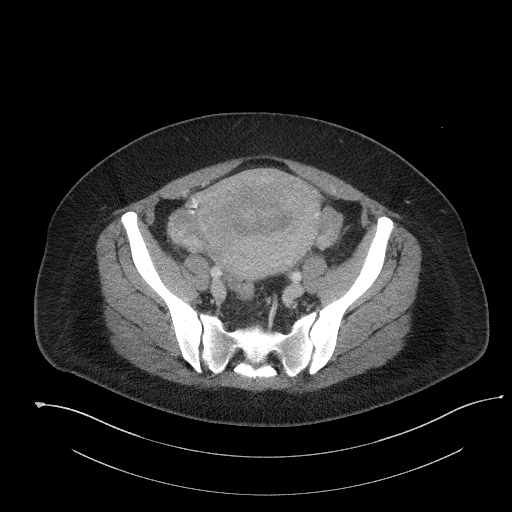
[im 38/91  soft-tissue]
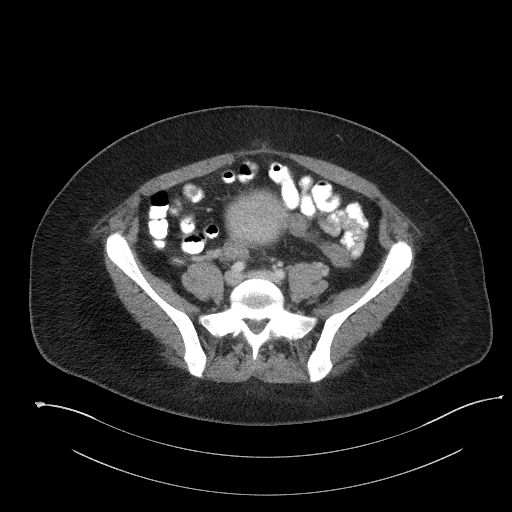
[im 46/91  soft-tissue]
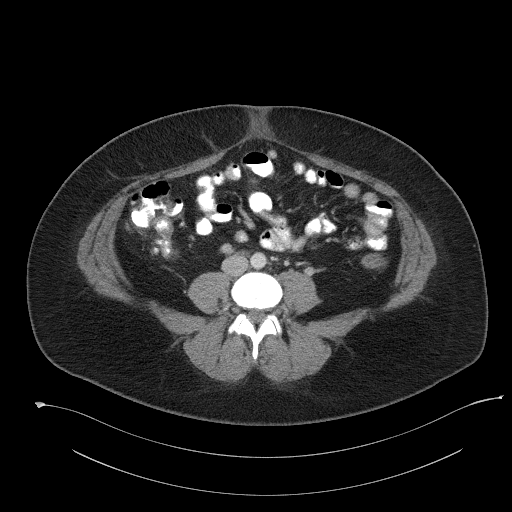
[im 53/91  soft-tissue]
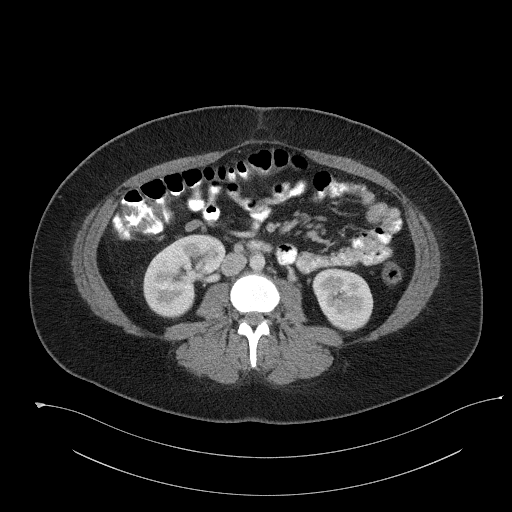
[im 61/91  soft-tissue]
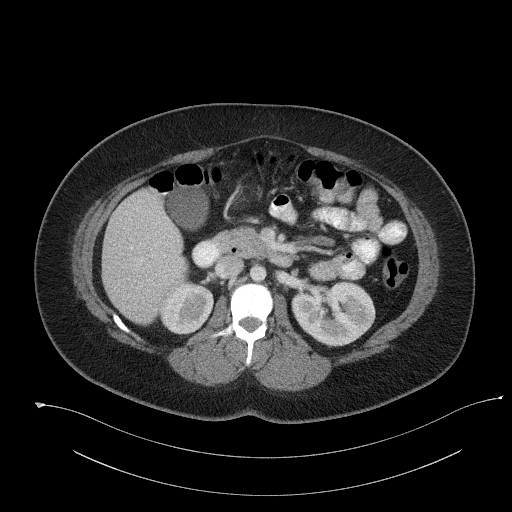
[im 61/91  bone]
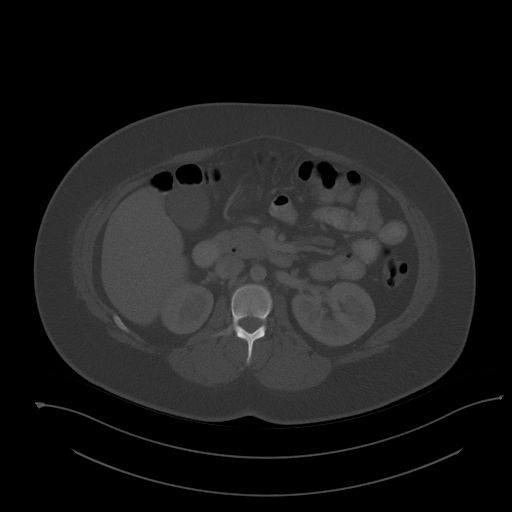
[im 64/91  soft-tissue]
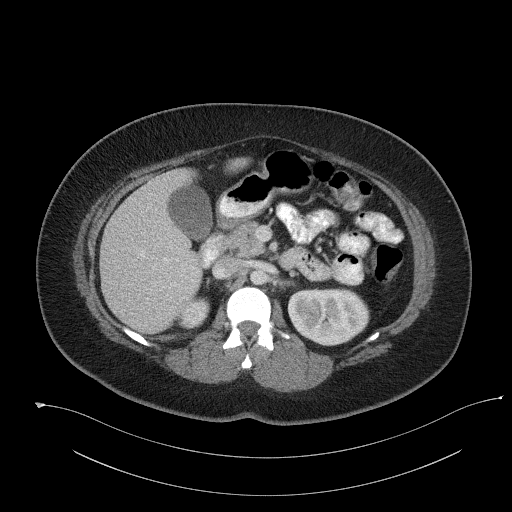
[im 72/91  soft-tissue]
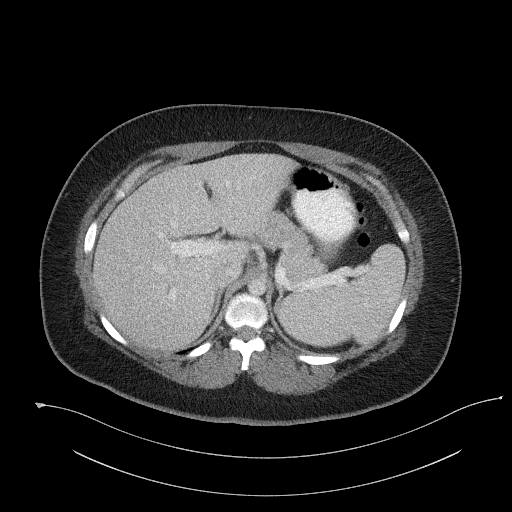
[im 79/91  soft-tissue]
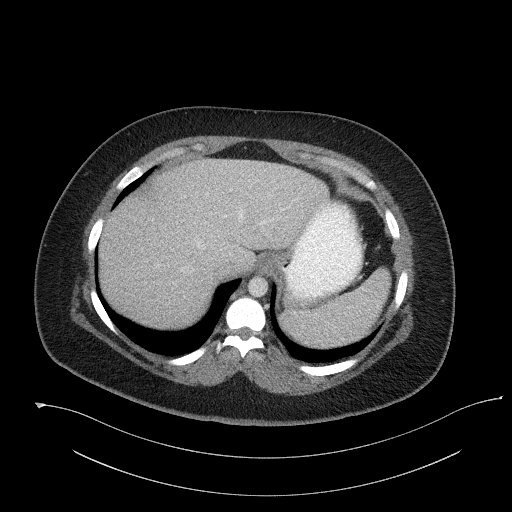
[im 87/91  soft-tissue]
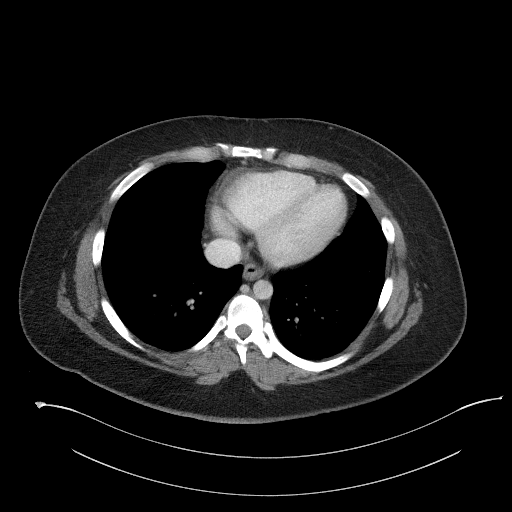

[Series 6: a/p w/ cor · coronal · 0.89mm/px · 3 of 170 slices shown]
[im 57/170  soft-tissue]
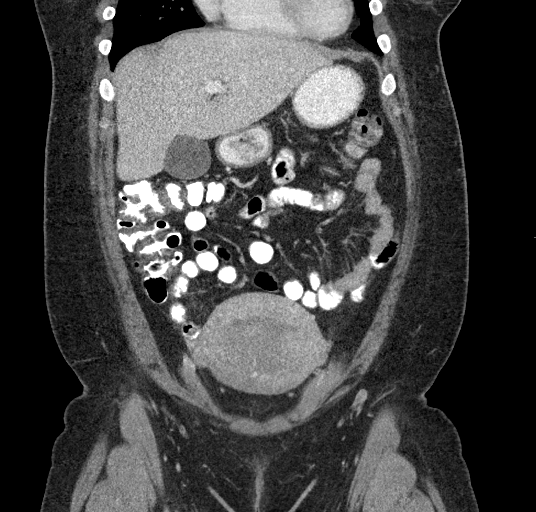
[im 76/170  soft-tissue]
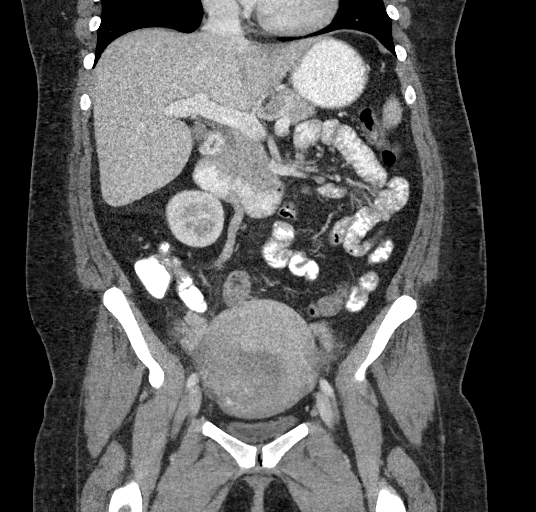
[im 94/170  soft-tissue]
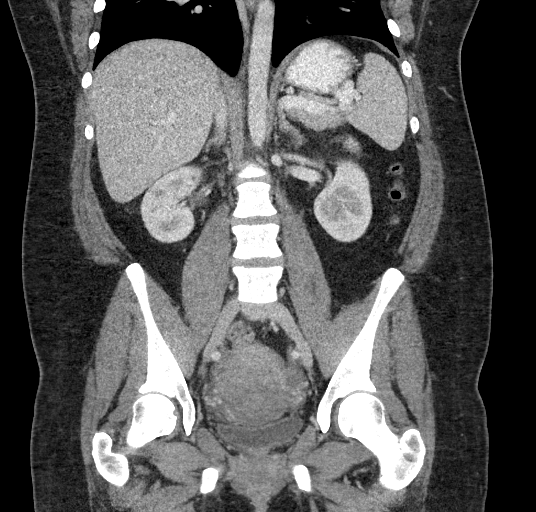

[16 of 46 positions shown; findings below may reference images not displayed]

FINDINGS: Lower chest: No acute abnormality.

Hepatobiliary: No focal liver abnormality is seen. No gallstones,
gallbladder wall thickening, or biliary dilatation.

Pancreas: Unremarkable. No pancreatic ductal dilatation or
surrounding inflammatory changes.

Spleen: Normal in size without focal abnormality.

Adrenals/Urinary Tract: Adrenal glands are unremarkable. Kidneys are
normal, without renal calculi, focal lesion, or hydronephrosis.
Bladder is unremarkable.

Stomach/Bowel: Stomach is within normal limits. Appendix appears
normal. No evidence of bowel wall thickening, distention, or
inflammatory changes.

Vascular/Lymphatic: No significant vascular findings are present. No
enlarged abdominal or pelvic lymph nodes.

Reproductive: Uterus is enlarged consistent with recent postpartum
status. Prominent enhancing myometrial and parauterine vessels also
likely due to recent pregnancy. No definite extravasation into the
myometrial cavity. Small amount of hyperdense products in the
endometrial canal likely represents clot or hemorrhagic material.

Other: Negative for pelvic effusion or free air

Musculoskeletal: No acute or significant osseous findings.
IMPRESSION: 1. Negative for acute appendicitis.
2. Enlarged postpartum uterus. Mild hyperdense material in the lower
uterine segment and cervix likely represents clotted blood product.
There is hyperemia of the myometrium presumably due to recent
pregnancy status, no definitive extravasation is seen at this time

## 2022-08-24 IMAGING — US US ABDOMEN LIMITED
1 series · 15 of 25 positions shown · non-contrast
Comparison: Abdominopelvic CT 07/11/2019

CLINICAL DATA: Abdominal pain and vomiting.  2 days postpartum.

EXAM:
ULTRASOUND ABDOMEN LIMITED RIGHT UPPER QUADRANT

[Series 1: us abdomen limited · 15 of 41 slices shown]
[im 1/41]
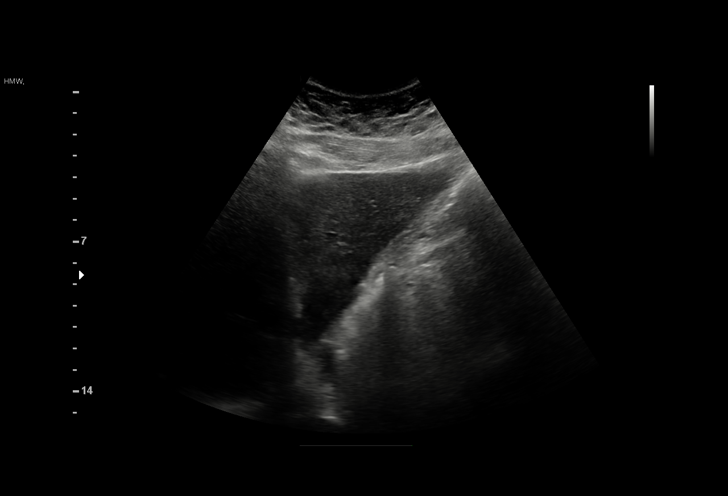
[im 4/41]
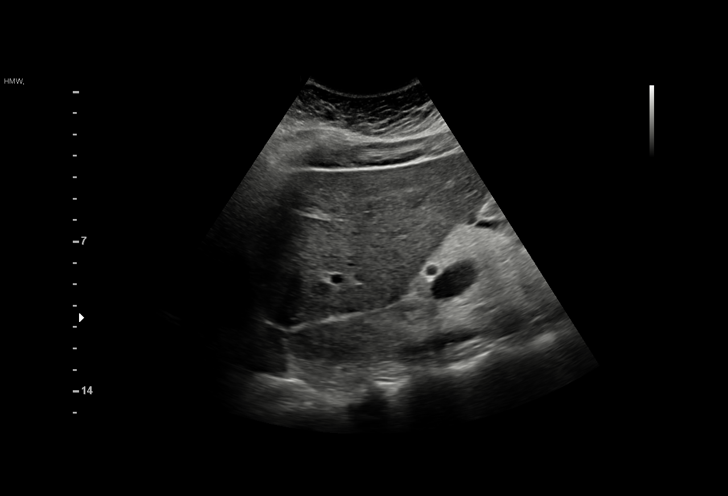
[im 7/41]
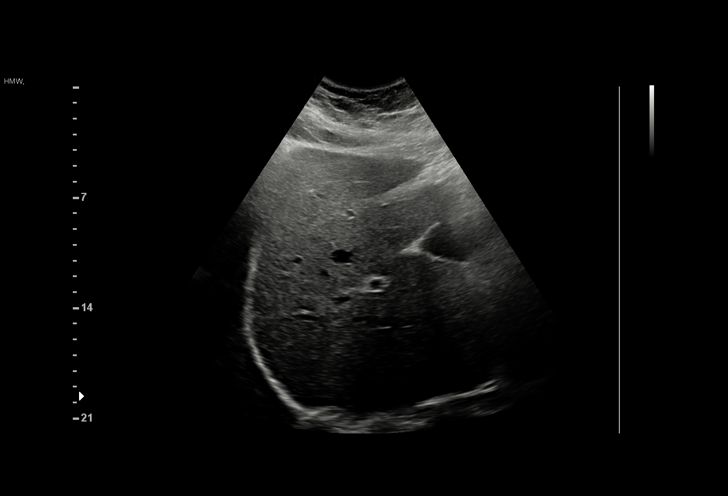
[im 9/41]
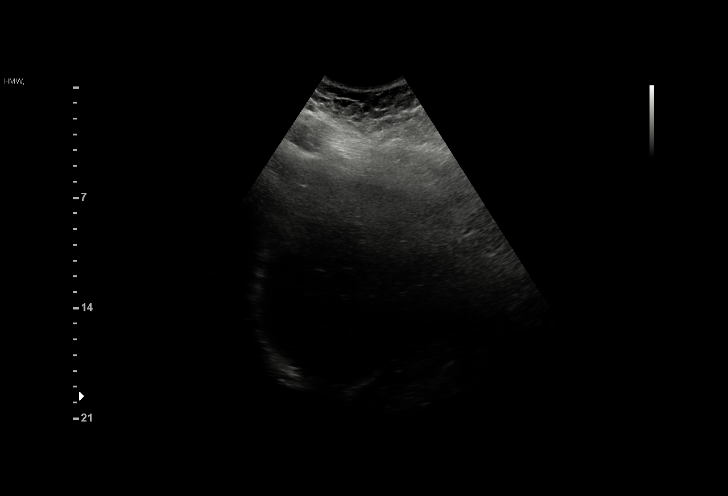
[im 12/41]
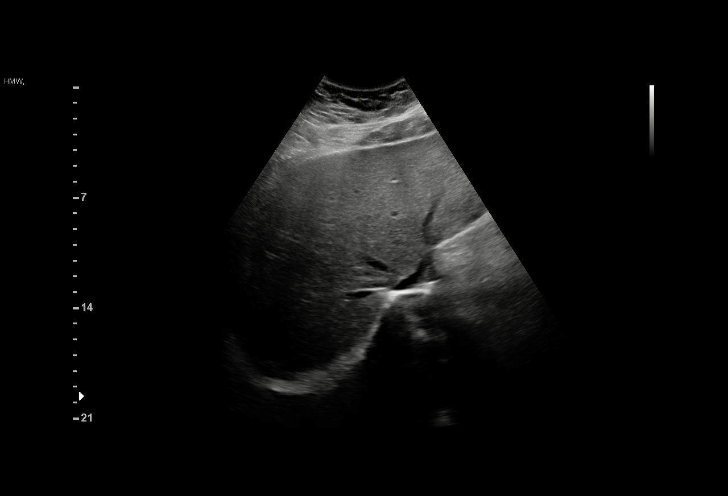
[im 16/41]
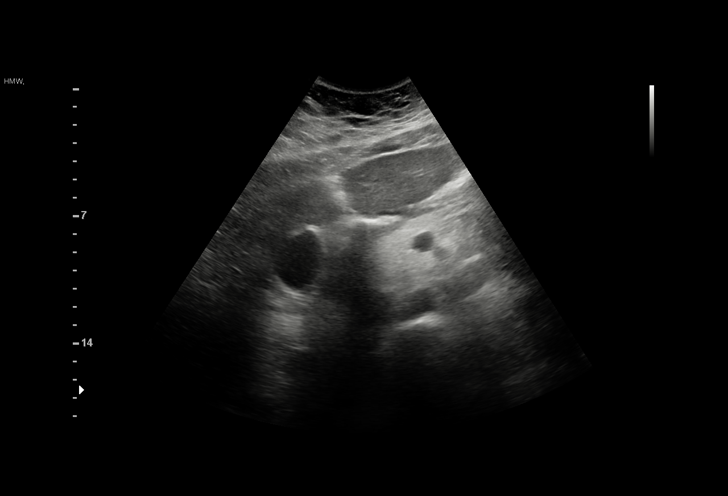
[im 17/41]
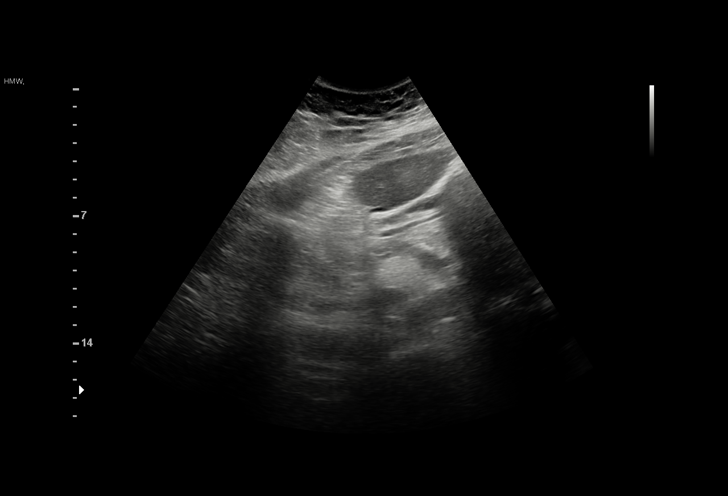
[im 21/41]
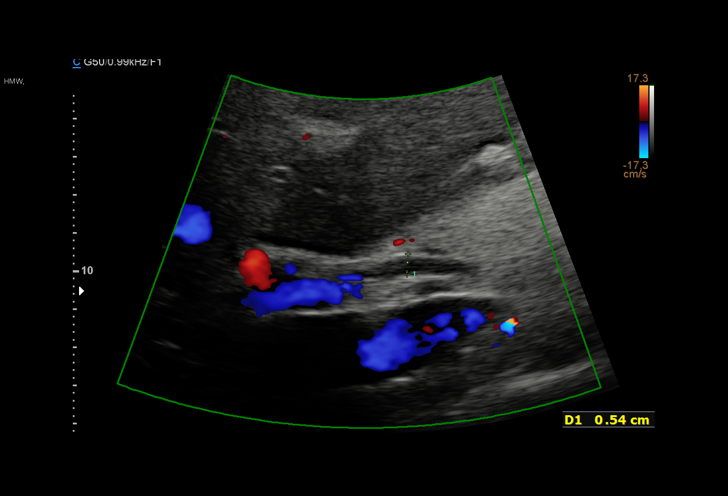
[im 24/41]
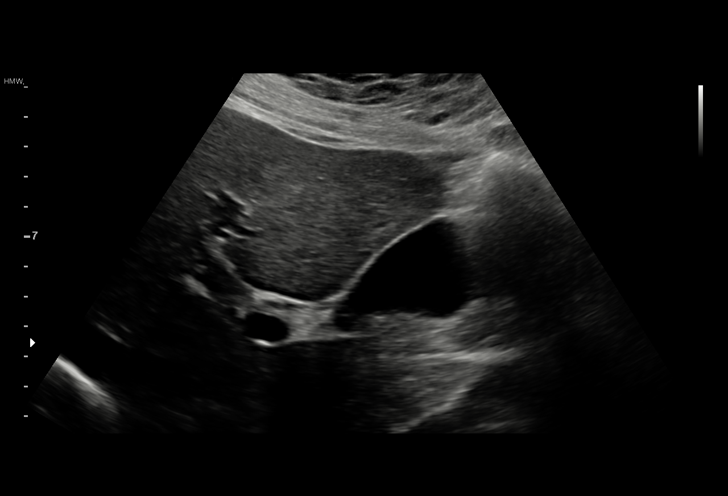
[im 26/41]
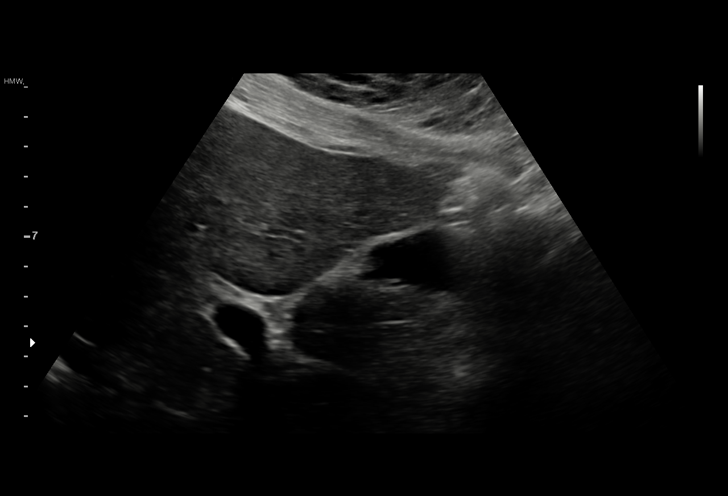
[im 29/41]
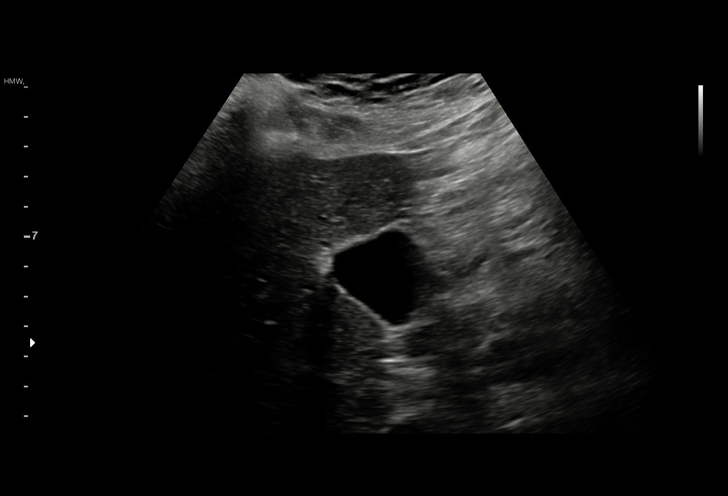
[im 32/41]
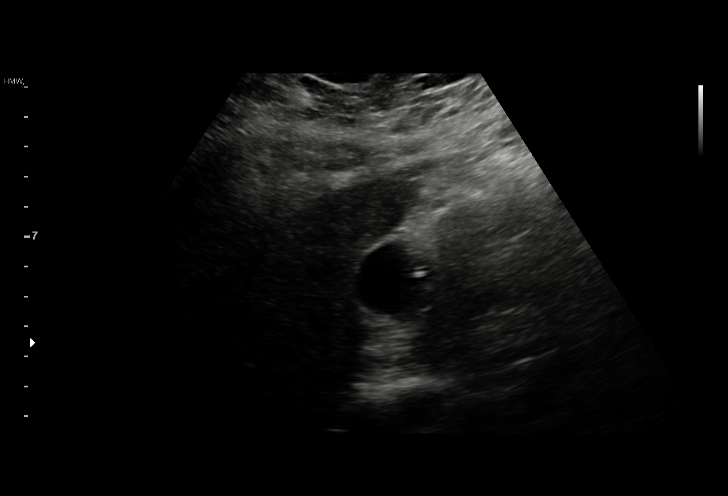
[im 34/41]
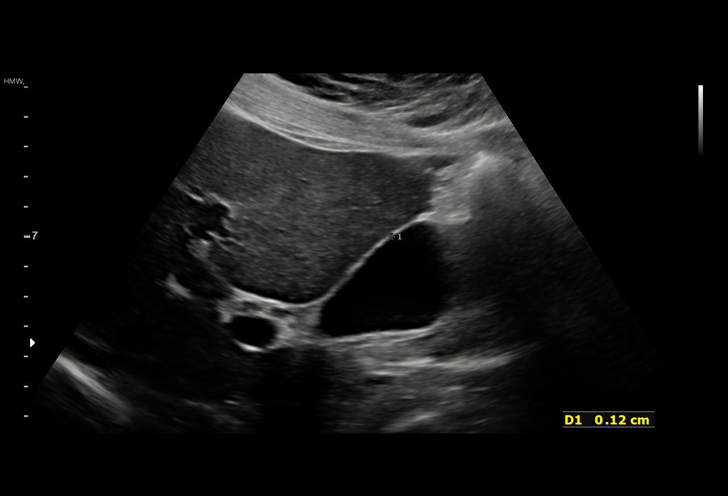
[im 37/41]
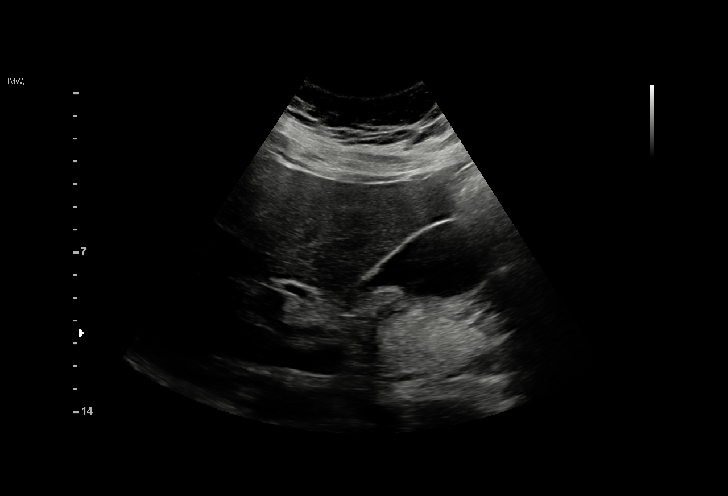
[im 41/41]
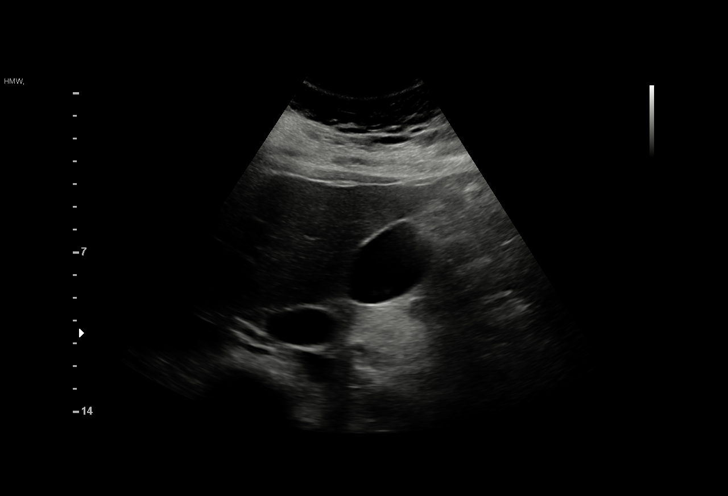

[15 of 25 positions shown; findings below may reference images not displayed]

FINDINGS: Gallbladder:

Physiologically distended. No gallstones or wall thickening
visualized. No sonographic Murphy sign noted by sonographer.

Common bile duct:

Diameter: 5 mm, normal.

Liver:

No focal lesion identified. Within normal limits in parenchymal
echogenicity. Portal vein is patent on color Doppler imaging with
normal direction of blood flow towards the liver.

Other: No right upper quadrant ascites.
IMPRESSION: Unremarkable right upper quadrant ultrasound. No gallstones or
biliary dilatation.

## 2022-08-24 IMAGING — US US PELVIS COMPLETE
1 series · 15 of 25 positions shown · non-contrast
Comparison: 11/19/2015, obstetrical sonogram 11/01/2020

CLINICAL DATA: Postpartum pelvic pain.  Unknown LMP.

EXAM:
TRANSABDOMINAL ULTRASOUND OF PELVIS
TECHNIQUE: Transabdominal ultrasound examination of the pelvis was performed
including evaluation of the uterus, ovaries, adnexal regions, and
pelvic cul-de-sac.

[Series 1: us pelvis complete · 15 of 38 slices shown]
[im 1/38]
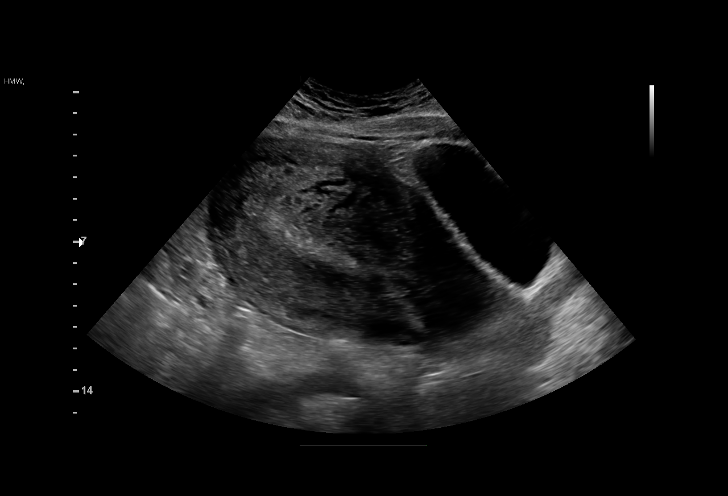
[im 4/38]
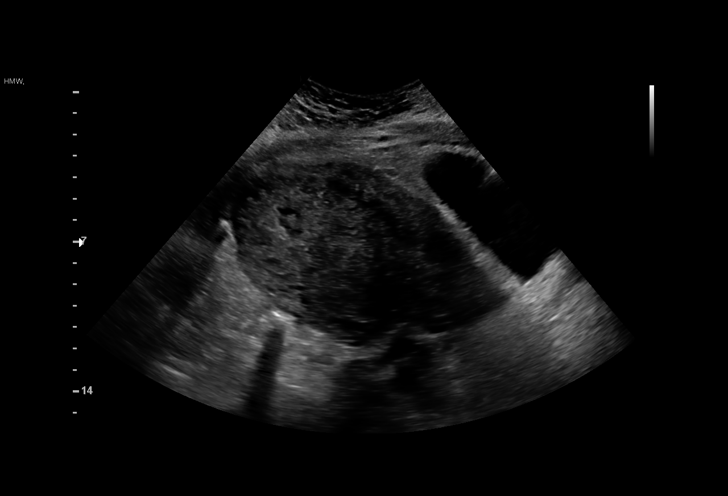
[im 7/38]
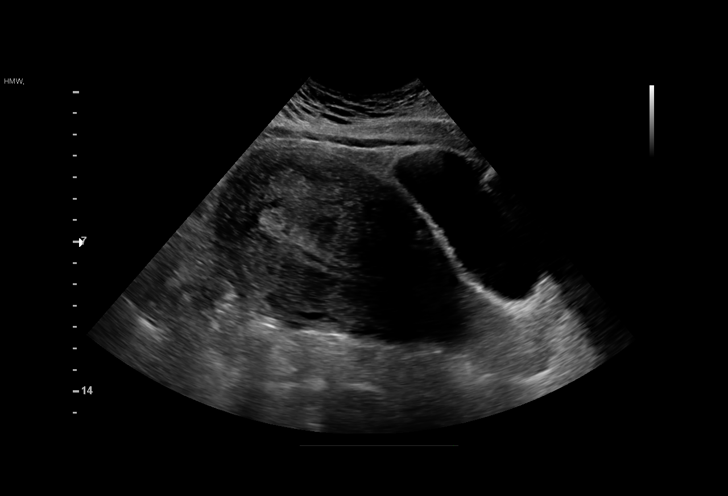
[im 8/38]
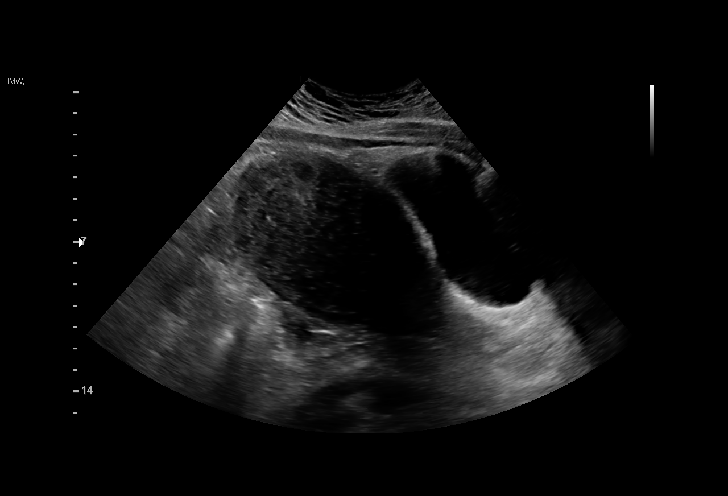
[im 11/38]
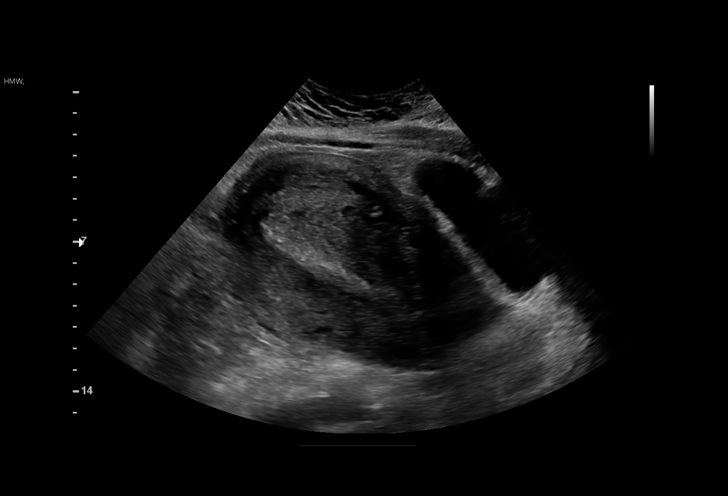
[im 14/38]
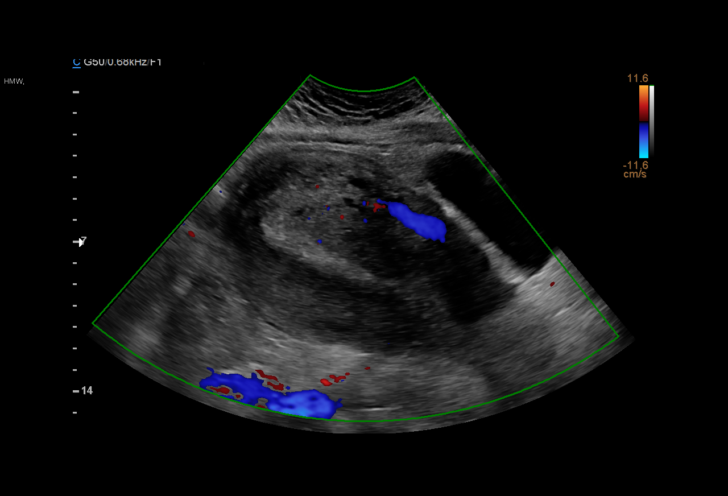
[im 16/38]
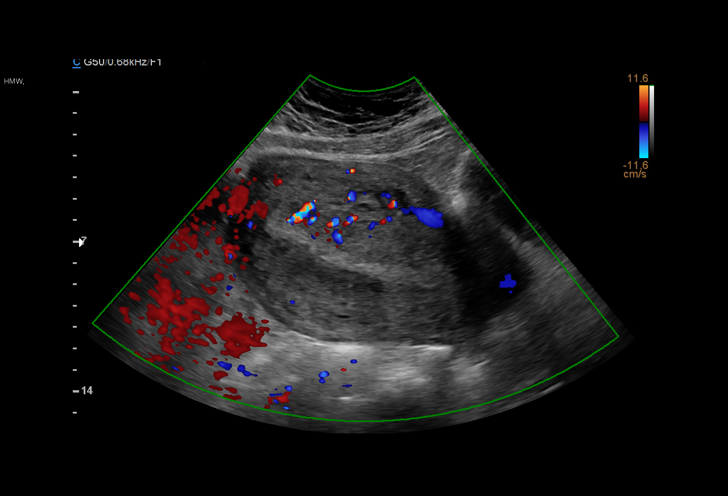
[im 19/38]
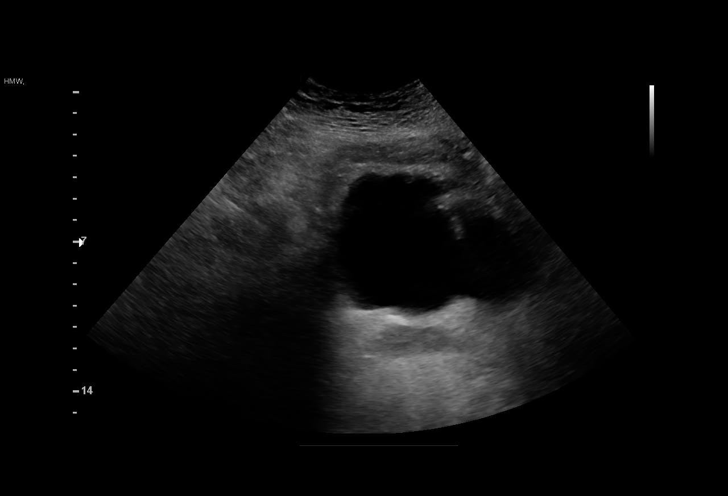
[im 22/38]
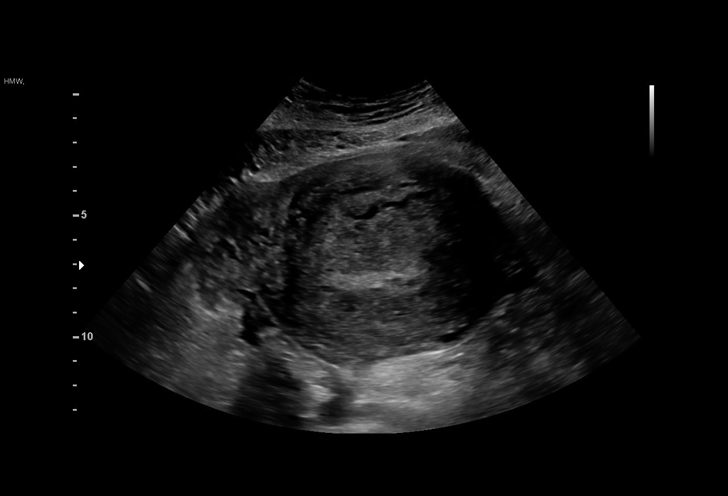
[im 24/38]
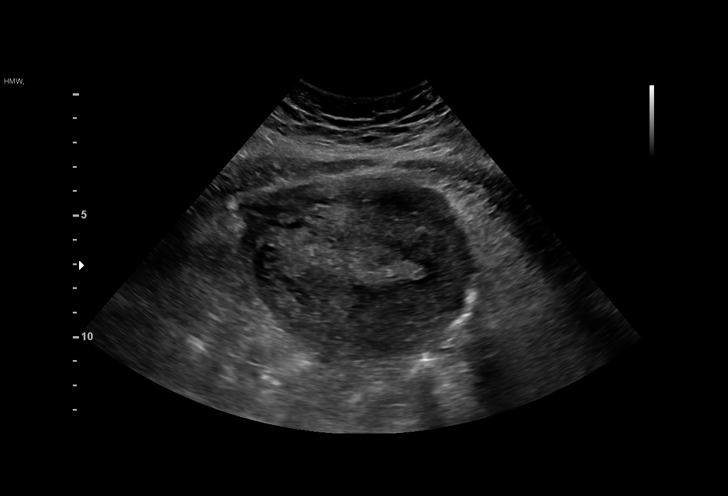
[im 27/38]
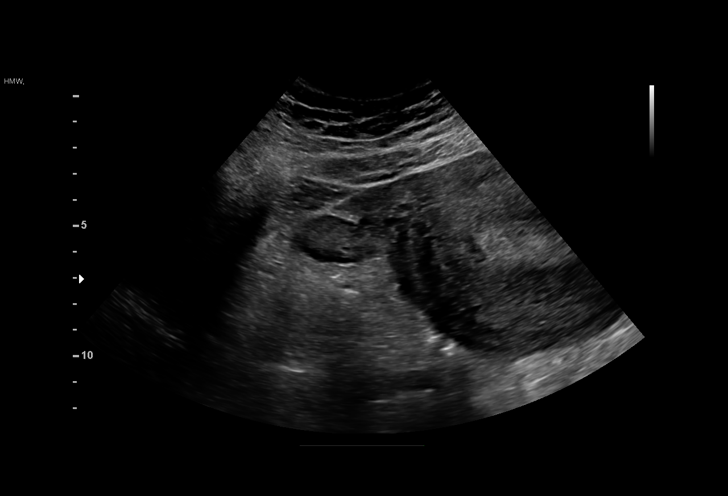
[im 30/38]
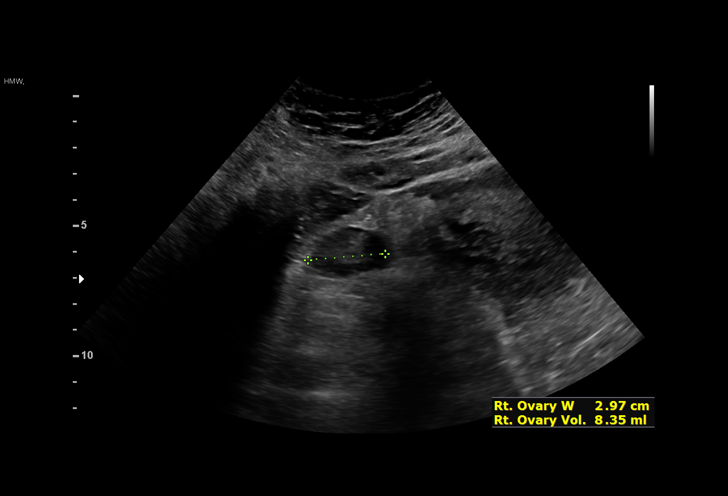
[im 31/38]
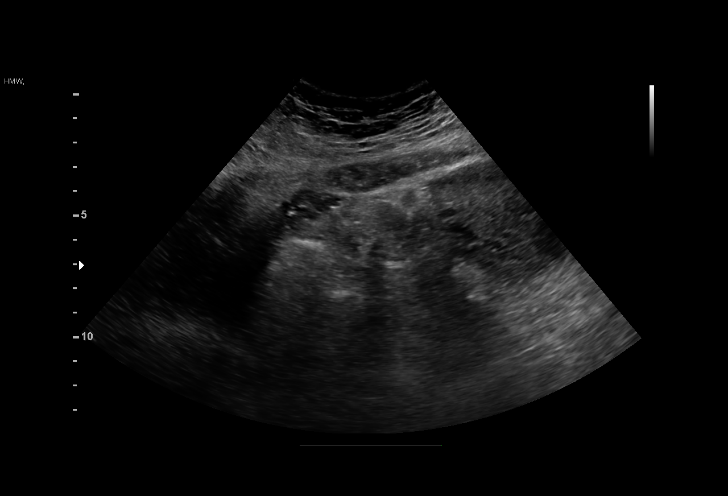
[im 34/38]
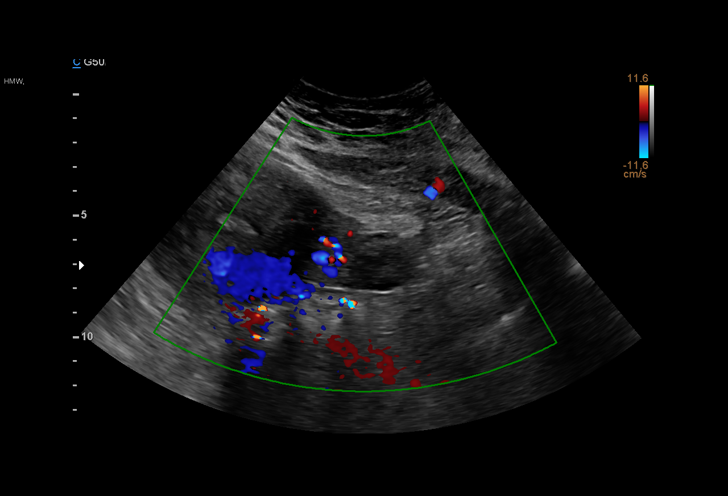
[im 38/38]
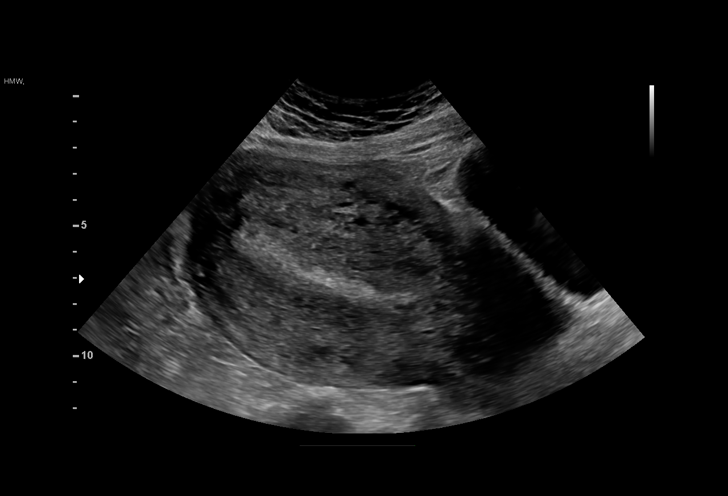

[15 of 25 positions shown; findings below may reference images not displayed]

FINDINGS: Uterus

Measurements: 17.3 x 9.0 x 10.6 cm = volume: 866 mL. No fibroids or
other mass visualized.

Endometrium

Thickness: 14.  No focal abnormality visualized.

Right ovary

Measurements: 2.7 x 1.9 x 3.0 cm = volume: 8 mL. Normal
appearance/no adnexal mass.

Left ovary

Measurements: 3.0 x 1.6 x 2.4 cm = volume: 6 mL. Normal
appearance/no adnexal mass.

Other findings:  No abnormal free fluid.
IMPRESSION: Normal postpartum pelvic sonogram

## 2022-11-22 ENCOUNTER — Other Ambulatory Visit: Payer: Self-pay

## 2022-11-22 ENCOUNTER — Emergency Department (HOSPITAL_COMMUNITY): Payer: Self-pay

## 2022-11-22 ENCOUNTER — Emergency Department (HOSPITAL_COMMUNITY)
Admission: EM | Admit: 2022-11-22 | Discharge: 2022-11-22 | Disposition: A | Discharge status: Home / Self Care | Payer: Self-pay | Attending: Emergency Medicine | Admitting: Emergency Medicine

## 2022-11-22 DIAGNOSIS — S301XXA Contusion of abdominal wall, initial encounter: Secondary | ICD-10-CM | POA: Insufficient documentation

## 2022-11-22 DIAGNOSIS — Z91010 Allergy to peanuts: Secondary | ICD-10-CM | POA: Insufficient documentation

## 2022-11-22 DIAGNOSIS — S20212A Contusion of left front wall of thorax, initial encounter: Secondary | ICD-10-CM | POA: Insufficient documentation

## 2022-11-22 DIAGNOSIS — Z91040 Latex allergy status: Secondary | ICD-10-CM | POA: Insufficient documentation

## 2022-11-22 DIAGNOSIS — W109XXA Fall (on) (from) unspecified stairs and steps, initial encounter: Secondary | ICD-10-CM | POA: Insufficient documentation

## 2022-11-22 DIAGNOSIS — W19XXXA Unspecified fall, initial encounter: Secondary | ICD-10-CM

## 2022-11-22 LAB — COMPREHENSIVE METABOLIC PANEL
ALT: 18 U/L (ref 0–44)
AST: 17 U/L (ref 15–41)
Albumin: 3.8 g/dL (ref 3.5–5.0)
Alkaline Phosphatase: 64 U/L (ref 38–126)
Anion gap: 8 (ref 5–15)
BUN: 14 mg/dL (ref 6–20)
CO2: 25 mmol/L (ref 22–32)
Calcium: 8.9 mg/dL (ref 8.9–10.3)
Chloride: 106 mmol/L (ref 98–111)
Creatinine, Ser: 0.87 mg/dL (ref 0.44–1.00)
GFR, Estimated: >60 mL/min (ref >60)
Glucose, Bld: 105 mg/dL — ABNORMAL HIGH (ref 70–99)
Potassium: 4.1 mmol/L (ref 3.5–5.1)
Sodium: 139 mmol/L (ref 135–145)
Total Bilirubin: 0.5 mg/dL (ref 0.3–1.2)
Total Protein: 6.6 g/dL (ref 6.5–8.1)

## 2022-11-22 LAB — CBC WITH DIFFERENTIAL/PLATELET
Abs Immature Granulocytes: 0.02 10*3/uL (ref 0.00–0.07)
Basophils Absolute: 0 10*3/uL (ref 0.0–0.1)
Basophils Relative: 0 %
Eosinophils Absolute: 0.1 10*3/uL (ref 0.0–0.5)
Eosinophils Relative: 2 %
HCT: 44.1 % (ref 36.0–46.0)
Hemoglobin: 14.9 g/dL (ref 12.0–15.0)
Immature Granulocytes: 0 %
Lymphocytes Relative: 33 %
Lymphs Abs: 2.4 10*3/uL (ref 0.7–4.0)
MCH: 30.5 pg (ref 26.0–34.0)
MCHC: 33.8 g/dL (ref 30.0–36.0)
MCV: 90.2 fL (ref 80.0–100.0)
Monocytes Absolute: 0.6 10*3/uL (ref 0.1–1.0)
Monocytes Relative: 8 %
Neutro Abs: 4.1 10*3/uL (ref 1.7–7.7)
Neutrophils Relative %: 57 %
Platelets: 263 10*3/uL (ref 150–400)
RBC: 4.89 MIL/uL (ref 3.87–5.11)
RDW: 11.9 % (ref 11.5–15.5)
WBC: 7.2 10*3/uL (ref 4.0–10.5)
nRBC: 0 % (ref 0.0–0.2)

## 2022-11-22 LAB — HCG, QUANTITATIVE, PREGNANCY: hCG, Beta Chain, Quant, S: <1 m[IU]/mL (ref <5)

## 2022-11-22 MED ORDER — IOHEXOL 300 MG/ML  SOLN
100.0000 mL | Freq: Once | INTRAMUSCULAR | Status: AC | PRN
  Administered 2022-11-22: 100 mL via INTRAVENOUS

## 2022-11-22 MED ORDER — HYDROMORPHONE HCL 1 MG/ML IJ SOLN
0.5000 mg | Freq: Once | INTRAMUSCULAR | Status: AC
  Administered 2022-11-22: 0.5 mg via INTRAVENOUS
  Filled 2022-11-22: qty 0.5

## 2022-11-22 MED ORDER — TRAMADOL HCL 50 MG PO TABS: ORAL_TABLET | ORAL | 0 refills | Status: DC

## 2022-11-22 MED ORDER — ONDANSETRON HCL 4 MG/2ML IJ SOLN
4.0000 mg | Freq: Once | INTRAMUSCULAR | Status: AC
  Administered 2022-11-22: 4 mg via INTRAVENOUS
  Filled 2022-11-22: qty 2

## 2022-11-22 MED ORDER — SODIUM CHLORIDE 0.9 % IV BOLUS
500.0000 mL | Freq: Once | INTRAVENOUS | Status: AC
  Administered 2022-11-22: 500 mL via INTRAVENOUS

## 2022-11-22 NOTE — ED Triage Notes (Signed)
Pt fell down 5 steps 2 days ago and landed on left side on wood. Pt denies any LOC, did not hit head. Pt states pain worse today also vomited blood tinged emesis 4 times today.hurts worse to take a deep breath.

## 2022-11-22 NOTE — Discharge Instructions (Signed)
Follow-up with your family doctor if any problems 

## 2022-11-23 NOTE — ED Provider Notes (Signed)
[place] EMERGENCY DEPARTMENT AT [facility] Provider Note   CSN: [id number] Arrival date & time: 11/22/22  1820     History  Chief Complaint  Patient presents with   Pamela Mccarty Pamela Mccarty is a 27 y.o. female.  2 days ago the patient states she fell down 5 steps and hit her left side.  Patient has a history of obesity.  She had no other medical problems  The history is provided by the patient and medical records. No language interpreter was used.  Fall This is a new problem. The current episode started 2 days ago. The problem occurs constantly. The problem has not changed since onset.Associated symptoms include chest pain and abdominal pain. Pertinent negatives include no headaches. The symptoms are aggravated by bending. Nothing relieves the symptoms. She has tried nothing for the symptoms.       Home Medications Prior to Admission medications   Medication Sig Start Date End Date Taking? Authorizing Provider  traMADol (ULTRAM) 50 MG tablet Take 1 every 6-8 hours for pain not relieved by Tylenol or Motrin 11/22/22  Yes Pamela Berkshire, MD  acetaminophen (TYLENOL) 500 MG tablet Take 500 mg by mouth every 4 (four) hours as needed for moderate pain or fever. Patient not taking: Reported on 03/22/2022    [provider]  medroxyPROGESTERone (PROVERA) 10 MG tablet Take 1 tablet (10 mg total) by mouth daily. 03/18/22   Triplett, Tammy, PA-C  ondansetron (ZOFRAN) 4 MG tablet Take 1 tablet (4 mg total) by mouth every 6 (six) hours. Patient not taking: Reported on 03/18/2022 10/03/21   Eber Hong, MD      Allergies    Peanut-containing drug products, Latex, Soy allergy, and Tomato    Review of Systems   Review of Systems  Constitutional:  Negative for appetite change and fatigue.  HENT:  Negative for congestion, ear discharge and sinus pressure.   Eyes:  Negative for discharge.  Respiratory:  Negative for cough.   Cardiovascular:  Positive for chest  pain.  Gastrointestinal:  Positive for abdominal pain. Negative for diarrhea.  Genitourinary:  Negative for frequency and hematuria.  Musculoskeletal:  Negative for back pain.  Skin:  Negative for rash.  Neurological:  Negative for seizures and headaches.  Psychiatric/Behavioral:  Negative for hallucinations.     Physical Exam Updated Vital Signs BP 132/74 (BP Location: Right Arm)   Pulse 88   Temp 98.3 F (36.8 C) (Oral)   Resp 17   Ht 5\' 5"  (1.651 m)   SpO2 98%   BMI 37.94 kg/m  Physical Exam Vitals and nursing note reviewed.  Constitutional:      Appearance: She is well-developed.  HENT:     Head: Normocephalic.     Nose: Nose normal.  Eyes:     General: No scleral icterus.    Conjunctiva/sclera: Conjunctivae normal.  Neck:     Thyroid: No thyromegaly.  Cardiovascular:     Rate and Rhythm: Normal rate and regular rhythm.     Heart sounds: No murmur heard.    No friction rub. No gallop.  Pulmonary:     Breath sounds: No stridor. No wheezing or rales.  Chest:     Chest wall: Tenderness present.  Abdominal:     General: There is no distension.     Tenderness: There is abdominal tenderness. There is no rebound.  Musculoskeletal:        General: Normal range of motion.     Cervical  back: Neck supple.  Lymphadenopathy:     Cervical: No cervical adenopathy.  Skin:    Findings: No erythema or rash.  Neurological:     Mental Status: She is alert and oriented to person, place, and time.     Motor: No abnormal muscle tone.     Coordination: Coordination normal.  Psychiatric:        Behavior: Behavior normal.     ED Results / Procedures / Treatments   Labs (all labs ordered are listed, but only abnormal results are displayed) Labs Reviewed  COMPREHENSIVE METABOLIC PANEL - Abnormal; Notable for the following components:      Result Value   Glucose, Bld 105 (*)    All other components within normal limits  CBC WITH DIFFERENTIAL/PLATELET  HCG, QUANTITATIVE,  PREGNANCY    EKG None  Radiology CT ABDOMEN PELVIS W CONTRAST  Result Date: 11/22/2022 CLINICAL DATA:  Larey Seat down 5 steps 2 days ago and landed on left side on wood. Blunt abdominal trauma. Worsening pain today and vomiting blood-tinged emesis 4 times. EXAM: CT ABDOMEN AND PELVIS WITH CONTRAST TECHNIQUE: Multidetector CT imaging of the abdomen and pelvis was performed using the standard protocol following bolus administration of intravenous contrast. RADIATION DOSE REDUCTION: This exam was performed according to the departmental dose-optimization program which includes automated exposure control, adjustment of the mA and/or kV according to patient size and/or use of iterative reconstruction technique. CONTRAST:  OMNIPAQUE IOHEXOL 300 MG/ML  SOLN COMPARISON:  CT abdomen and pelvis 03/18/2022 FINDINGS: Lower chest: No acute abnormality. Hepatobiliary: No hepatic injury or perihepatic hematoma. Gallbladder is unremarkable. Pancreas: Unremarkable. Spleen: No splenic injury or perisplenic hematoma. Adrenals/Urinary Tract: Normal adrenal glands. No urinary calculi or hydronephrosis. No acute renal injury. Unremarkable bladder. Stomach/Bowel: Normal caliber large and small bowel. No bowel wall thickening. Normal appendix. Stomach is within normal limits. Vascular/Lymphatic: No acute vascular abnormality. No lymphadenopathy. Reproductive: Uterus and bilateral adnexa are unremarkable. Other: No free intraperitoneal fluid or air. Musculoskeletal: No acute fracture. IMPRESSION: No acute traumatic injury in the abdomen or pelvis. Electronically Signed   By: Minerva Fester M.D.   On: 11/22/2022 20:28   DG Ribs Unilateral W/Chest Left  Result Date: 11/22/2022 CLINICAL DATA:  Larey Seat down stairs, left-sided rib pain EXAM: LEFT RIBS AND CHEST - 3+ VIEW COMPARISON:  06/11/2021 FINDINGS: No displaced fracture or other bone lesions are seen involving the ribs. There is no evidence of pneumothorax or pleural effusion.  Both lungs are clear. Heart size and mediastinal contours are within normal limits. IMPRESSION: No displaced fracture of the left ribs. No acute abnormality of the lungs. Electronically Signed   By: Jearld Lesch M.D.   On: 11/22/2022 19:07    Procedures Procedures    Medications Ordered in ED Medications  sodium chloride 0.9 % bolus 500 mL (0 mLs Intravenous Stopped 11/22/22 2004)  HYDROmorphone (DILAUDID) injection 0.5 mg (0.5 mg Intravenous Given 11/22/22 1929)  ondansetron (ZOFRAN) injection 4 mg (4 mg Intravenous Given 11/22/22 1930)  iohexol (OMNIPAQUE) 300 MG/ML solution 100 mL (100 mLs Intravenous Contrast Given 11/22/22 2011)    ED Course/ Medical Decision Making/ A&P                                 Medical Decision Making Amount and/or Complexity of Data Reviewed Labs: ordered. Radiology: ordered.  Risk Prescription drug management.  This patient presents to the ED for concern of fall with  chest and abdomen pain, this involves an extensive number of treatment options, and is a complaint that carries with it a high risk of complications and morbidity.  The differential diagnosis includes fractured rib, spleen laceration   Co morbidities that complicate the patient evaluation  Obesity   Additional history obtained:  Additional history obtained from patient External records from outside source obtained and reviewed including full records   Lab Tests:  I Ordered, and personally interpreted labs.  The pertinent results include: CBC and chemistries which were unremarkable   Imaging Studies ordered:  I ordered imaging studies including CT of the abdomen and rib series I independently visualized and interpreted imaging which showed no I agree with the radiologist interpretation   Cardiac Monitoring: / EKG:  The patient was maintained on a cardiac monitor.  I personally viewed and interpreted the cardiac monitored which showed an underlying rhythm of: Normal sinus  rhythm   Consultations Obtained:  No consultant   Problem List / ED Course / Critical interventions / Medication management  Fall, obesity I ordered medication including Dilaudid for pain Reevaluation of the patient after these medicines showed that the patient improved I have reviewed the patients home medicines and have made adjustments as needed   Social Determinants of Health:  None   Test / Admission - Considered:  None  Patient fell down 5 steps and has a contusion to her left lower rib and left upper quadrant of.  Patient given some tramadol for pain        Final Clinical Impression(s) / ED Diagnoses Final diagnoses:  Fall, initial encounter    Rx / DC Orders ED Discharge Orders          Ordered    traMADol (ULTRAM) 50 MG tablet        11/22/22 2315              Pamela Berkshire, MD 11/23/22 1238

## 2022-12-20 ENCOUNTER — Emergency Department (HOSPITAL_COMMUNITY): Payer: Self-pay

## 2022-12-20 ENCOUNTER — Encounter (HOSPITAL_COMMUNITY): Payer: Self-pay

## 2022-12-20 ENCOUNTER — Other Ambulatory Visit: Payer: Self-pay

## 2022-12-20 ENCOUNTER — Emergency Department (HOSPITAL_COMMUNITY)
Admission: EM | Admit: 2022-12-20 | Discharge: 2022-12-20 | Disposition: A | Discharge status: Home / Self Care | Payer: Self-pay | Attending: Emergency Medicine | Admitting: Emergency Medicine

## 2022-12-20 DIAGNOSIS — Z91040 Latex allergy status: Secondary | ICD-10-CM | POA: Insufficient documentation

## 2022-12-20 DIAGNOSIS — R0789 Other chest pain: Secondary | ICD-10-CM | POA: Insufficient documentation

## 2022-12-20 DIAGNOSIS — Z91010 Allergy to peanuts: Secondary | ICD-10-CM | POA: Insufficient documentation

## 2022-12-20 NOTE — ED Triage Notes (Addendum)
C/o fall 3 days ago when coming out of shower and slipping. Center of chest hit the corner.  C/o centralized cp since. Started coughing up blood yesterday described as blood streaking in sputum  Sob with numbness to all extremities started today.  Intermittent blurry vision in triage.

## 2022-12-20 NOTE — Discharge Instructions (Signed)
You were seen in the ER today for chest pain. I would encouraged you to follow up with your primary care provider or return to the ER if symptoms worsen. Your symptoms appear to be due to the impact from falling on your chest. Take Tylenol, ibuprofen, or Aleve as needed for pain at home.

## 2022-12-20 NOTE — ED Provider Notes (Signed)
[place] EMERGENCY DEPARTMENT AT [facility] Provider Note   CSN: [id number] Arrival date & time: 12/20/22  1905     History Chief Complaint  Patient presents with   Chest Pain   Shortness of Breath    Pamela Mccarty is a 27 y.o. female. Patient presents to the ED with concerns of chest pain. She reports that this chest pain occurred after a fall three days ago in which she hit her sternum against a table. States that she now has some shortness of breath due to pain with deep inhalation. Also reporting some blood in sputum. No history of DVT/PE, not on OCP or other hormonal medication, no recent surgery or immobilization, or recent leg swelling.   Chest Pain Associated symptoms: shortness of breath   Shortness of Breath Associated symptoms: chest pain        Home Medications Prior to Admission medications   Medication Sig Start Date End Date Taking? Authorizing Provider  acetaminophen (TYLENOL) 500 MG tablet Take 500 mg by mouth every 4 (four) hours as needed for moderate pain or fever. Patient not taking: Reported on 03/22/2022    [provider]  medroxyPROGESTERone (PROVERA) 10 MG tablet Take 1 tablet (10 mg total) by mouth daily. 03/18/22   Triplett, Tammy, PA-C  ondansetron (ZOFRAN) 4 MG tablet Take 1 tablet (4 mg total) by mouth every 6 (six) hours. Patient not taking: Reported on 03/18/2022 10/03/21   Eber Hong, MD  traMADol Janean Sark) 50 MG tablet Take 1 every 6-8 hours for pain not relieved by Tylenol or Motrin 11/22/22   Bethann Berkshire, MD      Allergies    Peanut-containing drug products, Latex, Soy allergy, and Tomato    Review of Systems   Review of Systems  Respiratory:  Positive for shortness of breath.   Cardiovascular:  Positive for chest pain.  All other systems reviewed and are negative.   Physical Exam Updated Vital Signs BP 125/78   Pulse 95   Temp 98.7 F (37.1 C)   Resp 20   Wt 97.5 kg   LMP 11/24/2022   SpO2  100%   BMI 35.78 kg/m  Physical Exam Vitals and nursing note reviewed.  Constitutional:      General: She is not in acute distress.    Appearance: She is well-developed.  HENT:     Head: Normocephalic and atraumatic.  Eyes:     Conjunctiva/sclera: Conjunctivae normal.  Cardiovascular:     Rate and Rhythm: Normal rate and regular rhythm.     Heart sounds: No murmur heard.    Comments: TTP along the central sternum. Pulmonary:     Effort: Pulmonary effort is normal. No respiratory distress.     Breath sounds: Normal breath sounds. No decreased breath sounds, wheezing or rhonchi.  Abdominal:     Palpations: Abdomen is soft.     Tenderness: There is no abdominal tenderness.  Musculoskeletal:        General: No swelling.     Cervical back: Neck supple.     Right lower leg: No edema.     Left lower leg: No edema.  Skin:    General: Skin is warm and dry.     Capillary Refill: Capillary refill takes less than 2 seconds.  Neurological:     Mental Status: She is alert.  Psychiatric:        Mood and Affect: Mood normal.     ED Results / Procedures / Treatments  Labs (all labs ordered are listed, but only abnormal results are displayed) Labs Reviewed - No data to display  EKG None  Radiology DG Chest 2 View  Result Date: 12/20/2022 CLINICAL DATA:  Shortness of breath and hemoptysis EXAM: CHEST - 2 VIEW COMPARISON:  11/22/2022 FINDINGS: The heart size and mediastinal contours are within normal limits. Both lungs are clear. The visualized skeletal structures are unremarkable. IMPRESSION: No active cardiopulmonary disease. Electronically Signed   By: Alcide Clever M.D.   On: 12/20/2022 20:52    Procedures Procedures   Medications Ordered in ED Medications - No data to display  ED Course/ Medical Decision Making/ A&P                               Medical Decision Making Amount and/or Complexity of Data Reviewed Radiology: ordered.   This patient presents to the ED for  concern of chest pain, shortness of breath.  Differential diagnosis includes ACS, PE, pneumonia, bronchitis, URI   Imaging Studies ordered:  I ordered imaging studies including chest x-ray I independently visualized and interpreted imaging which showed no acute cardiopulmonary process I agree with the radiologist interpretation   Problem List / ED Course:  Patient presents to the emergency department concerns of chest pain shortness of breath.  She reports that she has been having this for the last 3 days after falling hitting her chest against a table.  She is currently endorsing some associated hemoptysis but after discussing symptoms with patient, does not appear that she has been experiencing hemoptysis but instead some increased phlegm production.  Patient is PERC negative.  Based on presentation, chest x-ray was ordered from triage as well EKG with both being unremarkable. Informed patient of reassuring results on x-ray and EKG.  After discussion with patient, appears that symptoms are likely musculoskeletal in nature given pain with palpation along the central sternum.  No evidence of any hemoptysis while patient has been here present in the emergency department.  Otherwise patient is hemodynamically stable. Advised patient to follow up with PCP for repeat evaluation given reassuring findings but also discussed strict return precautions. All questions answered prior to patient discharge. Patient discharged home in stable condition.  Final Clinical Impression(s) / ED Diagnoses Final diagnoses:  Chest wall pain    Rx / DC Orders ED Discharge Orders     None         Smitty Knudsen, PA-C 12/20/22 2248    Bethann Berkshire, MD 12/23/22 [phone]

## 2022-12-31 IMAGING — DX DG CHEST 2V
2 series · 2 of 2 positions shown · non-contrast
Comparison: 02/23/2020

CLINICAL DATA: Chest pain and shortness of breath for 1 day,
initial encounter

EXAM:
CHEST - 2 VIEW

[chest pa]
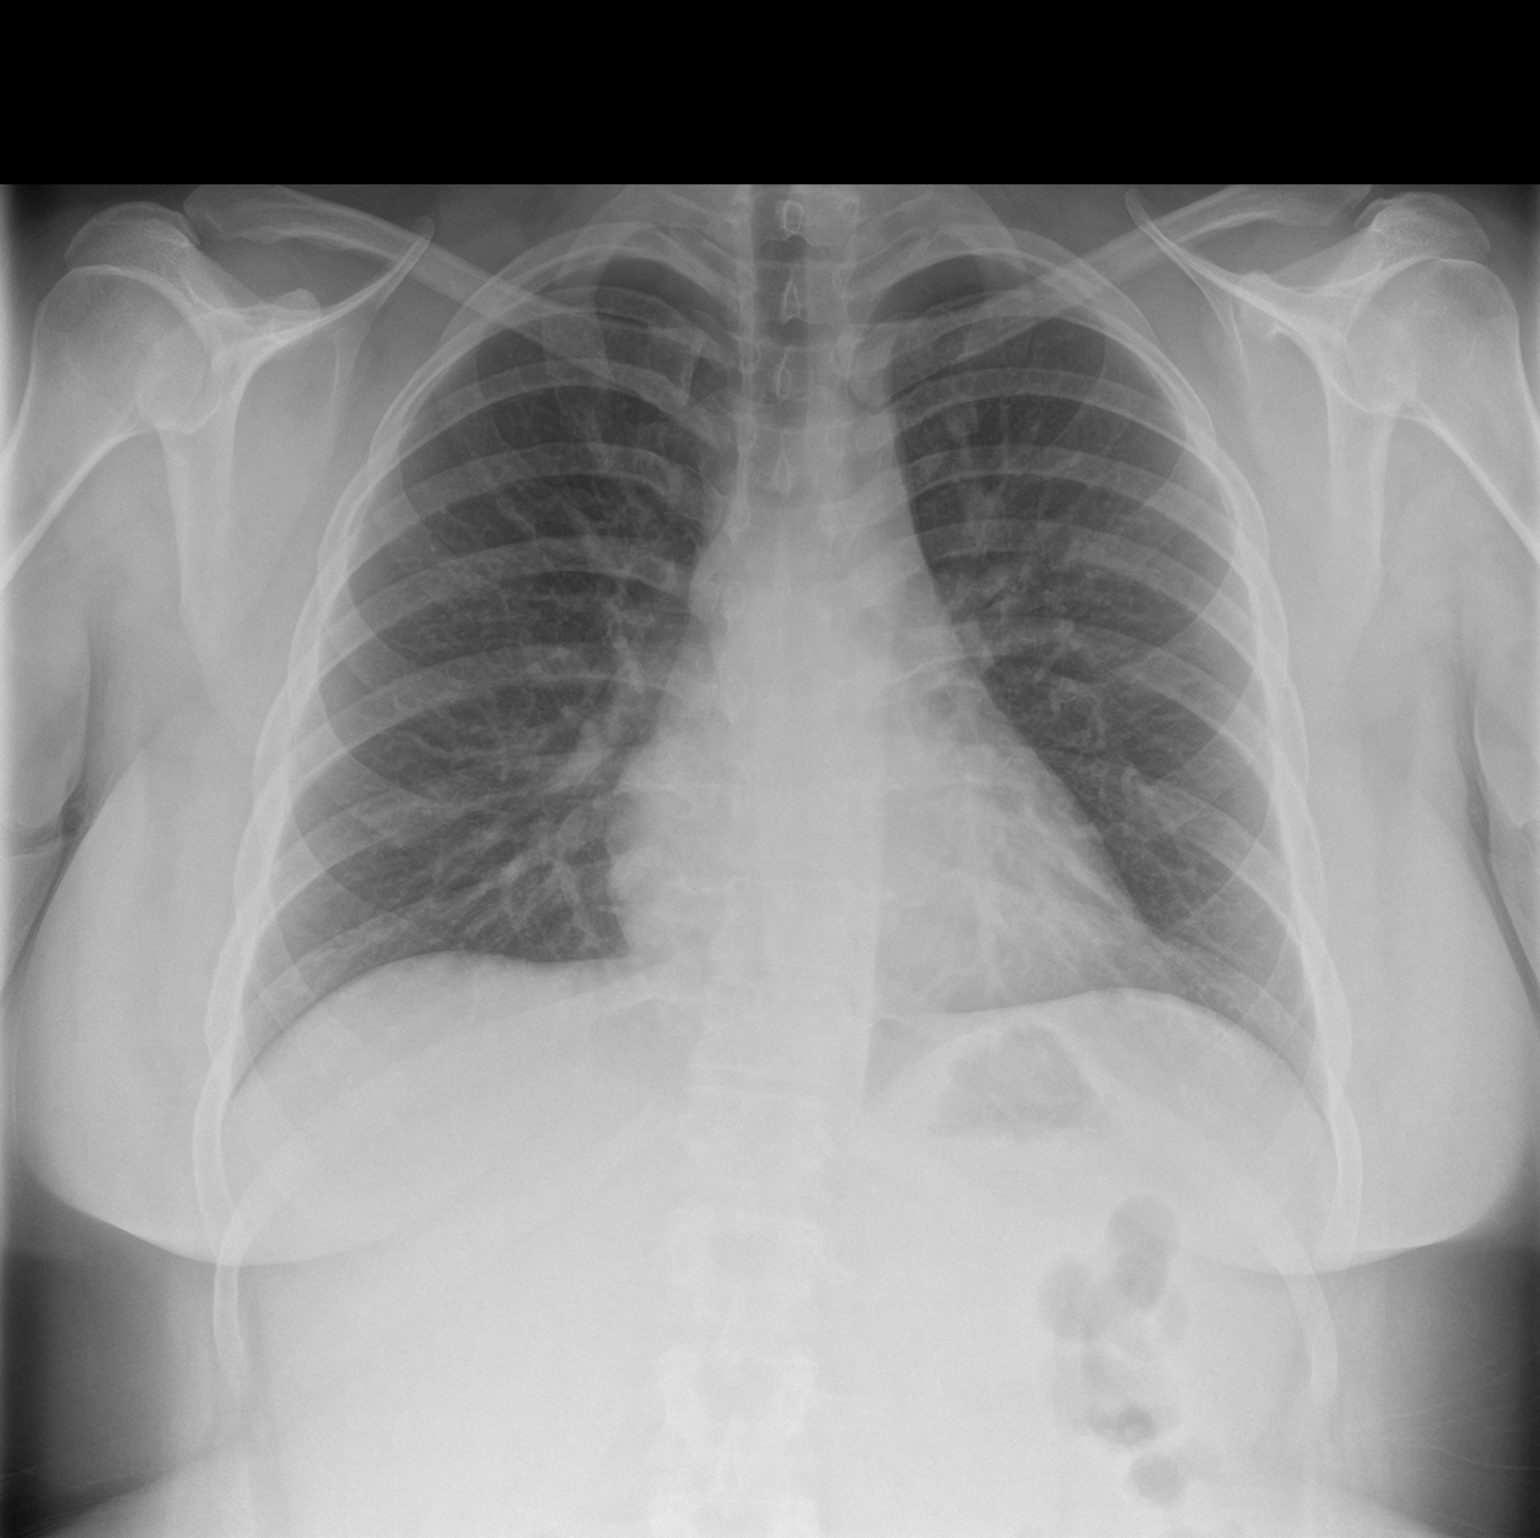

[chest lat]
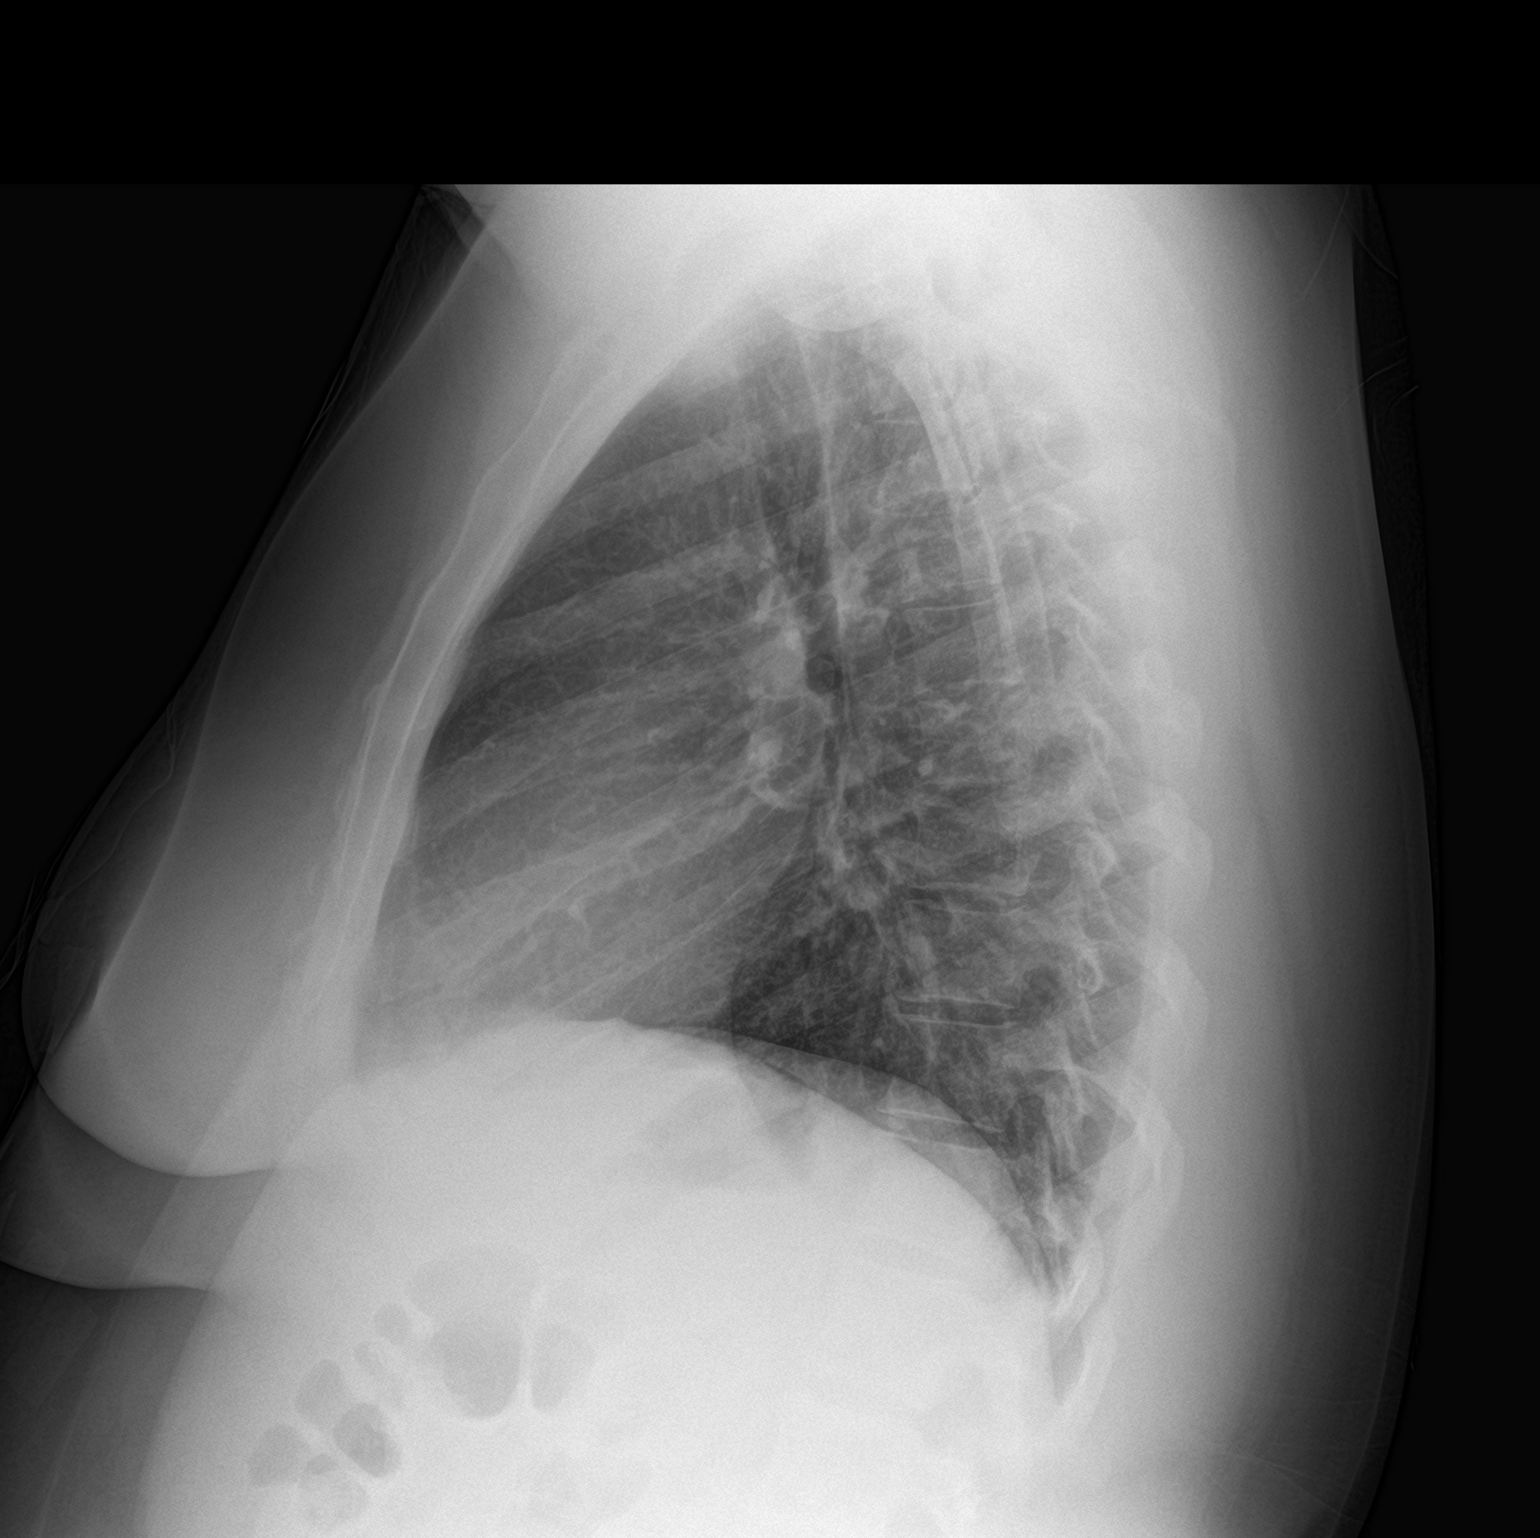

[2 of 2 positions shown; findings below may reference images not displayed]

FINDINGS: The heart size and mediastinal contours are within normal limits.
Both lungs are clear. The visualized skeletal structures are
unremarkable.
IMPRESSION: No active cardiopulmonary disease.

## 2023-01-10 ENCOUNTER — Other Ambulatory Visit: Payer: Self-pay

## 2023-01-10 ENCOUNTER — Emergency Department (HOSPITAL_COMMUNITY)
Admission: EM | Admit: 2023-01-10 | Discharge: 2023-01-10 | Disposition: A | Discharge status: Home / Self Care | Payer: Self-pay | Attending: Emergency Medicine | Admitting: Emergency Medicine

## 2023-01-10 ENCOUNTER — Encounter (HOSPITAL_COMMUNITY): Payer: Self-pay | Admitting: *Deleted

## 2023-01-10 ENCOUNTER — Emergency Department (HOSPITAL_COMMUNITY): Payer: Self-pay

## 2023-01-10 DIAGNOSIS — Z91040 Latex allergy status: Secondary | ICD-10-CM | POA: Insufficient documentation

## 2023-01-10 DIAGNOSIS — Z91010 Allergy to peanuts: Secondary | ICD-10-CM | POA: Insufficient documentation

## 2023-01-10 DIAGNOSIS — W01198A Fall on same level from slipping, tripping and stumbling with subsequent striking against other object, initial encounter: Secondary | ICD-10-CM | POA: Insufficient documentation

## 2023-01-10 DIAGNOSIS — S20212A Contusion of left front wall of thorax, initial encounter: Secondary | ICD-10-CM | POA: Insufficient documentation

## 2023-01-10 LAB — POC URINE PREG, ED: Preg Test, Ur: NEGATIVE

## 2023-01-10 MED ORDER — TRAMADOL HCL 50 MG PO TABS
50.0000 mg | ORAL_TABLET | Freq: Four times a day (QID) | ORAL | 0 refills | Status: DC | PRN
Start: 2023-01-10 — End: 2023-05-15

## 2023-01-10 NOTE — ED Triage Notes (Signed)
Pt with left rib pain, pt states she fell and hit the counter, was seen for it on 9/13.  Pt states with swelling to left ribs and coughing up blood .

## 2023-01-10 NOTE — Discharge Instructions (Signed)
Your CT today did not show evidence of a punctured lung or broken ribs.  You likely have contusions.  This should heal over time.  You may alternate ice and heat to your chest wall if needed.  Avoid strenuous coughing for several days.  You have been prescribed short course of pain medication to help with your symptoms.  Please follow-up with your primary care provider for recheck.

## 2023-01-13 NOTE — ED Provider Notes (Signed)
[place] EMERGENCY DEPARTMENT AT [facility] Provider Note   CSN: [id number] Arrival date & time: 01/10/23  1431     History  Chief Complaint  Patient presents with   Chest Pain    Pamela Mccarty is a 27 y.o. female.   Chest Pain Associated symptoms: cough   Associated symptoms: no abdominal pain, no back pain, no dizziness, no fever, no headache, no nausea, no shortness of breath, no vomiting and no weakness         Pamela Mccarty is a 27 y.o. female who presents to the Emergency Department complaining of left rib pain.  She was seen here last month for rib injury.  Had another mechanical fall few days ago where she fell hitting her left lateral chest on a counter.  Since then, she has had swelling of her lateral rib area and intermittently coughing up streaks of blood and mucus.  Head injury, LOC, abdominal pain and shortness of breath    Home Medications Prior to Admission medications   Medication Sig Start Date End Date Taking? Authorizing Provider  traMADol (ULTRAM) 50 MG tablet Take 1 tablet (50 mg total) by mouth every 6 (six) hours as needed. 01/10/23  Yes Ermina Oberman, PA-C  acetaminophen (TYLENOL) 500 MG tablet Take 500 mg by mouth every 4 (four) hours as needed for moderate pain or fever. Patient not taking: Reported on 03/22/2022    [provider]  medroxyPROGESTERone (PROVERA) 10 MG tablet Take 1 tablet (10 mg total) by mouth daily. 03/18/22   Stesha Neyens, PA-C  ondansetron (ZOFRAN) 4 MG tablet Take 1 tablet (4 mg total) by mouth every 6 (six) hours. Patient not taking: Reported on 03/18/2022 10/03/21   Eber Hong, MD      Allergies    Peanut-containing drug products, Latex, Soy allergy, and Tomato    Review of Systems   Review of Systems  Constitutional:  Negative for chills and fever.  Respiratory:  Positive for cough. Negative for shortness of breath and wheezing.   Cardiovascular:  Positive for chest pain.        Left rib pain  Gastrointestinal:  Negative for abdominal pain, nausea and vomiting.  Genitourinary:  Negative for flank pain and hematuria.  Musculoskeletal:  Negative for back pain.  Neurological:  Negative for dizziness, syncope, weakness and headaches.  Psychiatric/Behavioral:  Negative for confusion.     Physical Exam Updated Vital Signs BP (!) 145/99   Pulse 89   Temp 98.2 F (36.8 C) (Oral)   Resp 18   Ht 5\' 5"  (1.651 m)   Wt 86.2 kg   LMP 01/07/2023   SpO2 97%   BMI 31.62 kg/m  Physical Exam Vitals and nursing note reviewed.  Constitutional:      General: She is not in acute distress.    Appearance: Normal appearance. She is well-developed. She is not toxic-appearing.  Cardiovascular:     Rate and Rhythm: Normal rate and regular rhythm.     Pulses: Normal pulses.  Pulmonary:     Effort: Pulmonary effort is normal. No respiratory distress.     Breath sounds: Normal breath sounds.     Comments: Ttp of lateral left ribs, no soft tissue crepitus or bony deformity Chest:     Chest wall: Tenderness present.  Abdominal:     General: There is no distension.     Palpations: Abdomen is soft.     Tenderness: There is no abdominal tenderness.  Musculoskeletal:  General: Normal range of motion.  Skin:    General: Skin is warm.     Capillary Refill: Capillary refill takes less than 2 seconds.     Findings: No bruising or erythema.  Neurological:     General: No focal deficit present.     Mental Status: She is alert.     Sensory: No sensory deficit.     Motor: No weakness.     ED Results / Procedures / Treatments   Labs (all labs ordered are listed, but only abnormal results are displayed) Labs Reviewed  POC URINE PREG, ED    EKG None  Radiology CT Chest Wo Contrast  Result Date: 01/10/2023 CLINICAL DATA:  Left rib pain after a fall.  Hemoptysis. EXAM: CT CHEST WITHOUT CONTRAST TECHNIQUE: Multidetector CT imaging of the chest was performed following  the standard protocol without IV contrast. RADIATION DOSE REDUCTION: This exam was performed according to the departmental dose-optimization program which includes automated exposure control, adjustment of the mA and/or kV according to patient size and/or use of iterative reconstruction technique. COMPARISON:  Chest radiograph 12/20/2022.  No prior CT. FINDINGS: Cardiovascular: Normal aortic caliber.  Borderline cardiomegaly. Mediastinum/Nodes: No mediastinal or hilar adenopathy. Lungs/Pleura: No pleural fluid.  No pneumothorax.  Clear lungs. Upper Abdomen: Normal imaged portions of the liver, spleen, stomach, pancreas, gallbladder, adrenal glands, kidneys. No free intraperitoneal air. Musculoskeletal: No chest wall hematoma.  No rib fracture. IMPRESSION: No acute or posttraumatic deformity identified. Electronically Signed   By: Jeronimo Greaves M.D.   On: 01/10/2023 17:11   DG Chest 2 View  Result Date: 12/20/2022 CLINICAL DATA:  Shortness of breath and hemoptysis EXAM: CHEST - 2 VIEW COMPARISON:  11/22/2022 FINDINGS: The heart size and mediastinal contours are within normal limits. Both lungs are clear. The visualized skeletal structures are unremarkable. IMPRESSION: No active cardiopulmonary disease. Electronically Signed   By: Alcide Clever M.D.   On: 12/20/2022 20:52     Procedures Procedures    Medications Ordered in ED Medications - No data to display  ED Course/ Medical Decision Making/ A&P                                 Medical Decision Making Pt here for evaluation of injury to left chest wall.  Sustained a mechanical fall against the edge of a counter.  She has pain with breathing and movement, intermittent hemoptysis.  She also admits to forceful coughing secondary to smoking marijuana,  denies fever and dyspnea.    Pt well appearing.  No active cough.  Lungs sound clear.  No bony deformities of the chest.  I suspect rib contusion.  Fx, pneumothorax also considered.  Cardiac contusion  doubtful  Amount and/or Complexity of Data Reviewed Radiology: ordered.    Details: Ct chest w/o acute findings. Discussion of management or test interpretation with external provider(s): Pt with rib contusion.  Vitals reassuring.  No dyspnea or hypoxia.  Intermittent hemoptysis felt to be secondary to forceful coughing.  Advised of smoking cessation  Risk Prescription drug management.           Final Clinical Impression(s) / ED Diagnoses Final diagnoses:  Contusion of rib on left side, initial encounter    Rx / DC Orders ED Discharge Orders          Ordered    traMADol (ULTRAM) 50 MG tablet  Every 6 hours PRN  01/10/23 1735              Pauline Aus, PA-C 01/13/23 1222    Terrilee Files, MD 01/14/23 [phone]

## 2023-03-08 ENCOUNTER — Other Ambulatory Visit: Payer: Self-pay

## 2023-03-08 ENCOUNTER — Emergency Department (HOSPITAL_COMMUNITY)
Admission: EM | Admit: 2023-03-08 | Discharge: 2023-03-08 | Disposition: A | Discharge status: Home / Self Care | Payer: Self-pay | Attending: Emergency Medicine | Admitting: Emergency Medicine

## 2023-03-08 ENCOUNTER — Encounter (HOSPITAL_COMMUNITY): Payer: Self-pay | Admitting: Radiology

## 2023-03-08 DIAGNOSIS — J45909 Unspecified asthma, uncomplicated: Secondary | ICD-10-CM | POA: Insufficient documentation

## 2023-03-08 DIAGNOSIS — O219 Vomiting of pregnancy, unspecified: Secondary | ICD-10-CM | POA: Insufficient documentation

## 2023-03-08 DIAGNOSIS — Z91010 Allergy to peanuts: Secondary | ICD-10-CM | POA: Insufficient documentation

## 2023-03-08 DIAGNOSIS — O99511 Diseases of the respiratory system complicating pregnancy, first trimester: Secondary | ICD-10-CM | POA: Insufficient documentation

## 2023-03-08 DIAGNOSIS — Z91040 Latex allergy status: Secondary | ICD-10-CM | POA: Insufficient documentation

## 2023-03-08 DIAGNOSIS — Z3A01 Less than 8 weeks gestation of pregnancy: Secondary | ICD-10-CM | POA: Insufficient documentation

## 2023-03-08 DIAGNOSIS — R112 Nausea with vomiting, unspecified: Secondary | ICD-10-CM

## 2023-03-08 LAB — URINALYSIS, ROUTINE W REFLEX MICROSCOPIC
Bilirubin Urine: NEGATIVE
Glucose, UA: NEGATIVE mg/dL
Hgb urine dipstick: NEGATIVE
Ketones, ur: NEGATIVE mg/dL
Leukocytes,Ua: NEGATIVE
Nitrite: NEGATIVE
Protein, ur: NEGATIVE mg/dL
Specific Gravity, Urine: 1.017 (ref 1.005–1.030)
pH: 6 (ref 5.0–8.0)

## 2023-03-08 LAB — CBC WITH DIFFERENTIAL/PLATELET
Abs Immature Granulocytes: 0.03 10*3/uL (ref 0.00–0.07)
Basophils Absolute: 0.1 10*3/uL (ref 0.0–0.1)
Basophils Relative: 1 %
Eosinophils Absolute: 0.1 10*3/uL (ref 0.0–0.5)
Eosinophils Relative: 1 %
HCT: 43.4 % (ref 36.0–46.0)
Hemoglobin: 14.8 g/dL (ref 12.0–15.0)
Immature Granulocytes: 0 %
Lymphocytes Relative: 21 %
Lymphs Abs: 1.9 10*3/uL (ref 0.7–4.0)
MCH: 30.6 pg (ref 26.0–34.0)
MCHC: 34.1 g/dL (ref 30.0–36.0)
MCV: 89.7 fL (ref 80.0–100.0)
Monocytes Absolute: 0.6 10*3/uL (ref 0.1–1.0)
Monocytes Relative: 7 %
Neutro Abs: 6.4 10*3/uL (ref 1.7–7.7)
Neutrophils Relative %: 70 %
Platelets: 231 10*3/uL (ref 150–400)
RBC: 4.84 MIL/uL (ref 3.87–5.11)
RDW: 12.2 % (ref 11.5–15.5)
WBC: 9.1 10*3/uL (ref 4.0–10.5)
nRBC: 0 % (ref 0.0–0.2)

## 2023-03-08 LAB — COMPREHENSIVE METABOLIC PANEL
ALT: 12 U/L (ref 0–44)
AST: 12 U/L — ABNORMAL LOW (ref 15–41)
Albumin: 3.6 g/dL (ref 3.5–5.0)
Alkaline Phosphatase: 49 U/L (ref 38–126)
Anion gap: 7 (ref 5–15)
BUN: 11 mg/dL (ref 6–20)
CO2: 23 mmol/L (ref 22–32)
Calcium: 9 mg/dL (ref 8.9–10.3)
Chloride: 107 mmol/L (ref 98–111)
Creatinine, Ser: 0.79 mg/dL (ref 0.44–1.00)
GFR, Estimated: >60 mL/min (ref >60)
Glucose, Bld: 82 mg/dL (ref 70–99)
Potassium: 3.7 mmol/L (ref 3.5–5.1)
Sodium: 137 mmol/L (ref 135–145)
Total Bilirubin: 0.4 mg/dL (ref <1.2)
Total Protein: 6.3 g/dL — ABNORMAL LOW (ref 6.5–8.1)

## 2023-03-08 LAB — HCG, QUANTITATIVE, PREGNANCY: hCG, Beta Chain, Quant, S: 60934 m[IU]/mL — ABNORMAL HIGH (ref <5)

## 2023-03-08 LAB — PREGNANCY, URINE: Preg Test, Ur: POSITIVE — AB

## 2023-03-08 LAB — LIPASE, BLOOD: Lipase: 28 U/L (ref 11–51)

## 2023-03-08 MED ORDER — ONDANSETRON HCL 4 MG/2ML IJ SOLN
4.0000 mg | Freq: Once | INTRAMUSCULAR | Status: AC
  Administered 2023-03-08: 4 mg via INTRAVENOUS
  Filled 2023-03-08: qty 2

## 2023-03-08 MED ORDER — PRENATAL COMPLETE 14-0.4 MG PO TABS: 1.0000 | ORAL_TABLET | Freq: Every day | ORAL | 2 refills | Status: DC

## 2023-03-08 MED ORDER — DOXYLAMINE-PYRIDOXINE 10-10 MG PO TBEC: 2.0000 | DELAYED_RELEASE_TABLET | Freq: Every evening | ORAL | 0 refills | Status: DC | PRN

## 2023-03-08 NOTE — ED Provider Notes (Signed)
[place] EMERGENCY DEPARTMENT AT [facility] Provider Note   CSN: [id number] Arrival date & time: 03/08/23  1342     History  Chief Complaint  Patient presents with   Abdominal Pain    Pamela Mccarty is a 26 y.o. female with a history of anxiety, seizure disorder, adhd, asthma, presenting with a 3 week history of nausea,vomiting and diarrhea.  Denies fevers,  endorses intermittent generalized abdominal pain, described as cramping with bm's, then resolves.  States she has vomited everything for the past 3 weeks and has lost nearly 30 lbs.  No hematemesis,  bm's nonbloody,  has about 2 diarrheal stools per week,  normal between episodes.  LMP 3 weeks ago, light and only 3 days so wonders about possible pregnancy.  She has found no alleviators.  She last ate pinto beans and corn bread prior to arrival and so far has not vomited, stating it usually takes about an hour after she eats before the nausea starts.   The history is provided by the patient.       Home Medications Prior to Admission medications   Medication Sig Start Date End Date Taking? Authorizing Provider  Doxylamine-Pyridoxine (DICLEGIS) 10-10 MG TBEC Take 2 tablets by mouth at bedtime as needed (nausea). If nausea persists beyond the first 2 days, may increase to 1 tab PO qam 03/08/23  Yes Amariyon Maynes, Raynelle Fanning, PA-C  Prenatal Vit-Fe Fumarate-FA (PRENATAL COMPLETE) 14-0.4 MG TABS Take 1 tablet by mouth daily. 03/08/23  Yes Everley Evora, Raynelle Fanning, PA-C  acetaminophen (TYLENOL) 500 MG tablet Take 500 mg by mouth every 4 (four) hours as needed for moderate pain or fever. Patient not taking: Reported on 03/22/2022    [provider]  medroxyPROGESTERone (PROVERA) 10 MG tablet Take 1 tablet (10 mg total) by mouth daily. 03/18/22   Triplett, Tammy, PA-C  ondansetron (ZOFRAN) 4 MG tablet Take 1 tablet (4 mg total) by mouth every 6 (six) hours. Patient not taking: Reported on 03/18/2022 10/03/21   Eber Hong, MD  traMADol  (ULTRAM) 50 MG tablet Take 1 tablet (50 mg total) by mouth every 6 (six) hours as needed. 01/10/23   Triplett, Tammy, PA-C      Allergies    Peanut-containing drug products, Latex, Soy allergy, and Tomato    Review of Systems   Review of Systems  Constitutional:  Negative for chills and fever.  HENT:  Negative for congestion.   Eyes: Negative.   Respiratory:  Negative for chest tightness and shortness of breath.   Cardiovascular:  Negative for chest pain.  Gastrointestinal:  Positive for abdominal pain, diarrhea, nausea and vomiting.  Genitourinary: Negative.  Negative for dysuria, hematuria, menstrual problem, pelvic pain, vaginal bleeding and vaginal discharge.  Musculoskeletal:  Negative for arthralgias, joint swelling and neck pain.  Skin: Negative.  Negative for rash and wound.  Neurological:  Negative for dizziness, weakness, light-headedness, numbness and headaches.  Psychiatric/Behavioral: Negative.      Physical Exam Updated Vital Signs BP 132/70 (BP Location: Left Arm)   Pulse 86   Temp 97.7 F (36.5 C) (Oral)   Resp 18   Ht 5\' 5"  (1.651 m)   Wt 90.7 kg   SpO2 99%   BMI 33.28 kg/m  Physical Exam Vitals and nursing note reviewed.  Constitutional:      General: She is not in acute distress.    Appearance: She is well-developed. She is not toxic-appearing.  HENT:     Head: Normocephalic and atraumatic.  Eyes:  Conjunctiva/sclera: Conjunctivae normal.  Cardiovascular:     Rate and Rhythm: Normal rate and regular rhythm.     Heart sounds: Normal heart sounds.  Pulmonary:     Effort: Pulmonary effort is normal.     Breath sounds: Normal breath sounds. No wheezing.  Abdominal:     General: Abdomen is protuberant. Bowel sounds are normal.     Palpations: Abdomen is soft.     Tenderness: There is generalized abdominal tenderness. There is no guarding or rebound.     Comments: Normoactive BS,  no increased tympany to palpation.  Musculoskeletal:        General:  Normal range of motion.     Cervical back: Normal range of motion.  Skin:    General: Skin is warm and dry.  Neurological:     Mental Status: She is alert.     ED Results / Procedures / Treatments   Labs (all labs ordered are listed, but only abnormal results are displayed) Labs Reviewed  COMPREHENSIVE METABOLIC PANEL - Abnormal; Notable for the following components:      Result Value   Total Protein 6.3 (*)    AST 12 (*)    All other components within normal limits  PREGNANCY, URINE - Abnormal; Notable for the following components:   Preg Test, Ur POSITIVE (*)    All other components within normal limits  HCG, QUANTITATIVE, PREGNANCY - Abnormal; Notable for the following components:   hCG, Beta Chain, Quant, S [id number] (*)    All other components within normal limits  LIPASE, BLOOD  URINALYSIS, ROUTINE W REFLEX MICROSCOPIC  CBC WITH DIFFERENTIAL/PLATELET    EKG None  Radiology No results found.  Procedures Procedures    Medications Ordered in ED Medications  ondansetron (ZOFRAN) injection 4 mg (4 mg Intravenous Given 03/08/23 1437)    ED Course/ Medical Decision Making/ A&P                                 Medical Decision Making Patient presenting with a 3-week history of nausea and vomiting, cramping abdominal pain with episodes of diarrhea, only reporting pain and diarrhea about twice per week.  She states she is lost 30 pounds since her symptoms began, however review of her chart indicates a 10 pound gain between today and her last ED visit in October.  Despite patient's reported history of her labs are all reassuring, there is no evidence for dehydration, she is pregnant, her hCG quant and her LMP suggest she is approximately 5 weeks.  We discussed this finding, she will [facility] or [facility] in [place].  She is prescribed likely just an prenatal vitamin today.  She was stable at time of discharge.  Amount and/or Complexity of Data  Reviewed Labs: ordered.    Details: Labs are essentially normal with the exception of positive pregnancy test.  Her CBC is normal with a WBC count of 9.1 she has a normal hemoglobin at 14.8, her c-Met and lipase are normal, creatinine 0.79, LFTs and lipase are normal range.  Urinalysis is negative.  Her hCG Sharene Butters is [id number].  Risk OTC drugs. Prescription drug management.           Final Clinical Impression(s) / ED Diagnoses Final diagnoses:  Less than [redacted] weeks gestation of pregnancy  Nausea and vomiting, unspecified vomiting type    Rx / DC Orders ED Discharge Orders  Ordered    Doxylamine-Pyridoxine (DICLEGIS) 10-10 MG TBEC  At bedtime PRN        03/08/23 1607    Prenatal Vit-Fe Fumarate-FA (PRENATAL COMPLETE) 14-0.4 MG TABS  Daily        03/08/23 1607              Burgess Amor, Cordelia Poche 03/08/23 Dortha Kern, MD 03/09/23 [phone]

## 2023-03-08 NOTE — ED Triage Notes (Signed)
Pt states she has been throwing up everyday for the past 3 weeks. Pt has not been seen for this. Pt unsure if she is pregnant. Pt states she also has lightheadedness and chest discomfort only when she is vomiting.

## 2023-04-09 NOTE — L&D Delivery Note (Signed)
    Pamela Mccarty is a 28 y.o. [phone] s/p VD at [redacted]w[redacted]d. She was admitted for IOL s/t gHTN.   ROM: 0h 52m with clear fluid GBS Status: Positive; Adequate Tx Maximum Maternal Temperature: 98.2  Labor Progress: Patient admitted and started on dual cytotec.  After initial dose patient with minimal change, but with frequent contractions and pitocin  started. Patient with SROM and progressed to complete. Infant with FHTs down into 90s, but good variability.  NICU team called to bedside prior to delivery.   Delivery Date/Time: Thursday July 3rd, 2025 at 0212 Delivery: Called to room and patient was complete. Head delivered in LOA with compound right hand.  After delivery of head, nuchal cord noted that shoulders and body was delivered through via somersault maneuver. Infant without grimace and flaccid tone.  Tactile stimulation given, by provider, with no improvement and cord clamped x 2 after ~30 second delay. Venous cord gas drawn and return at 7.06. Cord blood also drawn. Placenta delivered, via Duncan maneuver, spontaneously with gentle cord traction and was noted to be intact with 3VC upon inspection.  Vaginal inspection revealed a left labial laceration, but patient refused repair despite provider recommendation.  Gauze packing applied to the area for hemostasis.  Fundus firm, at the umbilicus, and bleeding small.  Reviewed immediate postpartum care including fundal checks, pain medication, and transfer to [facility]. Mother hemodynamically stable and infant warmer prior to provider exit.  Mother desires Nexplanon for birth control and opts to breastfeed.      Placenta: Intact, Disposal Complications: GHTN, Anxiety Lacerations: Left Labial-Unrepaired EBL: 400 Analgesia: None  Postpartum Planning -Feeding: Breast -Circumcision: N/A -Anticipated BCM: Nexplanon -PP Message Sent -Discharge Summary Shared  Infant: Female-Pamela Mccarty  APGARs 2, 7, 8  2750g-6lbs 1oz, 20 in  Pamela Mccarty Pamela Mccarty,  [place]  10/09/2023 3:48 AM  Delivery Report: Review the Delivery Report for details.

## 2023-04-15 DIAGNOSIS — O2341 Unspecified infection of urinary tract in pregnancy, first trimester: Secondary | ICD-10-CM | POA: Diagnosis not present

## 2023-04-15 DIAGNOSIS — O99511 Diseases of the respiratory system complicating pregnancy, first trimester: Secondary | ICD-10-CM | POA: Diagnosis not present

## 2023-04-28 ENCOUNTER — Other Ambulatory Visit: Payer: Self-pay | Admitting: Obstetrics & Gynecology

## 2023-04-28 DIAGNOSIS — O0930 Supervision of pregnancy with insufficient antenatal care, unspecified trimester: Secondary | ICD-10-CM

## 2023-04-28 DIAGNOSIS — O3680X0 Pregnancy with inconclusive fetal viability, not applicable or unspecified: Secondary | ICD-10-CM

## 2023-04-29 ENCOUNTER — Encounter: Payer: Self-pay | Admitting: Radiology

## 2023-04-29 ENCOUNTER — Other Ambulatory Visit: Payer: Self-pay | Admitting: Obstetrics & Gynecology

## 2023-04-29 ENCOUNTER — Other Ambulatory Visit: Payer: Medicaid Other

## 2023-04-29 ENCOUNTER — Ambulatory Visit (INDEPENDENT_AMBULATORY_CARE_PROVIDER_SITE_OTHER): Payer: Medicaid Other | Admitting: Radiology

## 2023-04-29 DIAGNOSIS — Z3A13 13 weeks gestation of pregnancy: Secondary | ICD-10-CM

## 2023-04-29 DIAGNOSIS — Z3A16 16 weeks gestation of pregnancy: Secondary | ICD-10-CM

## 2023-04-29 DIAGNOSIS — O0932 Supervision of pregnancy with insufficient antenatal care, second trimester: Secondary | ICD-10-CM | POA: Diagnosis not present

## 2023-04-29 DIAGNOSIS — O0931 Supervision of pregnancy with insufficient antenatal care, first trimester: Secondary | ICD-10-CM

## 2023-04-29 DIAGNOSIS — O3680X0 Pregnancy with inconclusive fetal viability, not applicable or unspecified: Secondary | ICD-10-CM

## 2023-04-29 DIAGNOSIS — O0930 Supervision of pregnancy with insufficient antenatal care, unspecified trimester: Secondary | ICD-10-CM

## 2023-04-29 DIAGNOSIS — Z3490 Encounter for supervision of normal pregnancy, unspecified, unspecified trimester: Secondary | ICD-10-CM | POA: Insufficient documentation

## 2023-04-29 DIAGNOSIS — Z3480 Encounter for supervision of other normal pregnancy, unspecified trimester: Secondary | ICD-10-CM | POA: Diagnosis not present

## 2023-04-29 NOTE — Progress Notes (Signed)
[place]:  GA by LMP = [redacted]w[redacted]d  CRL = 77.99 mm correlates to GA of [redacted]w[redacted]d with EDD = 10-29-23 FHR = 160 bpm  anterior placenta, normal amn fluid CL = 4.7 cm,  closed Normal ov's  Size < Dates   EDD by ultrasound today = 10-29-23 NT screen will be added on and will be reported by [facility]

## 2023-04-30 DIAGNOSIS — Z131 Encounter for screening for diabetes mellitus: Secondary | ICD-10-CM | POA: Diagnosis not present

## 2023-04-30 DIAGNOSIS — Z3A13 13 weeks gestation of pregnancy: Secondary | ICD-10-CM | POA: Diagnosis not present

## 2023-04-30 DIAGNOSIS — Z3480 Encounter for supervision of other normal pregnancy, unspecified trimester: Secondary | ICD-10-CM | POA: Diagnosis not present

## 2023-05-01 ENCOUNTER — Encounter: Payer: Self-pay | Admitting: Women's Health

## 2023-05-02 LAB — CBC/D/PLT+RPR+RH+ABO+RUBIGG...
Antibody Screen: NEGATIVE
Basophils Absolute: 0 x10E3/uL (ref 0.0–0.2)
Basos: 0 %
EOS (ABSOLUTE): 0.1 x10E3/uL (ref 0.0–0.4)
Eos: 1 %
HCV Ab: NONREACTIVE
HIV Screen 4th Generation wRfx: NONREACTIVE
Hematocrit: 42.1 % (ref 34.0–46.6)
Hemoglobin: 14.6 g/dL (ref 11.1–15.9)
Hepatitis B Surface Ag: NEGATIVE
Immature Grans (Abs): 0 x10E3/uL (ref 0.0–0.1)
Immature Granulocytes: 0 %
Lymphocytes Absolute: 1.7 x10E3/uL (ref 0.7–3.1)
Lymphs: 22 %
MCH: 31.1 pg (ref 26.6–33.0)
MCHC: 34.7 g/dL (ref 31.5–35.7)
MCV: 90 fL (ref 79–97)
Monocytes Absolute: 0.4 x10E3/uL (ref 0.1–0.9)
Monocytes: 5 %
Neutrophils Absolute: 5.8 x10E3/uL (ref 1.4–7.0)
Neutrophils: 72 %
Platelets: 227 x10E3/uL (ref 150–450)
RBC: 4.69 x10E6/uL (ref 3.77–5.28)
RDW: 12.3 % (ref 11.7–15.4)
RPR Ser Ql: NONREACTIVE
Rh Factor: POSITIVE
Rubella Antibodies, IGG: 3.41 {index}
WBC: 8 x10E3/uL (ref 3.4–10.8)

## 2023-05-02 LAB — HEMOGLOBIN A1C
Est. average glucose Bld gHb Est-mCnc: 91 mg/dL
Hgb A1c MFr Bld: 4.8 % (ref 4.8–5.6)

## 2023-05-02 LAB — INTEGRATED 1
Crown Rump Length: 78 mm
Gest. Age on Collection Date: 13.7 wk
Maternal Age at EDD: 26 a
Nuchal Translucency (NT): 1.9 mm
Number of Fetuses: 1
PAPP-A Value: 1569.2 ng/mL
Sonographer ID#: 309760
Weight: 200 [lb_av]

## 2023-05-02 LAB — HCV INTERPRETATION

## 2023-05-06 ENCOUNTER — Encounter: Payer: Self-pay | Admitting: Women's Health

## 2023-05-06 LAB — PANORAMA PRENATAL TEST FULL PANEL:PANORAMA TEST PLUS 5 ADDITIONAL MICRODELETIONS: FETAL FRACTION: 9.4

## 2023-05-08 ENCOUNTER — Ambulatory Visit (INDEPENDENT_AMBULATORY_CARE_PROVIDER_SITE_OTHER): Payer: Medicaid Other | Admitting: Advanced Practice Midwife

## 2023-05-08 ENCOUNTER — Encounter: Payer: Self-pay | Admitting: Advanced Practice Midwife

## 2023-05-08 VITALS — BP 125/80 | HR 82 | Wt 200.0 lb

## 2023-05-08 DIAGNOSIS — Z3482 Encounter for supervision of other normal pregnancy, second trimester: Secondary | ICD-10-CM | POA: Diagnosis not present

## 2023-05-08 DIAGNOSIS — R319 Hematuria, unspecified: Secondary | ICD-10-CM | POA: Diagnosis not present

## 2023-05-08 DIAGNOSIS — Z3480 Encounter for supervision of other normal pregnancy, unspecified trimester: Secondary | ICD-10-CM

## 2023-05-08 DIAGNOSIS — Z3A15 15 weeks gestation of pregnancy: Secondary | ICD-10-CM

## 2023-05-08 MED ORDER — ONDANSETRON HCL 4 MG PO TABS: 4.0000 mg | ORAL_TABLET | Freq: Four times a day (QID) | ORAL | 2 refills | Status: DC

## 2023-05-08 MED ORDER — DOXYLAMINE-PYRIDOXINE 10-10 MG PO TBEC: DELAYED_RELEASE_TABLET | ORAL | 6 refills | Status: DC

## 2023-05-08 NOTE — Progress Notes (Signed)
WORK IN FOR pelvic pain and pressure.    Has not had New OB appt,    Patient name: Pamela Mccarty MRN [id number]  Date of birth: 1995-04-11 Chief Complaint:   Routine Prenatal Visit (Work-in pain.pain start middle stomach, radiate down lower area. Little spotting this morning)  History of Present Illness:   Pamela Mccarty is a 28 y.o. [phone] female at [redacted]w[redacted]d with an Estimated Date of Delivery: 10/29/23 being seen today for ongoing management of a low-risk pregnancy.  Today she reports having "blood soaking my underware" last night.  Had pressue and pain.  No true dysuria, but feels more pressure when voiding.  States brother found her "soaking wet" in the middle of the night, "changed me and got me dry".  Spotted blood once today, but pain still persists.    Review of Systems:   Pertinent items are noted in HPI Denies abnormal vaginal discharge w/ itching/odor/irritation, headaches, visual changes, shortness of breath, chest pain, abdominal pain, severe nausea/vomiting, or problems with urination or bowel movements unless otherwise stated above. Pertinent History Reviewed:  Reviewed past medical,surgical, social, obstetrical and family history.  Reviewed problem list, medications and allergies. Physical Assessment:   Vitals:   05/08/23 1100  BP: 125/80  Pulse: 82  Weight: 200 lb (90.7 kg)  Body mass index is 33.28 kg/m.        Physical Examination:   General appearance: Well appearing, and in no distress  Mental status: Alert, oriented to person, place, and time  Skin: Warm & dry  Cardiovascular: Normal heart rate noted  Respiratory: Normal respiratory effort, no distress  Abdomen: Soft, gravid, nontender  Pelvic: Cervical exam performed  Dilation: Closed Effacement (%): Thick  Firm  Extremities: Edema: Trace Chaperone:  Peggy Dones   Fetal Status: Fetal Heart Rate (bpm): 160   Movement: Absent   Informal BS US shows active fetus   No results found for this or any  previous visit (from the past 24 hours).  Assessment & Plan:    Pregnancy: [id number] at [redacted]w[redacted]d 1. Supervision of other normal pregnancy, antepartum (Primary)   2. [redacted] weeks gestation of pregnancy   3. Hematuria, unspecified type  - Urine Culture     Meds:  Meds ordered this encounter  Medications   ondansetron (ZOFRAN) 4 MG tablet    Sig: Take 1 tablet (4 mg total) by mouth every 6 (six) hours.    Dispense:  30 tablet    Refill:  2    Supervising Provider:   Duane Lope H [[id number]]   Doxylamine-Pyridoxine (DICLEGIS) 10-10 MG TBEC    Sig: Take 2 qhs; may also take one in am and one in afternoon prn nausea    Dispense:  120 tablet    Refill:  6    Supervising Provider:   Lazaro Arms [[id number]]   Labs/procedures today:  Orders Placed This Encounter  Procedures   Urine Culture     Plan:  Orders Placed This Encounter  Procedures   Urine Culture   No evidence of SAB, ROM or bleeding now. Suspect "soaking wet" was urine  SAB precautions given  Jacklyn Shell DNP, CNM 05/08/2023 4:01 PM

## 2023-05-10 LAB — URINE CULTURE

## 2023-05-12 ENCOUNTER — Encounter: Payer: Self-pay | Admitting: Women's Health

## 2023-05-14 ENCOUNTER — Encounter: Payer: Self-pay | Admitting: Women's Health

## 2023-05-14 DIAGNOSIS — Z8751 Personal history of pre-term labor: Secondary | ICD-10-CM | POA: Insufficient documentation

## 2023-05-15 ENCOUNTER — Ambulatory Visit (INDEPENDENT_AMBULATORY_CARE_PROVIDER_SITE_OTHER): Payer: Medicaid Other | Admitting: Women's Health

## 2023-05-15 ENCOUNTER — Encounter: Payer: Self-pay | Admitting: Women's Health

## 2023-05-15 ENCOUNTER — Encounter: Payer: Medicaid Other | Admitting: *Deleted

## 2023-05-15 VITALS — BP 129/70 | HR 93 | Wt 203.0 lb

## 2023-05-15 DIAGNOSIS — Z3A16 16 weeks gestation of pregnancy: Secondary | ICD-10-CM | POA: Diagnosis not present

## 2023-05-15 DIAGNOSIS — G40909 Epilepsy, unspecified, not intractable, without status epilepticus: Secondary | ICD-10-CM

## 2023-05-15 DIAGNOSIS — F418 Other specified anxiety disorders: Secondary | ICD-10-CM

## 2023-05-15 DIAGNOSIS — Z8751 Personal history of pre-term labor: Secondary | ICD-10-CM

## 2023-05-15 DIAGNOSIS — Z1332 Encounter for screening for maternal depression: Secondary | ICD-10-CM | POA: Diagnosis not present

## 2023-05-15 DIAGNOSIS — Z3482 Encounter for supervision of other normal pregnancy, second trimester: Secondary | ICD-10-CM | POA: Diagnosis not present

## 2023-05-15 DIAGNOSIS — Z363 Encounter for antenatal screening for malformations: Secondary | ICD-10-CM

## 2023-05-15 DIAGNOSIS — Z3480 Encounter for supervision of other normal pregnancy, unspecified trimester: Secondary | ICD-10-CM | POA: Diagnosis not present

## 2023-05-15 MED ORDER — BLOOD PRESSURE MONITOR MISC: 0 refills | Status: DC

## 2023-05-15 MED ORDER — SERTRALINE HCL 25 MG PO TABS: 25.0000 mg | ORAL_TABLET | Freq: Every day | ORAL | 6 refills | Status: DC

## 2023-05-15 MED ORDER — PRENATAL PLUS 27-1 MG PO TABS: 1.0000 | ORAL_TABLET | Freq: Every day | ORAL | 3 refills | Status: DC

## 2023-05-15 NOTE — Progress Notes (Signed)
 INITIAL OBSTETRICAL VISIT Patient name: Pamela Mccarty MRN [id number]  Date of birth: [birth year] Chief Complaint:   Initial Prenatal Visit (Headaches, decreased appetite)  History of Present Illness:   Pamela Mccarty is a 28 y.o. [phone] Caucasian female at [redacted]w[redacted]d by US  at 13 weeks with an Estimated Date of Delivery: 10/29/23 being seen today for her initial obstetrical visit.   Patient's last menstrual period was 01/07/2023. Her obstetrical history is significant for  SAB x 2, 31.3wk PPROM/PTB .   Today she reports  headaches, decreased appetite .  Dep/anx- on zoloft  in past worked well, wants to restart. Sometimes sits in bathtub and cries for hours. Denies SI. Is ok w/ [facility] referral.  H/O seizures- no meds, no seizure in >50yrs Last pap 08/29/20. Results were: NILM w/ HRHPV not done     05/15/2023   10:59 AM 12/26/2020    8:48 AM 09/18/2020    9:24 AM 08/29/2020    1:54 PM  Depression screen PHQ 2/9  Decreased Interest 0 0 0 0  Down, Depressed, Hopeless 0 0 0 0  PHQ - 2 Score 0 0 0 0  Altered sleeping 2 2 0 1  Tired, decreased energy 2 2 0 1  Change in appetite 2 2 0 0  Feeling bad or failure about yourself  0 0 0 0  Trouble concentrating 0 0 0 0  Moving slowly or fidgety/restless 0 0 0 0  Suicidal thoughts 0 0 0 0  PHQ-9 Score 6 6 0 2        05/15/2023   10:59 AM 12/26/2020    8:49 AM 09/18/2020    9:25 AM 08/29/2020    1:54 PM  GAD 7 : Generalized Anxiety Score  Nervous, Anxious, on Edge 0 0 0 0  Control/stop worrying 0 0 0 0  Worry too much - different things 0 0 0 0  Trouble relaxing 0 0 1 0  Restless 0 0 0 0  Easily annoyed or irritable 1 0 2 0  Afraid - awful might happen 0 0 0 0  Total GAD 7 Score 1 0 3 0     Review of Systems:   Pertinent items are noted in HPI Denies cramping/contractions, leakage of fluid, vaginal bleeding, abnormal vaginal discharge w/ itching/odor/irritation, headaches, visual changes, shortness of breath, chest pain, abdominal  pain, severe nausea/vomiting, or problems with urination or bowel movements unless otherwise stated above.  Pertinent History Reviewed:  Reviewed past medical,surgical, social, obstetrical and family history.  Reviewed problem list, medications and allergies. OB History  Gravida Para Term Preterm AB Living  4 1  1 2 1   SAB IAB Ectopic Multiple Live Births  2   0 1    # Outcome Date GA Lbr Len/2nd Weight Sex Type Anes PTL Lv  4 Current           3 Preterm 01/30/21 [redacted]w[redacted]d 28:45 / 00:06 3 lb 7.4 oz (1.57 kg) F Vag-Spont EPI  LIV     Complications: Preterm premature rupture of membranes  2 SAB           1 SAB            Physical Assessment:   Vitals:   05/15/23 1040  BP: 129/70  Pulse: 93  Weight: 203 lb (92.1 kg)  Body mass index is 33.78 kg/m.       Physical Examination:  General appearance - well appearing, and in no distress  Mental status -  alert, oriented to person, place, and time  Psych:  She has a normal mood and affect  Skin - warm and dry, normal color, no suspicious lesions noted  Chest - effort normal, all lung fields clear to auscultation bilaterally  Heart - normal rate and regular rhythm  Abdomen - soft, nontender  Extremities:  No swelling or varicosities noted  Thin prep pap is not done   Chaperone: N/A    TODAY'S FHR via doppler 158  No results found for this or any previous visit (from the past 24 hours).  Assessment & Plan:  1) Low-Risk Pregnancy [id number] at [redacted]w[redacted]d with an Estimated Date of Delivery: 10/29/23   2) Initial OB visit  3) Dep/anx> wants to restart zoloft , rx sent, IBH referral sent  4) H/O seizures> no meds, none in >48yrs  5) H/O 31wk PTB d/t PPROM/PTL> declines prometrium   6) Headaches> gave printed prevention/relief measures   Meds:  Meds ordered this encounter  Medications   Blood Pressure Monitor MISC    Sig: For regular home bp monitoring during pregnancy    Dispense:  1 each    Refill:  0    Z34.81 Please mail to patient    sertraline  (ZOLOFT ) 25 MG tablet    Sig: Take 1 tablet (25 mg total) by mouth daily.    Dispense:  30 tablet    Refill:  6   prenatal vitamin w/FE, FA (PRENATAL 1 + 1) 27-1 MG TABS tablet    Sig: Take 1 tablet by mouth daily at 12 noon.    Dispense:  90 tablet    Refill:  3    Initial labs obtained Continue prenatal vitamins Reviewed n/v relief measures and warning s/s to report Reviewed recommended weight gain based on pre-gravid BMI Encouraged well-balanced diet Genetic & carrier screening discussed: requests Panorama and NT/IT, Horizon neg prev preg Ultrasound discussed; fetal survey: requested CCNC completed> form faxed if has or is planning to apply for medicaid The nature of [facility] [employer] with multiple MDs and other Advanced Practice Providers was explained to patient; also emphasized that fellows, residents, and students are part of our team. Does not have home bp cuff. Office bp cuff given: no. Rx sent: yes. Check bp weekly, let us  know if consistently >140/90.   Follow-up: Return in about 4 weeks (around 06/12/2023) for LROB, US :Anatomy, CNM, in person.   Orders Placed This Encounter  Procedures   Urine Culture   GC/Chlamydia Probe Amp   US  OB Comp + 14 Wk   INTEGRATED 2   Amb ref to [facility]    KARIYAH BAUGH CNM, [facility] 05/15/2023 11:38 AM

## 2023-05-15 NOTE — Patient Instructions (Addendum)
 Pamela Mccarty, thank you for choosing our office today! We appreciate the opportunity to meet your healthcare needs. You may receive a short survey by mail, e-mail, or through [employer]. If you are happy with your care we would appreciate if you could take just a few minutes to complete the survey questions. We read all of your comments and take your feedback very seriously. Thank you again for choosing our office.  Center for [employer] Team at [facility] ([address] [place], [place] [address]) Entrance C, located off of [address] Free 24/7 valet parking  Go to [email or web address] to register for FREE online childbirth classes  Call the office [phone]) or go to [facility] if: You begin to severe cramping Your water breaks.  Sometimes it is a big gush of fluid, sometimes it is just a trickle that keeps getting your panties wet or running down your legs You have vaginal bleeding.  It is normal to have a small amount of spotting if your cervix was checked.   [facility] Pediatricians/Family Doctors [facility]): [address] Dr. Luba BROCKS, [phone]           [facility]: [address], [phone]                [facility]): [address], [phone] (call to ask if accepting patients) [facility] Department: [address], [place]in Torres, [phone]    [facility] Pediatricians/Family Doctors [facility]): [phone] [address], [phone] [facility]: [address] [place], [phone] [facility]: [address], [phone]  [facility] Doctors  [facility]): [phone] [facility]: [address] [phone]   [facility]: [address] [phone]  [facility] Doctors  [facility]: [phone], [phone]  Home Blood Pressure Monitoring for Patients   Your provider has recommended that you check your blood pressure (BP) at least once a week at home. If you do not have a blood pressure cuff at home, one will be provided for you. Contact your provider if you have not received your monitor within 1 week.   Helpful Tips for Accurate Home Blood Pressure Checks  Don't smoke, exercise, or drink caffeine  30 minutes before checking your BP Use the restroom before checking your BP (a full bladder can raise your pressure) Relax in a comfortable upright chair Feet on the ground Left arm resting comfortably on a flat surface at the level of your heart Legs uncrossed Back supported Sit quietly and don't talk Place the cuff on your bare arm Adjust snuggly, so that only two fingertips can fit between your skin and the top of the cuff Check 2 readings separated by at least one minute Keep a log of your BP readings For a visual, please reference this diagram: http://ccnc.care/bpdiagram  Provider Name: [facility]     Phone: [phone]  Zone 1: ALL CLEAR  Continue to monitor your symptoms:  BP reading is less than 140 (top number) or less than 90 (bottom number)  No right upper stomach pain No headaches or seeing spots No feeling nauseated or throwing up No swelling in face and hands  Zone 2: CAUTION Call your doctor's office for any of the following:  BP reading is greater than 140 (top number) or greater than  90 (bottom number)  Stomach pain under your ribs in the middle or right side Headaches or seeing spots Feeling nauseated or throwing up Swelling in face and hands  Zone 3: EMERGENCY  Seek immediate medical care if you have any of the following:  BP reading is greater than160 (top number) or greater than 110 (bottom number) Severe headaches not improving with Tylenol  Serious difficulty catching your breath Any worsening symptoms from  Zone 2     Second Trimester of Pregnancy The second trimester is from week 14 through week 27 (months 4 through 6). The second trimester is often a time when you feel your best. Your body has adjusted to being pregnant, and you begin to feel better physically. Usually, morning sickness has lessened or quit completely, you may have more energy, and you may have an increase in appetite. The second trimester is also a time when the fetus is growing rapidly. At the end of the sixth month, the fetus is about 9 inches long and weighs about 1 pounds. You will likely begin to feel the baby move (quickening) between 16 and 20 weeks of pregnancy. Body changes during your second trimester Your body continues to go through many changes during your second trimester. The changes vary from woman to woman. Your weight will continue to increase. You will notice your lower abdomen bulging out. You may begin to get stretch marks on your hips, abdomen, and breasts. You may develop headaches that can be relieved by medicines. The medicines should be approved by your health care provider. You may urinate more often because the fetus is pressing on your bladder. You may develop or continue to have heartburn as a result of your pregnancy. You may develop constipation because certain hormones are causing the muscles that push waste through your intestines to slow down. You may develop hemorrhoids or swollen, bulging veins (varicose veins). You may have back pain. This is caused by: Weight gain. Pregnancy hormones that are relaxing the joints in your pelvis. A shift in weight and the muscles that support your balance. Your breasts will continue to grow and they will continue to become tender. Your gums may bleed and may be sensitive to brushing and flossing. Dark spots or blotches (chloasma, mask of pregnancy) may develop on your face. This will likely fade after the baby is born. A dark line from your belly button to  the pubic area (linea nigra) may appear. This will likely fade after the baby is born. You may have changes in your hair. These can include thickening of your hair, rapid growth, and changes in texture. Some women also have hair loss during or after pregnancy, or hair that feels dry or thin. Your hair will most likely return to normal after your baby is born.  What to expect at prenatal visits During a routine prenatal visit: You will be weighed to make sure you and the fetus are growing normally. Your blood pressure will be taken. Your abdomen will be measured to track your baby's growth. The fetal heartbeat will be listened to. Any test results from the previous visit will be discussed.  Your health care provider may ask you: How you are feeling. If you are feeling the baby move. If you have had any abnormal symptoms, such as leaking fluid, bleeding, severe headaches, or abdominal cramping. If you are using any tobacco products, including cigarettes, chewing tobacco, and electronic cigarettes. If you have any questions.  Other tests that may be performed during  your second trimester include: Blood tests that check for: Low iron levels (anemia). High blood sugar that affects pregnant women (gestational diabetes) between 45 and 28 weeks. Rh antibodies. This is to check for a protein on red blood cells (Rh factor). Urine tests to check for infections, diabetes, or protein in the urine. An ultrasound to confirm the proper growth and development of the baby. An amniocentesis to check for possible genetic problems. Fetal screens for spina bifida and Down syndrome. HIV (human immunodeficiency virus) testing. Routine prenatal testing includes screening for HIV, unless you choose not to have this test.  Follow these instructions at home: Medicines Follow your health care provider's instructions regarding medicine use. Specific medicines may be either safe or unsafe to take during  pregnancy. Take a prenatal vitamin that contains at least 600 micrograms (mcg) of folic acid . If you develop constipation, try taking a stool softener if your health care provider approves. Eating and drinking Eat a balanced diet that includes fresh fruits and vegetables, whole grains, good sources of protein such as meat, eggs, or tofu, and low-fat dairy. Your health care provider will help you determine the amount of weight gain that is right for you. Avoid raw meat and uncooked cheese. These carry germs that can cause birth defects in the baby. If you have low calcium  intake from food, talk to your health care provider about whether you should take a daily calcium  supplement. Limit foods that are high in fat and processed sugars, such as fried and sweet foods. To prevent constipation: Drink enough fluid to keep your urine clear or pale yellow. Eat foods that are high in fiber, such as fresh fruits and vegetables, whole grains, and beans. Activity Exercise only as directed by your health care provider. Most women can continue their usual exercise routine during pregnancy. Try to exercise for 30 minutes at least 5 days a week. Stop exercising if you experience uterine contractions. Avoid heavy lifting, wear low heel shoes, and practice good posture. A sexual relationship may be continued unless your health care provider directs you otherwise. Relieving pain and discomfort Wear a good support bra to prevent discomfort from breast tenderness. Take warm sitz baths to soothe any pain or discomfort caused by hemorrhoids. Use hemorrhoid cream if your health care provider approves. Rest with your legs elevated if you have leg cramps or low back pain. If you develop varicose veins, wear support hose. Elevate your feet for 15 minutes, 3-4 times a day. Limit salt in your diet. Prenatal Care Write down your questions. Take them to your prenatal visits. Keep all your prenatal visits as told by your health  care provider. This is important. Safety Wear your seat belt at all times when driving. Make a list of emergency phone numbers, including numbers for family, friends, the hospital, and police and fire departments. General instructions Ask your health care provider for a referral to a local prenatal education class. Begin classes no later than the beginning of month 6 of your pregnancy. Ask for help if you have counseling or nutritional needs during pregnancy. Your health care provider can offer advice or refer you to specialists for help with various needs. Do not use hot tubs, steam rooms, or saunas. Do not douche or use tampons or scented sanitary pads. Do not cross your legs for long periods of time. Avoid cat litter boxes and soil used by cats. These carry germs that can cause birth defects in the baby and possibly loss of the  fetus by miscarriage or stillbirth. Avoid all smoking, herbs, alcohol, and unprescribed drugs. Chemicals in these products can affect the formation and growth of the baby. Do not use any products that contain nicotine  or tobacco, such as cigarettes and e-cigarettes. If you need help quitting, ask your health care provider. Visit your dentist if you have not gone yet during your pregnancy. Use a soft toothbrush to brush your teeth and be gentle when you floss. Contact a health care provider if: You have dizziness. You have mild pelvic cramps, pelvic pressure, or nagging pain in the abdominal area. You have persistent nausea, vomiting, or diarrhea. You have a bad smelling vaginal discharge. You have pain when you urinate. Get help right away if: You have a fever. You are leaking fluid from your vagina. You have spotting or bleeding from your vagina. You have severe abdominal cramping or pain. You have rapid weight gain or weight loss. You have shortness of breath with chest pain. You notice sudden or extreme swelling of your face, hands, ankles, feet, or legs. You  have not felt your baby move in over an hour. You have severe headaches that do not go away when you take medicine. You have vision changes. Summary The second trimester is from week 14 through week 27 (months 4 through 6). It is also a time when the fetus is growing rapidly. Your body goes through many changes during pregnancy. The changes vary from woman to woman. Avoid all smoking, herbs, alcohol, and unprescribed drugs. These chemicals affect the formation and growth your baby. Do not use any tobacco products, such as cigarettes, chewing tobacco, and e-cigarettes. If you need help quitting, ask your health care provider. Contact your health care provider if you have any questions. Keep all prenatal visits as told by your health care provider. This is important. This information is not intended to replace advice given to you by your health care provider. Make sure you discuss any questions you have with your health care provider. Document Released: 03/19/2001 Document Revised: 08/31/2015 Document Reviewed: 05/26/2012 Elsevier Interactive Patient Education  2017 [employer]   For Headaches:  Stay well hydrated, drink enough water so that your urine is clear, sometimes if you are dehydrated you can get headaches Eat small frequent meals and snacks, sometimes if you are hungry you can get headaches Sometimes you get headaches during pregnancy from the pregnancy hormones You can try tylenol  (1-2 regular strength 325mg  or 1-2 extra strength 500mg ) as directed on the box. The least amount of medication that works is best.  Cool compresses (cool wet washcloth or ice pack) to area of head that is hurting You can also try drinking a caffeinated drink to see if this will help If not helping, try below:  For Prevention of Headaches/Migraines: CoQ10 100mg  three times daily Vitamin B2 400mg  daily Magnesium  Oxide 400-600mg  daily  Foods to alleviate migraines:  1) dark leafy greens 2) avocado 3)  tuna 4) salmon  5) beans and legumes  Foods to avoid: 1) Excessive (or irregular timing) coffee 3) aged cheeses 4) chocolate 5) citrus fruits 6) aspartame and other artifical sweeteners 7) yeast 8) MSG (in processed foods) 9) processed and cured meats 10) nuts and certain seeds 11) chicken livers and other organ meats 12) dairy products like buttermilk, sour cream, and yogurt 13) dried fruits like dates, figs, and raisins 14) garlic 15) onions 16) potato chips 17) pickled foods like olives and sauerkraut 18) some fresh fruits like ripe banana,  papaya, red plums, raspberries, kiwi, pineapple 19) tomato-based products  Recommend to keep a migraine diary: rate daily the severity of your headache (1-10) and what foods you eat that day to help determine patterns.   If You Get a Bad Headache/Migraine: Benadryl  25mg   Magnesium  Oxide 1 large Gatorade 2 extra strength Tylenol  (1,000mg  total) 1 cup coffee or Coke      If this doesn't help please call us  @ [phone]

## 2023-05-18 LAB — INTEGRATED 2
AFP MoM: 0.95
Alpha-Fetoprotein: 23.5 ng/mL
Crown Rump Length: 78 mm
DIA MoM: 0.6
DIA Value: 84.9 pg/mL
Estriol, Unconjugated: 0.81 ng/mL
Gest. Age on Collection Date: 13.7 wk
Gestational Age: 15.9 wk
Maternal Age at EDD: 26 a
Nuchal Translucency (NT): 1.9 mm
Nuchal Translucency MoM: 0.86
Number of Fetuses: 1
PAPP-A MoM: 1.52
PAPP-A Value: 1569.2 ng/mL
Sonographer ID#: 309760
Test Results:: NEGATIVE
Weight: 200 [lb_av]
Weight: 200 [lb_av]
hCG MoM: 0.7
hCG Value: 22 [IU]/mL
uE3 MoM: 1.1

## 2023-05-18 LAB — URINE CULTURE

## 2023-05-19 ENCOUNTER — Encounter: Payer: Self-pay | Admitting: Women's Health

## 2023-05-19 DIAGNOSIS — R8271 Bacteriuria: Secondary | ICD-10-CM | POA: Insufficient documentation

## 2023-05-19 MED ORDER — AMOXICILLIN 500 MG PO CAPS: 500.0000 mg | ORAL_CAPSULE | Freq: Two times a day (BID) | ORAL | 0 refills | Status: DC

## 2023-05-19 NOTE — Addendum Note (Signed)
 Addended by: Ferd Householder on: 05/19/2023 11:49 AM   Modules accepted: Orders

## 2023-05-20 LAB — GC/CHLAMYDIA PROBE AMP
Chlamydia trachomatis, NAA: NEGATIVE
Neisseria Gonorrhoeae by PCR: NEGATIVE

## 2023-05-21 ENCOUNTER — Encounter: Payer: Self-pay | Admitting: Women's Health

## 2023-05-23 ENCOUNTER — Telehealth: Payer: Self-pay | Admitting: *Deleted

## 2023-05-23 NOTE — Telephone Encounter (Signed)
Called patient regarding UTI. Unable to leave VM

## 2023-05-29 ENCOUNTER — Inpatient Hospital Stay (HOSPITAL_BASED_OUTPATIENT_CLINIC_OR_DEPARTMENT_OTHER): Payer: Medicaid Other

## 2023-05-29 ENCOUNTER — Other Ambulatory Visit: Payer: Self-pay

## 2023-05-29 ENCOUNTER — Encounter (HOSPITAL_COMMUNITY): Payer: Self-pay | Admitting: Emergency Medicine

## 2023-05-29 ENCOUNTER — Telehealth: Payer: Self-pay | Admitting: *Deleted

## 2023-05-29 ENCOUNTER — Inpatient Hospital Stay (HOSPITAL_COMMUNITY)
Admission: EM | Admit: 2023-05-29 | Discharge: 2023-05-29 | Disposition: A | Discharge status: Home / Self Care | Payer: Medicaid Other | Attending: Obstetrics and Gynecology | Admitting: Obstetrics and Gynecology

## 2023-05-29 DIAGNOSIS — Z3480 Encounter for supervision of other normal pregnancy, unspecified trimester: Secondary | ICD-10-CM

## 2023-05-29 DIAGNOSIS — R102 Pelvic and perineal pain: Secondary | ICD-10-CM | POA: Diagnosis not present

## 2023-05-29 DIAGNOSIS — O99891 Other specified diseases and conditions complicating pregnancy: Secondary | ICD-10-CM | POA: Diagnosis not present

## 2023-05-29 DIAGNOSIS — O09292 Supervision of pregnancy with other poor reproductive or obstetric history, second trimester: Secondary | ICD-10-CM | POA: Insufficient documentation

## 2023-05-29 DIAGNOSIS — N898 Other specified noninflammatory disorders of vagina: Secondary | ICD-10-CM | POA: Diagnosis not present

## 2023-05-29 DIAGNOSIS — O4702 False labor before 37 completed weeks of gestation, second trimester: Secondary | ICD-10-CM | POA: Diagnosis not present

## 2023-05-29 DIAGNOSIS — Z3A18 18 weeks gestation of pregnancy: Secondary | ICD-10-CM

## 2023-05-29 DIAGNOSIS — O26892 Other specified pregnancy related conditions, second trimester: Secondary | ICD-10-CM | POA: Diagnosis not present

## 2023-05-29 DIAGNOSIS — O4700 False labor before 37 completed weeks of gestation, unspecified trimester: Secondary | ICD-10-CM | POA: Diagnosis present

## 2023-05-29 DIAGNOSIS — O26899 Other specified pregnancy related conditions, unspecified trimester: Secondary | ICD-10-CM

## 2023-05-29 DIAGNOSIS — Z0371 Encounter for suspected problem with amniotic cavity and membrane ruled out: Secondary | ICD-10-CM | POA: Diagnosis present

## 2023-05-29 DIAGNOSIS — R55 Syncope and collapse: Secondary | ICD-10-CM | POA: Diagnosis present

## 2023-05-29 LAB — COMPREHENSIVE METABOLIC PANEL
ALT: 14 U/L (ref 0–44)
AST: 14 U/L — ABNORMAL LOW (ref 15–41)
Albumin: 3.2 g/dL — ABNORMAL LOW (ref 3.5–5.0)
Alkaline Phosphatase: 41 U/L (ref 38–126)
Anion gap: 8 (ref 5–15)
BUN: 11 mg/dL (ref 6–20)
CO2: 21 mmol/L — ABNORMAL LOW (ref 22–32)
Calcium: 8.7 mg/dL — ABNORMAL LOW (ref 8.9–10.3)
Chloride: 106 mmol/L (ref 98–111)
Creatinine, Ser: 0.65 mg/dL (ref 0.44–1.00)
GFR, Estimated: >60 mL/min (ref >60)
Glucose, Bld: 83 mg/dL (ref 70–99)
Potassium: 3.6 mmol/L (ref 3.5–5.1)
Sodium: 135 mmol/L (ref 135–145)
Total Bilirubin: 0.4 mg/dL (ref 0.0–1.2)
Total Protein: 6.8 g/dL (ref 6.5–8.1)

## 2023-05-29 LAB — CBC WITH DIFFERENTIAL/PLATELET
Abs Immature Granulocytes: 0.04 10*3/uL (ref 0.00–0.07)
Basophils Absolute: 0 10*3/uL (ref 0.0–0.1)
Basophils Relative: 0 %
Eosinophils Absolute: 0.1 10*3/uL (ref 0.0–0.5)
Eosinophils Relative: 1 %
HCT: 39.8 % (ref 36.0–46.0)
Hemoglobin: 14 g/dL (ref 12.0–15.0)
Immature Granulocytes: 1 %
Lymphocytes Relative: 19 %
Lymphs Abs: 1.6 10*3/uL (ref 0.7–4.0)
MCH: 31 pg (ref 26.0–34.0)
MCHC: 35.2 g/dL (ref 30.0–36.0)
MCV: 88.2 fL (ref 80.0–100.0)
Monocytes Absolute: 0.5 10*3/uL (ref 0.1–1.0)
Monocytes Relative: 6 %
Neutro Abs: 6.3 10*3/uL (ref 1.7–7.7)
Neutrophils Relative %: 73 %
Platelets: 237 10*3/uL (ref 150–400)
RBC: 4.51 MIL/uL (ref 3.87–5.11)
RDW: 12.8 % (ref 11.5–15.5)
WBC: 8.6 10*3/uL (ref 4.0–10.5)
nRBC: 0 % (ref 0.0–0.2)

## 2023-05-29 LAB — URINALYSIS, ROUTINE W REFLEX MICROSCOPIC
Bilirubin Urine: NEGATIVE
Glucose, UA: NEGATIVE mg/dL
Hgb urine dipstick: NEGATIVE
Ketones, ur: 20 mg/dL — AB
Leukocytes,Ua: NEGATIVE
Nitrite: NEGATIVE
Protein, ur: NEGATIVE mg/dL
Specific Gravity, Urine: 1.018 (ref 1.005–1.030)
pH: 6 (ref 5.0–8.0)

## 2023-05-29 LAB — WET PREP, GENITAL
Clue Cells Wet Prep HPF POC: NONE SEEN
Sperm: NONE SEEN
Trich, Wet Prep: NONE SEEN
WBC, Wet Prep HPF POC: <10 (ref <10)
Yeast Wet Prep HPF POC: NONE SEEN

## 2023-05-29 LAB — POCT FERN TEST

## 2023-05-29 LAB — RUPTURE OF MEMBRANE (ROM)PLUS: Rom Plus: NEGATIVE

## 2023-05-29 LAB — CBG MONITORING, ED: Glucose-Capillary: 80 mg/dL (ref 70–99)

## 2023-05-29 MED ORDER — LACTATED RINGERS IV BOLUS
1000.0000 mL | Freq: Once | INTRAVENOUS | Status: AC
  Administered 2023-05-29: 1000 mL via INTRAVENOUS

## 2023-05-29 MED ORDER — INDOMETHACIN 25 MG PO CAPS
25.0000 mg | ORAL_CAPSULE | ORAL | Status: DC
  Administered 2023-05-29: 25 mg via ORAL
  Filled 2023-05-29 (×4): qty 1

## 2023-05-29 MED ORDER — ACETAMINOPHEN 500 MG PO TABS
1000.0000 mg | ORAL_TABLET | Freq: Once | ORAL | Status: AC
  Administered 2023-05-29: 1000 mg via ORAL
  Filled 2023-05-29: qty 2

## 2023-05-29 MED ORDER — INDOMETHACIN 25 MG PO CAPS: 25.0000 mg | ORAL_CAPSULE | ORAL | 0 refills | Status: AC

## 2023-05-29 NOTE — ED Triage Notes (Addendum)
Pt states she is [redacted] weeks pregnant, had gush of fluid come out about 12 today. C/o lower abd pain with spotting since. EDD 10/29/23. A/o. Edp aware 2nd pregnany with first being born at 31 weeks Pt c/o "passing out spells" with dizziness around 12 also. Pt clammy in triage

## 2023-05-29 NOTE — ED Notes (Signed)
Report given to Rn NICOLE

## 2023-05-29 NOTE — ED Provider Notes (Signed)
[place] EMERGENCY DEPARTMENT AT [facility] Provider Note   CSN: [id number] Arrival date & time: 05/29/23  1304     History  Chief Complaint  Patient presents with   Contractions    Pamela Mccarty is a 28 y.o. female.  HPI 28 year old female who is G2, P1 at 18.1 weeks presents with concern for preterm labor.  She noticed a small amount of vaginal spotting (mucous plug?) and then a large gush of fluid around noon and has had contractions since.  She has had contractions lasting about 3 minutes at a time every 10-15 minutes since the gush of fluid.  She is also having some diffuse abdominal tenderness.  She has felt the baby move.  Home Medications Prior to Admission medications   Medication Sig Start Date End Date Taking? Authorizing Provider  acetaminophen (TYLENOL) 500 MG tablet Take 500 mg by mouth every 4 (four) hours as needed for moderate pain (pain score 4-6) or fever.    [provider]  amoxicillin (AMOXIL) 500 MG capsule Take 1 capsule (500 mg total) by mouth 2 (two) times daily. X 7 days 05/19/23   Cheral Marker, CNM  Blood Pressure Monitor MISC For regular home bp monitoring during pregnancy Patient not taking: Reported on 05/15/2023 05/15/23   Cheral Marker, CNM  Doxylamine-Pyridoxine (DICLEGIS) 10-10 MG TBEC Take 2 qhs; may also take one in am and one in afternoon prn nausea Patient not taking: Reported on 05/15/2023 05/08/23   Cresenzo-Dishmon, Scarlette Calico, CNM  ondansetron (ZOFRAN) 4 MG tablet Take 1 tablet (4 mg total) by mouth every 6 (six) hours. Patient not taking: Reported on 05/15/2023 05/08/23   Jacklyn Shell, CNM  prenatal vitamin w/FE, FA (PRENATAL 1 + 1) 27-1 MG TABS tablet Take 1 tablet by mouth daily at 12 noon. 05/15/23   Cheral Marker, CNM  sertraline (ZOLOFT) 25 MG tablet Take 1 tablet (25 mg total) by mouth daily. 05/15/23   Cheral Marker, CNM      Allergies    Peanut-containing drug products, Latex, Soy  allergy (obsolete), and Tomato    Review of Systems   Review of Systems  Gastrointestinal:  Positive for abdominal pain.  Genitourinary:  Positive for vaginal bleeding. Negative for dysuria.    Physical Exam Updated Vital Signs BP 127/79   Pulse 96   Temp 98.7 F (37.1 C) (Oral)   Resp 18   LMP 01/07/2023   SpO2 100%  Physical Exam Vitals and nursing note reviewed.  Constitutional:      Appearance: She is well-developed.  HENT:     Head: Normocephalic and atraumatic.  Cardiovascular:     Rate and Rhythm: Normal rate and regular rhythm.  Pulmonary:     Effort: Pulmonary effort is normal.  Abdominal:     General: There is no distension.     Tenderness: There is abdominal tenderness.  Skin:    General: Skin is warm and dry.  Neurological:     Mental Status: She is alert.     ED Results / Procedures / Treatments   Labs (all labs ordered are listed, but only abnormal results are displayed) Labs Reviewed  COMPREHENSIVE METABOLIC PANEL - Abnormal; Notable for the following components:      Result Value   CO2 21 (*)    Calcium 8.7 (*)    Albumin 3.2 (*)    AST 14 (*)    All other components within normal limits  CBC WITH DIFFERENTIAL/PLATELET  URINALYSIS,  ROUTINE W REFLEX MICROSCOPIC  CBG MONITORING, ED    EKG None  Radiology No results found.  Procedures Procedures    Medications Ordered in ED Medications - No data to display  ED Course/ Medical Decision Making/ A&P                                 Medical Decision Making Amount and/or Complexity of Data Reviewed Labs: ordered.    Details: Normal hemoglobin  Risk Decision regarding hospitalization.   Patient presents with concern for preterm labor. Discussed with Dr. Debroah Loop of OBGYN, who advises no specific treatment but will need to be monitored in the MAU. He accepts in transfer. She is otherwise stable. FHR ~ 160. Stable for transfer.         Final Clinical Impression(s) / ED  Diagnoses Final diagnoses:  Preterm contractions    Rx / DC Orders ED Discharge Orders     None         Pricilla Loveless, MD 05/29/23 1418

## 2023-05-29 NOTE — Discharge Instructions (Signed)

## 2023-05-29 NOTE — ED Notes (Signed)
MAU called, per Dr Scheryl Darter send pt to MAU. DR Criss Alvine aware.

## 2023-05-29 NOTE — Telephone Encounter (Signed)
Called patient to inform of UTI.  Unable to leave message.

## 2023-05-29 NOTE — MAU Provider Note (Addendum)
Transfer from [facility] for concern for PPROM, PTL    S [name] is a 28 y.o. [phone] pregnant female at [redacted]w[redacted]d who presents to [facility]U as EMS [facility] from [facility] today with complaint of PTL c/b LOF. Pt states that she noticed a small amount of vaginal spotting thinking it was her mucus plus being lost followed shortly by a large gush of fluid around 1200 2/20.  States has had trickling leakage since and started having contractions ever since.  She states the contractions last about q10-72mins.  Denies further VB.  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 Worried about length" and that "they wanted to hospitalize me in case I had PTL" but I don't see documentation supporting this.   Receives care at [facility]. Prenatal records reviewed.  Pertinent items noted in HPI and remainder of comprehensive ROS otherwise negative.   O BP 127/79   Pulse 96   Temp 98.7 F (37.1 C) (Oral)   Resp 18   LMP 01/07/2023   SpO2 100%  Physical Exam Vitals and nursing note reviewed. Exam conducted with a chaperone present.  Constitutional:      General: She is not in acute distress.    Appearance: She is not ill-appearing.     Comments: tearful  HENT:     Head: Normocephalic and atraumatic.     Right Ear: External ear normal.     Left Ear: External ear normal.     Nose: Nose normal.     Mouth/Throat:     Mouth: Mucous membranes are moist.     Pharynx: Oropharynx is clear.  Eyes:     Extraocular Movements: Extraocular movements intact.     Conjunctiva/sclera: Conjunctivae normal.  Cardiovascular:     Rate and Rhythm: Normal rate.  Pulmonary:     Effort: Pulmonary effort is normal. No respiratory distress.  Abdominal:     General: Abdomen is flat. There is no distension.     Palpations: Abdomen is soft.  Genitourinary:    General: Normal vulva.     Comments: No pooling, no internal lesions, slight mucous trailing from external os, tunneling closed internal os on CVE with  about 50% effacement, -3 station Musculoskeletal:        General: No swelling. Normal range of motion.     Cervical back: Normal range of motion.  Skin:    General: Skin is warm and dry.  Neurological:     Mental Status: She is alert and oriented to person, place, and time. Mental status is at baseline.  Psychiatric:        Mood and Affect: Mood normal.        Behavior: Behavior normal.      MDM: MAU Course: [facility]: CBC no leukocytosis, normal plts, Hgb 14.0 CMP reassuring, Cr 0.65  MAU: Fern negative  Wet prep negative  GC Collected ROM plus negative  Ordered for fluid bolus, LR 1 L  UA collected prior to dc  On chart review appears pt declined Prometrium however I don't see any documentation of cervical length. Spoke with 2nd Attending Dr. Debroah Loop and he agrees with [place] for AFI.   AFI normal, dating well with 150 FHT   Spoke to Dr. Donavan Foil (overnight attending) who states given objective data reassuring pt stable for d/c with plans to do indomethacin q4hrs for 48hrs.  Pt agreeable.  Will given Tylenol 1g for mild HA and first dose of indomethacin prior to d/c.  [facility] #[redacted] weeks gestation #hx of PTD @31  weeks  #Pelvic cramping - message to [facility] staffing for earlier OB f/u appt - Indomethacin 25mg  q4hrs for 48hrs    Discharge from MAU in stable condition with strict/usual precautions Follow up at [facility] as scheduled for ongoing prenatal care  Allergies as of 05/29/2023       Reactions   Peanut-containing Drug Products Anaphylaxis   Stomach bubbles with Peanut oil only (Patient states that she is not allergic to this)   Latex Other (See Comments)   Redness at site   Soy Allergy (obsolete) Hives   Tomato    Throat swelling, shortness of breath        Medication List     TAKE these medications    acetaminophen 500 MG tablet Commonly known as: TYLENOL Take 500 mg by mouth every 4 (four) hours as needed for moderate pain (pain score 4-6) or fever.   amoxicillin 500 MG  capsule Commonly known as: AMOXIL Take 1 capsule (500 mg total) by mouth 2 (two) times daily. X 7 days   Blood Pressure Monitor Misc For regular home bp monitoring during pregnancy   Doxylamine-Pyridoxine 10-10 MG Tbec Commonly known as: Diclegis Take 2 qhs; may also take one in am and one in afternoon prn nausea   indomethacin 25 MG capsule Commonly known as: INDOCIN Take 1 capsule (25 mg total) by mouth every 4 (four) hours for 2 days.   ondansetron 4 MG tablet Commonly known as: ZOFRAN Take 1 tablet (4 mg total) by mouth every 6 (six) hours.   prenatal vitamin w/FE, FA 27-1 MG Tabs tablet Take 1 tablet by mouth daily at 12 noon.   sertraline 25 MG tablet Commonly known as: Zoloft Take 1 tablet (25 mg total) by mouth daily.        Hessie Dibble, MD 05/29/2023 6:50 PM

## 2023-05-30 LAB — GC/CHLAMYDIA PROBE AMP (~~LOC~~) NOT AT ARMC
Chlamydia: NEGATIVE
Comment: NEGATIVE
Comment: NORMAL
Neisseria Gonorrhea: NEGATIVE

## 2023-06-01 ENCOUNTER — Other Ambulatory Visit: Payer: Self-pay

## 2023-06-01 ENCOUNTER — Encounter (HOSPITAL_COMMUNITY): Payer: Self-pay | Admitting: Obstetrics and Gynecology

## 2023-06-01 ENCOUNTER — Inpatient Hospital Stay (HOSPITAL_COMMUNITY)
Admission: AD | Admit: 2023-06-01 | Discharge: 2023-06-01 | Disposition: A | Discharge status: Home / Self Care | Payer: Medicaid Other | Attending: Obstetrics and Gynecology | Admitting: Obstetrics and Gynecology

## 2023-06-01 DIAGNOSIS — R1110 Vomiting, unspecified: Secondary | ICD-10-CM | POA: Diagnosis present

## 2023-06-01 DIAGNOSIS — Z3A18 18 weeks gestation of pregnancy: Secondary | ICD-10-CM | POA: Diagnosis not present

## 2023-06-01 DIAGNOSIS — N949 Unspecified condition associated with female genital organs and menstrual cycle: Secondary | ICD-10-CM | POA: Diagnosis not present

## 2023-06-01 DIAGNOSIS — R109 Unspecified abdominal pain: Secondary | ICD-10-CM | POA: Diagnosis present

## 2023-06-01 DIAGNOSIS — O26892 Other specified pregnancy related conditions, second trimester: Secondary | ICD-10-CM | POA: Diagnosis not present

## 2023-06-01 DIAGNOSIS — O368130 Decreased fetal movements, third trimester, not applicable or unspecified: Secondary | ICD-10-CM | POA: Insufficient documentation

## 2023-06-01 DIAGNOSIS — R1084 Generalized abdominal pain: Secondary | ICD-10-CM | POA: Diagnosis not present

## 2023-06-01 DIAGNOSIS — O368121 Decreased fetal movements, second trimester, fetus 1: Secondary | ICD-10-CM | POA: Diagnosis not present

## 2023-06-01 DIAGNOSIS — Z3480 Encounter for supervision of other normal pregnancy, unspecified trimester: Secondary | ICD-10-CM

## 2023-06-01 DIAGNOSIS — R197 Diarrhea, unspecified: Secondary | ICD-10-CM | POA: Diagnosis present

## 2023-06-01 LAB — URINALYSIS, ROUTINE W REFLEX MICROSCOPIC
Bilirubin Urine: NEGATIVE
Glucose, UA: NEGATIVE mg/dL
Hgb urine dipstick: NEGATIVE
Ketones, ur: NEGATIVE mg/dL
Leukocytes,Ua: NEGATIVE
Nitrite: NEGATIVE
Protein, ur: 30 mg/dL — AB
Specific Gravity, Urine: 1.028 (ref 1.005–1.030)
pH: 5 (ref 5.0–8.0)

## 2023-06-01 MED ORDER — ONDANSETRON 4 MG PO TBDP
8.0000 mg | ORAL_TABLET | Freq: Once | ORAL | Status: AC
  Administered 2023-06-01: 8 mg via ORAL
  Filled 2023-06-01: qty 2

## 2023-06-01 MED ORDER — CYCLOBENZAPRINE HCL 5 MG PO TABS
10.0000 mg | ORAL_TABLET | Freq: Once | ORAL | Status: AC
  Administered 2023-06-01: 10 mg via ORAL
  Filled 2023-06-01: qty 2

## 2023-06-01 MED ORDER — ACETAMINOPHEN 500 MG PO TABS
1000.0000 mg | ORAL_TABLET | Freq: Once | ORAL | Status: AC
  Administered 2023-06-01: 1000 mg via ORAL
  Filled 2023-06-01: qty 2

## 2023-06-01 MED ORDER — ONDANSETRON HCL 4 MG PO TABS: 8.0000 mg | ORAL_TABLET | Freq: Two times a day (BID) | ORAL | 0 refills | Status: DC

## 2023-06-01 MED ORDER — CYCLOBENZAPRINE HCL 10 MG PO TABS: 10.0000 mg | ORAL_TABLET | Freq: Two times a day (BID) | ORAL | 0 refills | Status: DC | PRN

## 2023-06-01 NOTE — MAU Note (Signed)
.  Pamela Mccarty is a 28 y.o. at [redacted]w[redacted]d here in MAU reporting: worsening abd pain that started Friday.  Pt reports she was started on a medication in mau for the cramps. Pt reports since starting the med she has been vomiting and having episodes of diarrhea.  Reports light spotting since Thursday.     Onset of complaint: Thursday Pain score: 10 Vitals:   06/01/23 1754  BP: 115/69  Pulse: 97  Resp: 17  Temp: 98 F (36.7 C)  SpO2: 100%      Lab orders placed from triage: ua

## 2023-06-01 NOTE — Discharge Instructions (Signed)
 Safe Medications in Pregnancy   Acne:  Benzoyl Peroxide  Salicylic Acid   Backache/Headache:  Tylenol: 2 regular strength every 4 hours OR               2 Extra strength every 6 hours   Colds/Coughs/Allergies:  Benadryl (alcohol free) 25 mg every 6 hours as needed  Breath right strips  Claritin  Cepacol throat lozenges  Chloraseptic throat spray  Cold-Eeze- up to three times per day  Cough drops, alcohol free  Flonase (by prescription only)  Guaifenesin  Mucinex  Robitussin DM (plain only, alcohol free)  Saline nasal spray/drops  Sudafed (pseudoephedrine) & Actifed * use only after [redacted] weeks gestation and if you do not have high blood pressure  Tylenol  Vicks Vaporub  Zinc lozenges  Zyrtec   Constipation:  Colace  Ducolax suppositories  Fleet enema  Glycerin suppositories  Metamucil  Milk of magnesia  Miralax  Senokot  Smooth move tea   Diarrhea:  Kaopectate  Imodium A-D   *NO pepto Bismol   Hemorrhoids:  Anusol  Anusol HC  Preparation H  Tucks   Indigestion:  Tums  Maalox  Mylanta  Zantac  Pepcid   Insomnia:  Benadryl (alcohol free) 25mg  every 6 hours as needed  Tylenol PM  Unisom, no Gelcaps   Leg Cramps:  Tums  MagGel   Nausea/Vomiting:  Bonine  Dramamine  Emetrol  Ginger extract  Sea bands  Meclizine  Nausea medication to take during pregnancy:  Unisom (doxylamine succinate 25 mg tablets) Take one tablet daily at bedtime. If symptoms are not adequately controlled, the dose can be increased to a maximum recommended dose of two tablets daily (1/2 tablet in the morning, 1/2 tablet mid-afternoon and one at bedtime).  Vitamin B6 100mg  tablets. Take one tablet twice a day (up to 200 mg per day).   Skin Rashes:  Aveeno products  Benadryl cream or 25mg  every 6 hours as needed  Calamine Lotion  1% cortisone cream   Yeast infection:  Gyne-lotrimin 7  Monistat 7    **If taking multiple medications, please check labels to avoid  duplicating the same active ingredients  **take medication as directed on the label  ** Do not exceed 4000 mg of tylenol in 24 hours  **Do not take medications that contain aspirin or ibuprofen            .lc

## 2023-06-01 NOTE — MAU Provider Note (Signed)
 Chief Complaint:  Abdominal Pain, Back Pain, and Emesis   HPI   Event Date/Time   First Provider Initiated Contact with Patient 06/01/23 1833      Pamela Mccarty is a 28 y.o. [phone] at [redacted]w[redacted]d who presents to maternity admissions reporting numerous complaints which included, DFM at [redacted] weeks GA, lower abdominal pain, lower back pain, and  N/V/D . She has been seen previously in MAU and has medication at home for Hyperemesis and was last given a course of indocin which the patient reported made her feel more uncomfortable.   Pregnancy Course: [facility]  Past Medical History:  Diagnosis Date   ADHD (attention deficit hyperactivity disorder)    Anxiety    Asthma    inhaler as a child   Depression    Epilepsy (HCC)    Headache(784.0)    Obesity    Seizures (HCC)    Seizures (HCC) 02/02/2021   per pt; 2/2 post partum blood loss   OB History  Gravida Para Term Preterm AB Living  4 1  1 2 1   SAB IAB Ectopic Multiple Live Births  2   0 1    # Outcome Date GA Lbr Len/2nd Weight Sex Type Anes PTL Lv  4 Current           3 Preterm 01/30/21 [redacted]w[redacted]d 28:45 / 00:06 1570 g F Vag-Spont EPI  LIV     Complications: Preterm premature rupture of membranes  2 SAB           1 SAB            Past Surgical History:  Procedure Laterality Date   ADENOIDECTOMY     ANKLE SURGERY     ANKLE SURGERY  at 28yo   Pt. reports to lengthen her tendons   TONSILLECTOMY     Family History  Problem Relation Age of Onset   Heart disease Maternal Grandfather    Heart attack Maternal Grandfather    Hypertension Father    Stroke Father    Seizures Father    Bipolar disorder Mother    Cancer Mother    Diabetes Mother    Hypertension Mother    Seizures Mother    Seizures Brother    Social History   Tobacco Use   Smoking status: Former    Current packs/day: 0.25    Types: Cigarettes   Smokeless tobacco: Never  Vaping Use   Vaping status: Never Used  Substance Use Topics   Alcohol use: No    Drug use: Not Currently    Types: Marijuana    Comment: last used october 2024   Allergies  Allergen Reactions   Peanut-Containing Drug Products Anaphylaxis    Stomach bubbles with Peanut oil only  (Patient states that she is not allergic to this)   Latex Other (See Comments)    Redness at site   Soy Allergy (Obsolete) Hives   Tomato     Throat swelling, shortness of breath    Medications Prior to Admission  Medication Sig Dispense Refill Last Dose/Taking   acetaminophen (TYLENOL) 500 MG tablet Take 500 mg by mouth every 4 (four) hours as needed for moderate pain (pain score 4-6) or fever.   06/01/2023 Morning   ondansetron (ZOFRAN) 4 MG tablet Take 1 tablet (4 mg total) by mouth every 6 (six) hours. 30 tablet 2 06/01/2023 Morning   prenatal vitamin w/FE, FA (PRENATAL 1 + 1) 27-1 MG TABS tablet Take 1 tablet by mouth  daily at 12 noon. 90 tablet 3 06/01/2023   amoxicillin (AMOXIL) 500 MG capsule Take 1 capsule (500 mg total) by mouth 2 (two) times daily. X 7 days 14 capsule 0    Blood Pressure Monitor MISC For regular home bp monitoring during pregnancy (Patient not taking: Reported on 05/15/2023) 1 each 0    Doxylamine-Pyridoxine (DICLEGIS) 10-10 MG TBEC Take 2 qhs; may also take one in am and one in afternoon prn nausea (Patient not taking: Reported on 05/15/2023) 120 tablet 6    sertraline (ZOLOFT) 25 MG tablet Take 1 tablet (25 mg total) by mouth daily. 30 tablet 6     I have reviewed patient's Past Medical Hx, Surgical Hx, Family Hx, Social Hx, medications and allergies.   ROS  Pertinent items noted in HPI and remainder of comprehensive ROS otherwise negative.   PHYSICAL EXAM  Patient Vitals for the past 24 hrs:  BP Temp Temp src Pulse Resp SpO2  06/01/23 1843 113/63 -- -- 88 (P) 18 (P) 98 %  06/01/23 1754 115/69 98 F (36.7 C) Oral 97 17 100 %    Constitutional: Well-developed, well-nourished female in no acute distress but appears very anxious Cardiovascular: normal rate &  rhythm, warm and well-perfused Respiratory: normal effort, no problems with respiration noted GI: Abd soft, non-tender, gravid MS: Extremities nontender, no edema, normal ROM Neurologic: Alert and oriented x 4.  GU: no CVA tenderness Pelvic: Deferred     Fetal Tracing: 168 on ultrasound    Labs: Results for orders placed or performed during the hospital encounter of 06/01/23 (from the past 24 hours)  Urinalysis, Routine w reflex microscopic -Urine, Clean Catch     Status: Abnormal   Collection Time: 06/01/23  6:58 PM  Result Value Ref Range   Color, Urine AMBER (A) YELLOW   APPearance HAZY (A) CLEAR   Specific Gravity, Urine 1.028 1.005 - 1.030   pH 5.0 5.0 - 8.0   Glucose, UA NEGATIVE NEGATIVE mg/dL   Hgb urine dipstick NEGATIVE NEGATIVE   Bilirubin Urine NEGATIVE NEGATIVE   Ketones, ur NEGATIVE NEGATIVE mg/dL   Protein, ur 30 (A) NEGATIVE mg/dL   Nitrite NEGATIVE NEGATIVE   Leukocytes,Ua NEGATIVE NEGATIVE   RBC / HPF 0-5 0 - 5 RBC/hpf   WBC, UA 0-5 0 - 5 WBC/hpf   Bacteria, UA RARE (A) NONE SEEN   Squamous Epithelial / HPF 6-10 0 - 5 /HPF   Mucus PRESENT    Ca Oxalate Crys, UA PRESENT     Imaging: BSUS   Pt informed that the ultrasound is considered a limited OB ultrasound and is not intended to be a complete ultrasound exam.  Patient also informed that the ultrasound is not being completed with the intent of assessing for fetal or placental anomalies or any pelvic abnormalities.  Explained that the purpose of today's ultrasound is to assess for   maternal reassurance and viability.  Patient acknowledges the purpose of the exam and the limitations of the study.   Patient showed reassurance after seeing fetus on ultrasound and movements observed  MDM & MAU COURSE  MDM:   HIGH  - Tylenol/Flexeril for pain - Zofran for N/V - UA unremarkable - BSUS performed - Reassurance provided    I have reviewed the patient chart and performed the physical exam . I have ordered  & interpreted the lab results and reviewed and interpreted the bedside ultrasound images with Fetal movements observed and AAFV . Medications ordered as stated below.  A/P as described below.  Counseling and education provided and patient agreeable  with plan as described below. Verbalized understanding.    MAU Course: Orders Placed This Encounter  Procedures   Urinalysis, Routine w reflex microscopic -Urine, Clean Catch   Discharge patient Discharge disposition: 01-Home or Self Care; Discharge patient date: 06/01/2023   Discharge patient Discharge disposition: 01-Home or Self Care; Discharge patient date: 06/01/2023   Discharge patient Discharge disposition: 01-Home or Self Care; Discharge patient date: 06/01/2023   Meds ordered this encounter  Medications   acetaminophen (TYLENOL) tablet 1,000 mg   cyclobenzaprine (FLEXERIL) tablet 10 mg   ondansetron (ZOFRAN-ODT) disintegrating tablet 8 mg   cyclobenzaprine (FLEXERIL) 10 MG tablet    Sig: Take 1 tablet (10 mg total) by mouth 2 (two) times daily as needed for muscle spasms.    Dispense:  20 tablet    Refill:  0    Supervising Provider:   Reva Bores [[id number]]   ondansetron (ZOFRAN) 4 MG tablet    Sig: Take 2 tablets (8 mg total) by mouth 2 (two) times daily.    Dispense:  20 tablet    Refill:  0    Supervising Provider:   Reva Bores [[id number]]    ASSESSMENT   1. Supervision of other normal pregnancy, antepartum   2. Decreased fetal movements in second trimester, fetus 1 of multiple gestation   3. Generalized abdominal pain   4. Round ligament pain   5. [redacted] weeks gestation of pregnancy     PLAN  Discharge home in stable condition with return precautions.     Follow-up Information     [facility] for [facility] Follow up.   Specialty: Obstetrics and Gynecology Why: If symptoms worsen or fail to resolve, As scheduled for ongoing prenatal care Contact information: [address] [place] [address] [phone]                Allergies as of 06/01/2023       Reactions   Peanut-containing Drug Products Anaphylaxis   Stomach bubbles with Peanut oil only (Patient states that she is not allergic to this)   Latex Other (See Comments)   Redness at site   Soy Allergy (obsolete) Hives   Tomato    Throat swelling, shortness of breath        Medication List     STOP taking these medications    amoxicillin 500 MG capsule Commonly known as: AMOXIL       TAKE these medications    acetaminophen 500 MG tablet Commonly known as: TYLENOL Take 500 mg by mouth every 4 (four) hours as needed for moderate pain (pain score 4-6) or fever.   Blood Pressure Monitor Misc For regular home bp monitoring during pregnancy   cyclobenzaprine 10 MG tablet Commonly known as: FLEXERIL Take 1 tablet (10 mg total) by mouth 2 (two) times daily as needed for muscle spasms.   Doxylamine-Pyridoxine 10-10 MG Tbec Commonly known as: Diclegis Take 2 qhs; may also take one in am and one in afternoon prn nausea   ondansetron 4 MG tablet Commonly known as: ZOFRAN Take 1 tablet (4 mg total) by mouth every 6 (six) hours. What changed: Another medication with the same name was added. Make sure you understand how and when to take each.   ondansetron 4 MG tablet Commonly known as: Zofran Take 2 tablets (8 mg total) by mouth 2 (two) times daily.  What changed: You were already taking a medication with the same name, and this prescription was added. Make sure you understand how and when to take each.   prenatal vitamin w/FE, FA 27-1 MG Tabs tablet Take 1 tablet by mouth daily at 12 noon.   sertraline 25 MG tablet Commonly known as: Zoloft Take 1 tablet (25 mg total) by mouth daily.        Marcell Barlow, MSN, [facility], [facility] for [employer]
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
## 2023-06-13 ENCOUNTER — Encounter: Payer: Self-pay | Admitting: Obstetrics & Gynecology

## 2023-06-13 ENCOUNTER — Ambulatory Visit (INDEPENDENT_AMBULATORY_CARE_PROVIDER_SITE_OTHER): Payer: Medicaid Other

## 2023-06-13 ENCOUNTER — Ambulatory Visit: Payer: Medicaid Other | Admitting: Obstetrics & Gynecology

## 2023-06-13 VITALS — BP 126/71 | HR 109 | Wt 206.0 lb

## 2023-06-13 DIAGNOSIS — R102 Pelvic and perineal pain: Secondary | ICD-10-CM

## 2023-06-13 DIAGNOSIS — Z3A20 20 weeks gestation of pregnancy: Secondary | ICD-10-CM | POA: Diagnosis not present

## 2023-06-13 DIAGNOSIS — O219 Vomiting of pregnancy, unspecified: Secondary | ICD-10-CM

## 2023-06-13 DIAGNOSIS — Z3482 Encounter for supervision of other normal pregnancy, second trimester: Secondary | ICD-10-CM | POA: Diagnosis not present

## 2023-06-13 DIAGNOSIS — R8271 Bacteriuria: Secondary | ICD-10-CM

## 2023-06-13 DIAGNOSIS — O26899 Other specified pregnancy related conditions, unspecified trimester: Secondary | ICD-10-CM

## 2023-06-13 DIAGNOSIS — O26892 Other specified pregnancy related conditions, second trimester: Secondary | ICD-10-CM

## 2023-06-13 DIAGNOSIS — Z3480 Encounter for supervision of other normal pregnancy, unspecified trimester: Secondary | ICD-10-CM

## 2023-06-13 DIAGNOSIS — Z363 Encounter for antenatal screening for malformations: Secondary | ICD-10-CM

## 2023-06-13 MED ORDER — CYCLOBENZAPRINE HCL 10 MG PO TABS: 10.0000 mg | ORAL_TABLET | Freq: Two times a day (BID) | ORAL | 0 refills | Status: DC | PRN

## 2023-06-13 MED ORDER — PROMETHAZINE HCL 25 MG PO TABS: 25.0000 mg | ORAL_TABLET | Freq: Four times a day (QID) | ORAL | 1 refills | Status: DC | PRN

## 2023-06-13 NOTE — Progress Notes (Signed)
 LOW-RISK PREGNANCY VISIT Patient name: Pamela Mccarty MRN [id number]  Date of birth: 10-13-1995 Chief Complaint:   Routine Prenatal Visit and Pregnancy Ultrasound (Nausea, vomiting x 3 day unable keep anything down. Today was able eat)  History of Present Illness:   Pamela Mccarty is a 28 y.o. [phone] female at [redacted]w[redacted]d with an Estimated Date of Delivery: 10/29/23 being seen today for ongoing management of a low-risk pregnancy.   1) Nausea vomiting in pregnancy -Taking Zofran sometimes up to 3 tablets at a time as well as Diclegis -She is still concerned about the amount of food she is able to keep down though it does seem to have slowly improved  -Requesting refill of Flexeril as it has helped with her pelvic pain and contractions She is aware to take this medication sparingly    05/15/2023   10:59 AM 12/26/2020    8:48 AM 09/18/2020    9:24 AM 08/29/2020    1:54 PM  Depression screen PHQ 2/9  Decreased Interest 0 0 0 0  Down, Depressed, Hopeless 0 0 0 0  PHQ - 2 Score 0 0 0 0  Altered sleeping 2 2 0 1  Tired, decreased energy 2 2 0 1  Change in appetite 2 2 0 0  Feeling bad or failure about yourself  0 0 0 0  Trouble concentrating 0 0 0 0  Moving slowly or fidgety/restless 0 0 0 0  Suicidal thoughts 0 0 0 0  PHQ-9 Score 6 6 0 2    Contractions: Not present.  .  Movement: Present. denies leaking of fluid. Review of Systems:   Pertinent items are noted in HPI Denies abnormal vaginal discharge w/ itching/odor/irritation, headaches, visual changes, shortness of breath, chest pain, abdominal pain, denies problems with urination or bowel movements unless otherwise stated above. Pertinent History Reviewed:  Reviewed past medical,surgical, social, obstetrical and family history.  Reviewed problem list, medications and allergies.  Physical Assessment:   Vitals:   06/13/23 1048  BP: 126/71  Pulse: (!) 109  Weight: 206 lb (93.4 kg)  Body mass index is 34.28 kg/m.         Physical Examination:   General appearance: Well appearing, and in no distress  Mental status: Alert, oriented to person, place, and time  Skin: Warm & dry  Respiratory: Normal respiratory effort, no distress  Abdomen: Soft, gravid, nontender  Pelvic: Cervical exam deferred         Extremities: Edema: None  Psych:  mood and affect appropriate  Fetal Status:     Movement: Present   cephalic,anterior placenta gr 0,normal ovaries,cx 3 cm,FHR 150 bpm,SVP of fluid 4.5 cm,EFW 341 g 42%,limited view of spine,no obvious abnormalities,please have pt come back for additional images   Chaperone: n/a    No results found for this or any previous visit (from the past 24 hours).   Assessment & Plan:  1) Low-risk pregnancy [id number] at [redacted]w[redacted]d with an Estimated Date of Delivery: 10/29/23   2) nausea and vomiting -Will add Phenergan to current regimen encouraged patient to try and take a little bit less Zofran -Encourage small meals throughout the day okay to take protein shake if needed  3) pelvic pain Refill of Flexeril given  4) OB care -Anatomy scan completed today no abnormalities noted though incomplete views of spine.  Will have patient return in 4 weeks for follow-up scan  5) GBS bacteruria 6) Anxiety- on zoloft   Meds: No orders of the defined types  were placed in this encounter.  Labs/procedures today: anatomy scan  Plan:  Continue routine obstetrical care  Next visit: prefers in person    Reviewed: Preterm labor symptoms and general obstetric precautions including but not limited to vaginal bleeding, contractions, leaking of fluid and fetal movement were reviewed in detail with the patient.  All questions were answered. Pt has home bp cuff. Check bp weekly, let us know if >140/90.   Follow-up: Return in about 4 weeks (around 07/11/2023) for LROB visit.  No orders of the defined types were placed in this encounter.   Myna Hidalgo, DO Attending Obstetrician & Gynecologist, [facility] for [employer], [facility]

## 2023-06-13 NOTE — Progress Notes (Addendum)
 [place] 20+2 wks,cephalic,anterior placenta gr 0,normal ovaries,cx 3 cm,FHR 150 bpm,SVP of fluid 4.5 cm,EFW 341 g 42%,limited view of spine,no obvious abnormalities,please have pt come back for additional images

## 2023-06-17 ENCOUNTER — Ambulatory Visit (INDEPENDENT_AMBULATORY_CARE_PROVIDER_SITE_OTHER): Payer: Self-pay | Admitting: Clinical

## 2023-06-17 DIAGNOSIS — F4310 Post-traumatic stress disorder, unspecified: Secondary | ICD-10-CM

## 2023-06-18 NOTE — Patient Instructions (Signed)
 [facility] [address] [place], [place] [address] [phone] (main office) [phone] [facility] office)  [email or web address]     BRAINSTORMING  Develop a Plan Goals: Provide a way to start conversation about your new life with a baby Assist parents in recognizing and using resources within their reach Help pave the way before birth for an easier period of transition afterwards.  Make a list of the following information to keep in a central location: Full name of Mom and Partner: _____________________________________________ Baby's full name and Date of Birth: ___________________________________________ Home Address: ___________________________________________________________ ________________________________________________________________________ Home Phone: ____________________________________________________________ Parents' cell numbers: _____________________________________________________ ________________________________________________________________________ Name and contact info for OB: ______________________________________________ Name and contact info for Pediatrician:________________________________________ Contact info for Lactation Consultants: ________________________________________  REST and SLEEP *You each need at least 4-5 hours of uninterrupted sleep every day. Write specific names and contact information.* How are you going to rest in the postpartum period? While partner's home? When partner returns to work? When you both return to work? Where will your baby sleep? Who is available to help during the day? Evening? Night? Who could move in for a period to help support you? What are some ideas to help you get enough  sleep? __________________________________________________________________________________________________________________________________________________________________________________________________________________________________________ NUTRITIOUS FOOD AND DRINK *Plan for meals before your baby is born so you can have healthy food to eat during the immediate postpartum period.* Who will look after breakfast? Lunch? Dinner? List names and contact information. Brainstorm quick, healthy ideas for each meal. What can you do before baby is born to prepare meals for the postpartum period? How can others help you with meals? Which grocery stores provide online shopping and delivery? Which restaurants offer take-out or delivery options? ______________________________________________________________________________________________________________________________________________________________________________________________________________________________________________________________________________________________________________________________________________________________________________________________________  CARE FOR MOM *It's important that mom is cared for and pampered in the postpartum period. Remember, the most important ways new mothers need care are: sleep, nutrition, gentle exercise, and time off.* Who can come take care of mom during this period? Make a list of people with their contact information. List some activities that make you feel cared for, rested, and energized? Who can make sure you have opportunities to do these things? Does mom have a space of her very own within your home that's just for her? Make a "[facility]" where she can be comfortable, rest, and renew herself  daily. ______________________________________________________________________________________________________________________________________________________________________________________________________________________________________________________________________________________________________________________________________________________________________________________________________    CARE FOR AND FEEDING BABY *Knowledgeable and encouraging people will offer the best support with regard to feeding your baby.* Educate yourself and choose the best feeding option for your baby. Make a list of people who will guide, support, and be a resource for you as your care for and feed your baby. (Friends that have breastfed or are currently breastfeeding, lactation consultants, breastfeeding support groups, etc.) Consider a postpartum doula. (These websites can give you information: dona.org & https://shea.org/) Seek out local breastfeeding resources like the breastfeeding support group at [employer] or [employer]. ______________________________________________________________________________________________________________________________________________________________________________________________________________________________________________________________________________________________________________________________________________________________________________________________________  Judson Roch AND ERRANDS Who can help with a thorough cleaning before baby is born? Make a list of people who will help with housekeeping and chores, like laundry, light cleaning, dishes, bathrooms, etc. Who can run some errands for you? What can you do to make sure you are stocked with basic supplies before baby is born? Who is going to do the  shopping? ______________________________________________________________________________________________________________________________________________________________________________________________________________________________________________________________________________________________________________________________________________________________________________________________________     Family Adjustment *Nurture yourselves.it helps parents be more loving and allows for better bonding with their child.* What sorts of things do you and  partner enjoy doing together? Which activities help you to connect and strengthen your relationship? Make a list of those things. Make a list of people whom you trust to care for your baby so you can have some time together as a couple. What types of things help partner feel connected to Mom? Make a list. What needs will partner have in order to bond with baby? Other children? Who will care for them when you go into labor and while you are in the hospital? Think about what the needs of your older children might be. Who can help you meet those needs? In what ways are you helping them prepare for bringing baby home? List some specific strategies you have for family adjustment. _______________________________________________________________________________________________________________________________________________________________________________________________________________________________________________________________________________________________________________________________________________  SUPPORT *Someone who can empathize with experiences normalizes your problems and makes them more bearable.* Make a list of other friends, neighbors, and/or co-workers you know with infants (and small children, if applicable) with whom you can connect. Make a list of local or online support groups, mom groups, etc. in which you can be  involved. ______________________________________________________________________________________________________________________________________________________________________________________________________________________________________________________________________________________________________________________________________________________________________________________________________  Childcare Plans Investigate and plan for childcare if mom is returning to work. Talk about mom's concerns about her transition back to work. Talk about partner's concerns regarding this transition.  Mental Health *Your mental health is one of the highest priorities for a pregnant or postpartum mom.* 1 in 5 women experience anxiety and/or depression from the time of conception through the first year after birth. Postpartum Mood Disorders are the #1 complication of pregnancy and childbirth and the suffering experienced by these mothers is not necessary! These illnesses are temporary and respond well to treatment, which often includes self-care, social support, talk therapy, and medication when needed. Women experiencing anxiety and depression often say things like: "I'm supposed to be happy.why do I feel so sad?", "Why can't I snap out of it?", "I'm having thoughts that scare me." There is no need to be embarrassed if you are feeling these symptoms: Overwhelmed, anxious, angry, sad, guilty, irritable, hopeless, exhausted but can't sleep You are NOT alone. You are NOT to blame. With help, you WILL be well. Where can I find help? Medical professionals such as your OB, midwife, gynecologist, family practitioner, primary care provider, pediatrician, or mental health providers; [facility] support groups: Feelings After Birth, Breastfeeding Support Group, Baby and Me Group, and Fit 4 Two exercise classes. You have permission to ask for help. It will confirm your feelings, validate your experiences,  share/learn coping strategies, and gain support and encouragement as you heal. You are important! BRAINSTORM Make a list of local resources, including resources for mom and for partner. Identify support groups. Identify people to call late at night - include names and contact info. Talk with partner about perinatal mood and anxiety disorders. Talk with your OB, midwife, and doula about baby blues and about perinatal mood and anxiety disorders. Talk with your pediatrician about perinatal mood and anxiety disorders.   Support & Sanity Savers   What do you really need?  Basics In preparing for a new baby, many expectant parents spend hours shopping for baby clothes, decorating the nursery, and deciding which car seat to buy. Yet most don't think much about what the reality of parenting a newborn will be like, and what they need to make it through that. So, here is the advice of experienced parents. We know you'll read this, and think "they're exaggerating, I don't really need that." Just trust [place] on these, OK? Plan for  all of this, and if it turns out you don't need it, come back and teach [place] how you did it!  Must-Haves (Once baby's survival needs are met, make sure you attend to your own survival needs!) Sleep An average newborn sleeps 16-18 hours per day, over 6-7 sleep periods, rarely more than three hours at a time. It is normal and healthy for a newborn to wake throughout the night... but really hard on parents!! Naps. Prioritize sleep above any responsibilities like: cleaning house, visiting friends, running errands, etc.  Sleep whenever baby sleeps. If you can't nap, at least have restful times when baby eats. The more rest you get, the more patient you will be, the more emotionally stable, and better at solving problems.  Food You may not have realized it would be difficult to eat when you have a newborn. Yet, when we talk to countless new parents, they say things like "it may be 2:00 pm  when I realize I haven't had breakfast yet." Or "every time we sit down to dinner, baby needs to eat, and my food gets cold, so I don't bother to eat it." Finger food. Before your baby is born, stock up with one months' worth of food that: 1) you can eat with one hand while holding a baby, 2) doesn't need to be prepped, 3) is good hot or cold, 4) doesn't spoil when left out for a few hours, and 5) you like to eat. Think about: nuts, dried fruit, Clif bars, pretzels, jerky, gogurt, baby carrots, apples, bananas, crackers, cheez-n-crackers, string cheese, hot pockets or frozen burritos to microwave, garden burgers and breakfast pastries to put in the toaster, yogurt drinks, etc. Restaurant Menus. Make lists of your favorite restaurants & menu items. When family/friends want to help, you can give specific information without much thought. They can either bring you the food or send gift cards for just the right meals. Freezer Meals.  Take some time to make a few meals to put in the freezer ahead of time.  Easy to freeze meals can be anything such as soup, lasagna, chicken pie, or spaghetti sauce. Set up a Meal Schedule.  Ask friends and family to sign up to bring you meals during the first few weeks of being home. (It can be passed around at baby showers!) You have no idea how helpful this will be until you are in the throes of parenting.  Machine[employer].it is a great website to check out. Emotional Support Know who to call when you're stressed out. Parenting a newborn is very challenging work. There are times when it totally overwhelms your normal coping abilities. EVERY NEW PARENT NEEDS TO HAVE A PLAN FOR WHO TO CALL WHEN THEY JUST CAN'T COPE ANY MORE. (And it has to be someone other than the baby's other parent!) Before your baby is born, come up with at least one person you can call for support - write their phone number down and post it on the refrigerator. Anxiety & Sadness. Baby blues are normal after  pregnancy; however, there are more severe types of anxiety & sadness which can occur and should not be ignored.  They are always treatable, but you have to take the first step by reaching out for help. [facility] offers a "Mom Talk" group which meets every Tuesday from 10 am - 11 am.  This group is for new moms who need support and connection after their babies are born.  Call [phone].  Really, Really Helpful (Plan  for them! Make sure these happen often!!) Physical Support with Taking Care of Yourselves Asking friends and family. Before your baby is born, set up a schedule of people who can come and visit and help out (or ask a friend to schedule for you). Any time someone says "let me know what I can do to help," sign them up for a day. When they get there, their job is not to take care of the baby (that's your job and your joy). Their job is to take care of you!  Postpartum doulas. If you don't have anyone you can call on for support, look into postpartum doulas:  professionals at helping parents with caring for baby, caring for themselves, getting breastfeeding started, and helping with household tasks. www.padanc.org is a helpful website for learning about doulas in our area. Peer Support / Parent Groups Why: One of the greatest ideas for new parents is to be around other new parents. Parent groups give you a chance to share and listen to others who are going through the same season of life, get a sense of what is normal infant development by watching several babies learn and grow, share your stories of triumph and struggles with empathetic ears, and forgive your own mistakes when you realize all parents are learning by trial and error. Where to find: There are many places you can meet other new parents throughout our community.  [facility] offers the following classes for new moms and their little ones:  Baby and Me (Birth to Crawling) and Breastfeeding Support Group. Go to  [email or web address] or call [phone] for more information. Time for your Relationship It's easy to get so caught up in meeting baby's immediate needs that it's hard to find time to connect with your partner, and meet the needs of your relationship. It's also easy to forget what "quality time with your partner" actually looks like. If you take your baby on a date, you'd be amazed how much of your couple time is spent feeding the baby, diapering the baby, admiring the baby, and talking about the baby. Dating: Try to take time for just the two of you. Babysitter tip: Sometimes when moms are breastfeeding a newborn, they find it hard to figure out how to schedule outings around baby's unpredictable feeding schedules. Have the babysitter come for a three hour period. When she comes over, if baby has just eaten, you can leave right away, and come back in two hours. If baby hasn't fed recently, you start the date at home. Once baby gets hungry and gets a good feeding in, you can head out for the rest of your date time. Date Nights at Home: If you can't get out, at least set aside one evening a week to prioritize your relationship: whenever baby dozes off or doesn't have any immediate needs, spend a little time focusing on each other. Potential conflicts: The main relationship conflicts that come up for new parents are: issues related to sexuality, financial stresses, a feeling of an unfair division of household tasks, and conflicts in parenting styles. The more you can work on these issues before baby arrives, the better!  Fun and Frills (Don't forget these. and don't feel guilty for indulging in them!) Everyone has something in life that is a fun little treat that they do just for themselves. It may be: reading the morning paper, or going for a daily jog, or having coffee with a friend once a week, or going to a movie on  Friday nights, or fine chocolates, or bubble baths, or curling up with a good  book. Unless you do fun things for yourself every now and then, it's hard to have the energy for fun with your baby. Whatever your "special" treats are, make sure you find a way to continue to indulge in them after your baby is born. These special moments can recharge you, and allow you to return to baby with a new joy   PERINATAL MOOD DISORDERS: MATERNAL MENTAL HEALTH FROM CONCEPTION THROUGH THE POSTPARTUM PERIOD   _________________________________________Emergency and Crisis Resources If you are an imminent risk to self or others, are experiencing intense personal distress, and/or have noticed significant changes in activities of daily living, call:  [facility]: [phone]  [address], [place], [place], [address] Mobile Crisis: [phone] National Suicide Hotline: 61 Or visit the following crisis centers: Local Emergency Departments [facility]:  [address], [place]  Mon-Friday 8am-3pm, [phone]                                                                                  ___________ Non-Crisis Resources To identify specific providers that are covered by your insurance, contact your insurance company or local agencies:   Postpartum Support International- Warm-line: 7738497160                                                      __Outpatient Therapy and Medication Management   Providers:  [facility]: [phone] Hours: 9AM-5PM  Insurance Accepted: Pilar Jarvis, BCBS, Crista Luria, [place], Medicare  [employer] [facility] Care): [phone] Hours: 8AM-5:30PM  nsurance Accepted: All insurances EXCEPT AARP, [place], [place], and [employer] of the [place]: [phone] Hours: 8AM-8PM Insurance Accepted: Ulla Gallo, Crista Luria, [place], Medicare, [phone] [facility]: [phone] Hours: 8:30AM-7PM Insurance Accepted: Ulla Gallo, Medicaid, Medicare, Tricare, [employer] [facility]:  [phone]   Hours:9AM-5PM Insurance Accepted:  Monia Pouch, Ezequiel Essex, [employer], [place], [employer] Care  [facility]: [phone] Hours: 9AM-5:30PM Insurance Accepted: Pilar Jarvis, Teodoro Spray, and Medicaid, Medicare, [facility]:  [phone] Hours: 9am-5pm Insurance Accepted: BCBS; they do not accept Medicaid/Medicare [facility]: [phone] Hours: 9am-9pm Insurance Accepted: All major insurance including Medicaid and Medicare [facility]: [phone] Hours: 9AM- 5PM Insurance Accepted: All insurances EXCEPT Medicaid and Medicare. [facility]: [phone]   ____________                                                                     Parenting Support Groups [facility] :  [address], [place], [place], [address] [phone] [facility]  Regional:  [phone] Family Support Network: (support for children in the NICU and/or with special needs), [phone]     _____________                                                                                  Online Resources Postpartum Support International: SeekAlumni.co.za  800-944-4PPD Supporting Moms:  www.momssupportingmoms.net

## 2023-06-30 ENCOUNTER — Ambulatory Visit: Admitting: Physician Assistant

## 2023-06-30 ENCOUNTER — Encounter: Payer: Self-pay | Admitting: Physician Assistant

## 2023-06-30 VITALS — BP 122/60 | HR 101 | Temp 98.1°F | Ht 65.0 in | Wt 208.0 lb

## 2023-06-30 DIAGNOSIS — Z7689 Persons encountering health services in other specified circumstances: Secondary | ICD-10-CM

## 2023-06-30 DIAGNOSIS — R002 Palpitations: Secondary | ICD-10-CM

## 2023-06-30 DIAGNOSIS — F332 Major depressive disorder, recurrent severe without psychotic features: Secondary | ICD-10-CM

## 2023-06-30 MED ORDER — SERTRALINE HCL 50 MG PO TABS
50.0000 mg | ORAL_TABLET | Freq: Every day | ORAL | 3 refills | Status: DC
Start: 2023-06-30 — End: 2023-10-11

## 2023-06-30 NOTE — Assessment & Plan Note (Addendum)
 Stable today, normal rate and rhythm today in office. No chest pain, shortness of breath, or dizziness today. EKG done in office showed sinus tachycardia but otherwise unremarkable. Will order lab work to rule out anemia or electrolyte disturbances. Pending normal lab work will consider cardiology referral for Holter monitor if palpitations persist. Warning signs reviewed. Patient should contact office for persistent or worsening palpations. I advised patient to discuss this concern with of OB/GYN as well.

## 2023-06-30 NOTE — Assessment & Plan Note (Signed)
 Patient reporting worsening depression. Denies SI or HI. Will increase Zoloft to 50mg  daily. Advised close follow up with his OB/GYN.

## 2023-06-30 NOTE — Progress Notes (Signed)
 New Patient Office Visit  Subjective    Patient ID: Pamela Mccarty, female    DOB: September 21, 1995  Age: 29 y.o. MRN: [id number]  CC:  Chief Complaint  Patient presents with   Establish Care    HPI Pamela Mccarty presents to establish care  Patient presents today to establish care with complaints of heart palpitations and worsening depression.  Patient is currently [redacted] weeks pregnant.  She reports over the weekend feeling like her heart rate was increased and overall being more aware of her heartbeat.  She states associated lightheadedness/dizziness.  She denies palpitations today.  She denies increased stress, anxiety or caffeine to contribute to increased heart rate.  Patient also voices concern for worsening depression.  She is currently on Zoloft 25 mg however is still feeling depressed.  She denies HI or SI today.  Outpatient Encounter Medications as of 06/30/2023  Medication Sig   acetaminophen (TYLENOL) 500 MG tablet Take 500 mg by mouth every 4 (four) hours as needed for moderate pain (pain score 4-6) or fever.   Blood Pressure Monitor MISC For regular home bp monitoring during pregnancy   cyclobenzaprine (FLEXERIL) 10 MG tablet Take 1 tablet (10 mg total) by mouth 2 (two) times daily as needed for muscle spasms.   Doxylamine-Pyridoxine (DICLEGIS) 10-10 MG TBEC Take 2 qhs; may also take one in am and one in afternoon prn nausea   ondansetron (ZOFRAN) 4 MG tablet Take 1 tablet (4 mg total) by mouth every 6 (six) hours.   ondansetron (ZOFRAN) 4 MG tablet Take 2 tablets (8 mg total) by mouth 2 (two) times daily.   prenatal vitamin w/FE, FA (PRENATAL 1 + 1) 27-1 MG TABS tablet Take 1 tablet by mouth daily at 12 noon.   promethazine (PHENERGAN) 25 MG tablet Take 1 tablet (25 mg total) by mouth every 6 (six) hours as needed for nausea or vomiting.   sertraline (ZOLOFT) 50 MG tablet Take 1 tablet (50 mg total) by mouth daily.   [DISCONTINUED] sertraline (ZOLOFT) 25 MG tablet Take 1  tablet (25 mg total) by mouth daily.   No facility-administered encounter medications on file as of 06/30/2023.    Past Medical History:  Diagnosis Date   ADHD (attention deficit hyperactivity disorder)    Anxiety    Asthma    inhaler as a child   Depression    Epilepsy (HCC)    Headache(784.0)    Obesity    Seizures (HCC)    Seizures (HCC) 02/02/2021   per pt; 2/2 post partum blood loss    Past Surgical History:  Procedure Laterality Date   ADENOIDECTOMY     ANKLE SURGERY     ANKLE SURGERY  at 28yo   Pt. reports to lengthen her tendons   TONSILLECTOMY      Family History  Problem Relation Age of Onset   Heart disease Maternal Grandfather    Heart attack Maternal Grandfather    Hypertension Father    Stroke Father    Seizures Father    Bipolar disorder Mother    Cancer Mother    Diabetes Mother    Hypertension Mother    Seizures Mother    Seizures Brother     Social History   Socioeconomic History   Marital status: Media planner    Spouse name: Enid Derry   Number of children: 1   Years of education: Not on file   Highest education level: Not on file  Occupational History   Occupation:  Full Time    Comment: [employer]  Tobacco Use   Smoking status: Former    Current packs/day: 0.25    Types: Cigarettes   Smokeless tobacco: Never  Vaping Use   Vaping status: Never Used  Substance and Sexual Activity   Alcohol use: No   Drug use: Not Currently    Types: Marijuana    Comment: last used october 2024   Sexual activity: Yes    Partners: Male    Birth control/protection: None  Other Topics Concern   Not on file  Social History Narrative   Not on file   Social Drivers of Health   Financial Resource Strain: Low Risk  (05/15/2023)   Overall Financial Resource Strain (CARDIA)    Difficulty of Paying Living Expenses: Not hard at all  Food Insecurity: No Food Insecurity (05/15/2023)   Hunger Vital Sign    Worried About Running Out of Food in the Last Year: Never  true    Ran Out of Food in the Last Year: Never true  Transportation Needs: No Transportation Needs (05/15/2023)   PRAPARE - Administrator, Civil Service (Medical): No    Lack of Transportation (Non-Medical): No  Physical Activity: Insufficiently Active (05/15/2023)   Exercise Vital Sign    Days of Exercise per Week: 2 days    Minutes of Exercise per Session: 30 min  Stress: No Stress Concern Present (05/15/2023)   [employer] of Occupational Health - Occupational Stress Questionnaire    Feeling of Stress : Only a little  Social Connections: Moderately Integrated (05/15/2023)   Social Connection and Isolation Panel [NHANES]    Frequency of Communication with Friends and Family: More than three times a week    Frequency of Social Gatherings with Friends and Family: Twice a week    Attends Religious Services: 1 to 4 times per year    Active Member of [employer] or Organizations: Not on file    Attends Banker Meetings: Never    Marital Status: Living with partner  Intimate Partner Violence: Not At Risk (05/15/2023)   Humiliation, Afraid, Rape, and Kick questionnaire    Fear of Current or Ex-Partner: No    Emotionally Abused: No    Physically Abused: No    Sexually Abused: No    Review of Systems  Constitutional:  Negative for fever and malaise/fatigue.  Respiratory:  Negative for shortness of breath.   Cardiovascular:  Positive for palpitations. Negative for chest pain.  Gastrointestinal:  Positive for nausea. Negative for vomiting.  Neurological:  Positive for dizziness. Negative for headaches.  Psychiatric/Behavioral:  The patient has insomnia.       Objective    BP 122/60   Pulse (!) 101   Temp 98.1 F (36.7 C)   Ht 5\' 5"  (1.651 m)   Wt 208 lb (94.3 kg)   LMP 01/07/2023   SpO2 98%   BMI 34.61 kg/m   Physical Exam Constitutional:      Appearance: Normal appearance.  HENT:     Head: Normocephalic.     Mouth/Throat:     Mouth: Mucous membranes  are moist.     Pharynx: Oropharynx is clear.  Eyes:     Extraocular Movements: Extraocular movements intact.     Conjunctiva/sclera: Conjunctivae normal.  Cardiovascular:     Rate and Rhythm: Normal rate and regular rhythm.     Heart sounds: Normal heart sounds. No murmur heard.    No gallop.  Pulmonary:  Effort: Pulmonary effort is normal.     Breath sounds: Normal breath sounds.  Musculoskeletal:     Right lower leg: No edema.     Left lower leg: No edema.  Skin:    General: Skin is warm and dry.  Neurological:     General: No focal deficit present.     Mental Status: She is alert and oriented to person, place, and time.  Psychiatric:        Mood and Affect: Mood normal.        Behavior: Behavior normal.       Assessment & Plan:  Encounter to establish care  Heart palpitations Assessment & Plan: Stable today, normal rate and rhythm today in office. No chest pain, shortness of breath, or dizziness today. EKG done in office showed sinus tachycardia but otherwise unremarkable. Will order lab work to rule out anemia or electrolyte disturbances. Pending normal lab work will consider cardiology referral for Holter monitor if palpitations persist. Warning signs reviewed. Patient should contact office for persistent or worsening palpations. I advised patient to discuss this concern with of OB/GYN as well.   Orders: -     EKG 12-Lead -     CMP14+EGFR -     CBC with Differential/Platelet -     Magnesium  MDD (major depressive disorder), recurrent severe, without psychosis (HCC) Assessment & Plan: Patient reporting worsening depression. Denies SI or HI. Will increase Zoloft to 50mg  daily. Advised close follow up with his OB/GYN.   Orders: -     Sertraline HCl; Take 1 tablet (50 mg total) by mouth daily.  Dispense: 30 tablet; Refill: 3    Return in about 6 months (around 12/31/2023).   Toni Amend Nazirah Tri, PA-C

## 2023-07-01 ENCOUNTER — Encounter: Payer: Self-pay | Admitting: Physician Assistant

## 2023-07-01 LAB — CBC WITH DIFFERENTIAL/PLATELET
Basophils Absolute: 0 10*3/uL (ref 0.0–0.2)
Basos: 0 %
EOS (ABSOLUTE): 0.1 10*3/uL (ref 0.0–0.4)
Eos: 1 %
Hematocrit: 38.9 % (ref 34.0–46.6)
Hemoglobin: 13.4 g/dL (ref 11.1–15.9)
Immature Grans (Abs): 0 10*3/uL (ref 0.0–0.1)
Immature Granulocytes: 1 %
Lymphocytes Absolute: 1.7 10*3/uL (ref 0.7–3.1)
Lymphs: 23 %
MCH: 31.1 pg (ref 26.6–33.0)
MCHC: 34.4 g/dL (ref 31.5–35.7)
MCV: 90 fL (ref 79–97)
Monocytes Absolute: 0.5 10*3/uL (ref 0.1–0.9)
Monocytes: 7 %
Neutrophils Absolute: 5.2 10*3/uL (ref 1.4–7.0)
Neutrophils: 68 %
Platelets: 234 10*3/uL (ref 150–450)
RBC: 4.31 x10E6/uL (ref 3.77–5.28)
RDW: 11.9 % (ref 11.7–15.4)
WBC: 7.6 10*3/uL (ref 3.4–10.8)

## 2023-07-01 LAB — CMP14+EGFR
ALT: 8 IU/L (ref 0–32)
AST: 8 IU/L (ref 0–40)
Albumin: 3.5 g/dL — ABNORMAL LOW (ref 4.0–5.0)
Alkaline Phosphatase: 59 IU/L (ref 44–121)
BUN/Creatinine Ratio: 15 (ref 9–23)
BUN: 8 mg/dL (ref 6–20)
Bilirubin Total: <0.2 mg/dL (ref 0.0–1.2)
CO2: 19 mmol/L — ABNORMAL LOW (ref 20–29)
Calcium: 8.6 mg/dL — ABNORMAL LOW (ref 8.7–10.2)
Chloride: 107 mmol/L — ABNORMAL HIGH (ref 96–106)
Creatinine, Ser: 0.54 mg/dL — ABNORMAL LOW (ref 0.57–1.00)
Globulin, Total: 2.3 g/dL (ref 1.5–4.5)
Glucose: 84 mg/dL (ref 70–99)
Potassium: 3.9 mmol/L (ref 3.5–5.2)
Sodium: 140 mmol/L (ref 134–144)
Total Protein: 5.8 g/dL — ABNORMAL LOW (ref 6.0–8.5)
eGFR: 131 mL/min/{1.73_m2} (ref >59)

## 2023-07-01 NOTE — BH Specialist Note (Signed)
 Pt did not arrive to video visit and did not answer the phone ; Unable to leave voicemail; left MyChart message for patient.

## 2023-07-11 ENCOUNTER — Ambulatory Visit (INDEPENDENT_AMBULATORY_CARE_PROVIDER_SITE_OTHER)

## 2023-07-11 ENCOUNTER — Encounter: Payer: Self-pay | Admitting: Obstetrics and Gynecology

## 2023-07-11 ENCOUNTER — Ambulatory Visit: Admitting: Obstetrics and Gynecology

## 2023-07-11 ENCOUNTER — Encounter: Payer: Self-pay | Admitting: Obstetrics & Gynecology

## 2023-07-11 VITALS — BP 126/79 | HR 83 | Wt 209.0 lb

## 2023-07-11 DIAGNOSIS — Z363 Encounter for antenatal screening for malformations: Secondary | ICD-10-CM | POA: Diagnosis not present

## 2023-07-11 DIAGNOSIS — Z3A24 24 weeks gestation of pregnancy: Secondary | ICD-10-CM | POA: Diagnosis not present

## 2023-07-11 DIAGNOSIS — Z3482 Encounter for supervision of other normal pregnancy, second trimester: Secondary | ICD-10-CM

## 2023-07-11 DIAGNOSIS — F418 Other specified anxiety disorders: Secondary | ICD-10-CM

## 2023-07-11 DIAGNOSIS — Z3480 Encounter for supervision of other normal pregnancy, unspecified trimester: Secondary | ICD-10-CM

## 2023-07-11 NOTE — Progress Notes (Signed)
 [place] 24+2 wks,cephalic,cx 3.7 cm,anterior placenta gr 0,normal ovaries,SVP of fluid 4.7 cm,anatomy of the spine complete,no obvious abnormalities,EFW 658 g 31%,FHR 161 bpm

## 2023-07-11 NOTE — Progress Notes (Signed)
   PRENATAL VISIT NOTE  Subjective:  Pamela Mccarty is a 28 y.o. [phone] at [redacted]w[redacted]d being seen today for ongoing prenatal care.  She is currently monitored for the following issues for this low-risk pregnancy and has Attention-deficit hyperactivity disorder, combined type; Oppositional defiant disorder; MDD (major depressive disorder), recurrent severe, without psychosis (HCC); Opioid use disorder, severe, dependence (HCC); Seizure disorder (HCC); Depression with anxiety; Encounter for supervision of normal pregnancy, antepartum; History of preterm delivery; GBS bacteriuria; and Heart palpitations on their problem list.  Patient reports  situational anxiety, reports takes zofran PRN, has questions about cbd per other provider  .  Contractions: Not present.  .  Movement: Present. Denies leaking of fluid.   The following portions of the patient's history were reviewed and updated as appropriate: allergies, current medications, past family history, past medical history, past social history, past surgical history and problem list.   Objective:   Vitals:   07/11/23 1130  BP: 126/79  Pulse: 83  Weight: 209 lb (94.8 kg)    Fetal Status:     Movement: Present     General:  Alert, oriented and cooperative. Patient is in no acute distress.  Skin: Skin is warm and dry. No rash noted.   Cardiovascular: Normal heart rate noted  Respiratory: Normal respiratory effort, no problems with respiration noted  Abdomen: Soft, gravid, appropriate for gestational age.  Pain/Pressure: Absent     Pelvic: Cervical exam deferred        Extremities: Normal range of motion.  Edema: Trace  Mental Status: Normal mood and affect. Normal behavior. Normal judgment and thought content.   Assessment and Plan:  Pregnancy: [id number] at [redacted]w[redacted]d 1. Supervision of other normal pregnancy, antepartum (Primary) BP and FHR normal Doing well, feeling regular movement    2. [redacted] weeks gestation of pregnancy U/s today  cephalic,cx 3.7 cm,anterior placenta gr 0,normal ovaries,SVP of fluid 4.7 cm,anatomy of the spine complete,no obvious abnormalities,EFW 658 g 31%,FHR 161 bpm   3. Depression with anxiety Discussed mechanism of zofran, discussed works better when taken on a daily basis. Discussed there are other options for more PRN use. Not a lot of info on cbd in pregnancy, discussed with other md in office   Preterm labor symptoms and general obstetric precautions including but not limited to vaginal bleeding, contractions, leaking of fluid and fetal movement were reviewed in detail with the patient. Please refer to After Visit Summary for other counseling recommendations.   Return in 4 weeks for LOB, PN2  Future Appointments  Date Time Provider Department Center  07/15/2023 10:45 AM [facility]INIC[facility]  08/07/2023  8:50 AM [facility]  08/07/2023  9:10 AM Cheral Marker, CNM [facility]  12/31/2023  8:40 AM Grooms, Toni Amend, PA-C [facility]    Albertine Grates, FNP

## 2023-07-13 ENCOUNTER — Encounter (HOSPITAL_COMMUNITY): Payer: Self-pay | Admitting: Emergency Medicine

## 2023-07-13 ENCOUNTER — Other Ambulatory Visit: Payer: Self-pay

## 2023-07-13 ENCOUNTER — Emergency Department (HOSPITAL_COMMUNITY)
Admission: EM | Admit: 2023-07-13 | Discharge: 2023-07-13 | Disposition: A | Discharge status: Home / Self Care | Attending: Emergency Medicine | Admitting: Emergency Medicine

## 2023-07-13 DIAGNOSIS — O99891 Other specified diseases and conditions complicating pregnancy: Secondary | ICD-10-CM | POA: Diagnosis not present

## 2023-07-13 DIAGNOSIS — O26892 Other specified pregnancy related conditions, second trimester: Secondary | ICD-10-CM | POA: Diagnosis not present

## 2023-07-13 DIAGNOSIS — H9201 Otalgia, right ear: Secondary | ICD-10-CM | POA: Insufficient documentation

## 2023-07-13 DIAGNOSIS — O99512 Diseases of the respiratory system complicating pregnancy, second trimester: Secondary | ICD-10-CM | POA: Insufficient documentation

## 2023-07-13 DIAGNOSIS — J45909 Unspecified asthma, uncomplicated: Secondary | ICD-10-CM | POA: Insufficient documentation

## 2023-07-13 DIAGNOSIS — Z87891 Personal history of nicotine dependence: Secondary | ICD-10-CM | POA: Insufficient documentation

## 2023-07-13 DIAGNOSIS — Z3A23 23 weeks gestation of pregnancy: Secondary | ICD-10-CM | POA: Diagnosis not present

## 2023-07-13 MED ORDER — AMOXICILLIN 250 MG PO CAPS
500.0000 mg | ORAL_CAPSULE | Freq: Once | ORAL | Status: AC
Start: 2023-07-13 — End: 2023-07-13
  Administered 2023-07-13: 500 mg via ORAL
  Filled 2023-07-13: qty 2

## 2023-07-13 MED ORDER — HYDROMORPHONE HCL 1 MG/ML IJ SOLN
1.0000 mg | Freq: Once | INTRAMUSCULAR | Status: AC
  Administered 2023-07-13: 1 mg via INTRAMUSCULAR
  Filled 2023-07-13: qty 1

## 2023-07-13 MED ORDER — AMOXICILLIN 500 MG PO CAPS: 500.0000 mg | ORAL_CAPSULE | Freq: Three times a day (TID) | ORAL | 0 refills | Status: AC

## 2023-07-13 MED ORDER — OFLOXACIN 0.3 % OT SOLN: 5.0000 [drp] | Freq: Two times a day (BID) | OTIC | 0 refills | Status: AC

## 2023-07-13 MED ORDER — OFLOXACIN 0.3 % OP SOLN
5.0000 [drp] | Freq: Every day | OPHTHALMIC | Status: DC
  Administered 2023-07-13: 5 [drp] via OTIC
  Filled 2023-07-13: qty 5

## 2023-07-13 MED ORDER — ACETAMINOPHEN 500 MG PO TABS
1000.0000 mg | ORAL_TABLET | Freq: Once | ORAL | Status: AC
  Administered 2023-07-13: 1000 mg via ORAL
  Filled 2023-07-13: qty 2

## 2023-07-13 NOTE — ED Provider Notes (Signed)
 [facility]EMERGENCY DEPT [facility] Emergency Department Provider Note MRN:  [id number]  Arrival date & time: 07/13/23     Chief Complaint   Otalgia   History of Present Illness   Pamela Mccarty is a 28 y.o. year-old female with a history of anxiety presenting to the ED with chief complaint of otalgia.  Right ear pain that started at about 1 PM today, getting progressively worse and is now severe.  Trouble hearing out of the right ear, saw some blood come out of the ear earlier when she wiped it.  Denies fever, no other complaints.  Has some teeth issues to the bottom lower right teeth as well.  Review of Systems  A thorough review of systems was obtained and all systems are negative except as noted in the HPI and PMH.   Patient's Health History    Past Medical History:  Diagnosis Date   ADHD (attention deficit hyperactivity disorder)    Anxiety    Asthma    inhaler as a child   Depression    Epilepsy (HCC)    Headache(784.0)    Obesity    Seizures (HCC)    Seizures (HCC) 02/02/2021   per pt; 2/2 post partum blood loss    Past Surgical History:  Procedure Laterality Date   ADENOIDECTOMY     ANKLE SURGERY     ANKLE SURGERY  at 28yo   Pt. reports to lengthen her tendons   TONSILLECTOMY      Family History  Problem Relation Age of Onset   Heart disease Maternal Grandfather    Heart attack Maternal Grandfather    Hypertension Father    Stroke Father    Seizures Father    Bipolar disorder Mother    Cancer Mother    Diabetes Mother    Hypertension Mother    Seizures Mother    Seizures Brother     Social History   Socioeconomic History   Marital status: Media planner    Spouse name: Enid Derry   Number of children: 1   Years of education: Not on file   Highest education level: Not on file  Occupational History   Occupation: Full Time    Comment: [employer]  Tobacco Use   Smoking status: Former    Current packs/day: 0.25    Types: Cigarettes   Smokeless  tobacco: Never  Vaping Use   Vaping status: Never Used  Substance and Sexual Activity   Alcohol use: No   Drug use: Not Currently    Types: Marijuana    Comment: last used october 2024   Sexual activity: Yes    Partners: Male    Birth control/protection: None  Other Topics Concern   Not on file  Social History Narrative   Not on file   Social Drivers of Health   Financial Resource Strain: Low Risk  (05/15/2023)   Overall Financial Resource Strain (CARDIA)    Difficulty of Paying Living Expenses: Not hard at all  Food Insecurity: No Food Insecurity (05/15/2023)   Hunger Vital Sign    Worried About Running Out of Food in the Last Year: Never true    Ran Out of Food in the Last Year: Never true  Transportation Needs: No Transportation Needs (05/15/2023)   PRAPARE - Administrator, Civil Service (Medical): No    Lack of Transportation (Non-Medical): No  Physical Activity: Insufficiently Active (05/15/2023)   Exercise Vital Sign    Days of Exercise per Week: 2 days  Minutes of Exercise per Session: 30 min  Stress: No Stress Concern Present (05/15/2023)   [employer] of Occupational Health - Occupational Stress Questionnaire    Feeling of Stress : Only a little  Social Connections: Moderately Integrated (05/15/2023)   Social Connection and Isolation Panel [NHANES]    Frequency of Communication with Friends and Family: More than three times a week    Frequency of Social Gatherings with Friends and Family: Twice a week    Attends Religious Services: 1 to 4 times per year    Active Member of [employer] or Organizations: Not on file    Attends Banker Meetings: Never    Marital Status: Living with partner  Intimate Partner Violence: Not At Risk (05/15/2023)   Humiliation, Afraid, Rape, and Kick questionnaire    Fear of Current or Ex-Partner: No    Emotionally Abused: No    Physically Abused: No    Sexually Abused: No     Physical Exam   Vitals:   07/13/23  0050  BP: 139/79  Pulse: 83  Resp: 18  Temp: 98.4 F (36.9 C)  SpO2: 97%    CONSTITUTIONAL: Well-appearing, NAD NEURO/PSYCH:  Alert and oriented x 3, no focal deficits EYES:  eyes equal and reactive ENT/NECK:  no LAD, no JVD CARDIO: Regular rate, well-perfused, normal S1 and S2 PULM:  CTAB no wheezing or rhonchi GI/GU:  non-distended, non-tender MSK/SPINE:  No gross deformities, no edema SKIN:  no rash, atraumatic   *Additional and/or pertinent findings included in MDM below  Diagnostic and Interventional Summary    EKG Interpretation Date/Time:    Ventricular Rate:    PR Interval:    QRS Duration:    QT Interval:    QTC Calculation:   R Axis:      Text Interpretation:         Labs Reviewed - No data to display  No orders to display    Medications  ofloxacin (OCUFLOX) 0.3 % ophthalmic solution 5 drop (has no administration in time range)  acetaminophen (TYLENOL) tablet 1,000 mg (1,000 mg Oral Given 07/13/23 0106)  amoxicillin (AMOXIL) capsule 500 mg (500 mg Oral Given 07/13/23 0106)  HYDROmorphone (DILAUDID) injection 1 mg (1 mg Intramuscular Given 07/13/23 0122)     Procedures  /  Critical Care Procedures  ED Course and Medical Decision Making  Initial Impression and Ddx Overall patient seems to be in a lot of pain, unclear if there is some embellishment or overdramatic sensation.  She has normal vital signs.  Looks like she has a perforated eardrum on the right, suspect otitis media with perforation.  She also is quite sensitive to movement of the pinna and even the slightest touching of the right ear, supporting the notion of traumatic disease shin but also considering otitis externa.  She has no abscess to the gingiva within the mouth, she does have some obvious cavities to the right lower teeth, could be contributing but favoring ear pathology at this time.  Past medical/surgical history that increases complexity of ED encounter: She is [redacted] weeks  pregnant  Interpretation of Diagnostics Laboratory and/or imaging options to aid in the diagnosis/care of the patient were considered.  After careful history and physical examination, it was determined that there was no indication for diagnostics at this time.  Patient Reassessment and Ultimate Disposition/Management     Patient continued to cry out in pain, we discussed the pros and cons of a single dose of a stronger pain medicine  and patient accepts the small risks of a single dose of opioid in the setting of this pain that seems to be causing her a lot of distress.  Highly doubt emergent process, patient is appropriate for discharge.  Patient management required discussion with the following services or consulting groups:  None  Complexity of Problems Addressed Acute illness or injury that poses threat of life of bodily function  Additional Data Reviewed and Analyzed Further history obtained from: Further history from spouse/family member  Additional Factors Impacting ED Encounter Risk Prescriptions  Elmer Sow. Pilar Plate, MD [facility] Emergency Medicine [facility] [email or web address]  Final Clinical Impressions(s) / ED Diagnoses     ICD-10-CM   1. Right ear pain  H92.01       ED Discharge Orders          Ordered    amoxicillin (AMOXIL) 500 MG capsule  3 times daily        07/13/23 0122    ofloxacin (FLOXIN) 0.3 % OTIC solution  2 times daily        07/13/23 0122             Discharge Instructions Discussed with and Provided to Patient:    Discharge Instructions      You were evaluated in the Emergency Department and after careful evaluation, we did not find any emergent condition requiring admission or further testing in the hospital.  Your exam/testing today is overall reassuring.  Symptoms may be due to an ear infection.  Take the amoxicillin antibiotic pills as well as the ofloxacin antibiotic drops as directed for the next week.  Use  Tylenol 1000 mg every 4-6 hours for pain.  Please return to the Emergency Department if you experience any worsening of your condition.   Thank you for allowing [place] to be a part of your care.      Sabas Sous, MD 07/13/23 [phone]

## 2023-07-13 NOTE — ED Triage Notes (Signed)
 Pt with c/o severe Rt sided ear pain. Pt also c/o dizziness and required wheelchair as well as lift assistance to get to room and into bed. Pt recently told she has some teeth that need to be removed and that her dentist told her she had a "blood infection".

## 2023-07-13 NOTE — ED Notes (Signed)
 ED Provider at bedside.

## 2023-07-13 NOTE — ED Notes (Signed)
 AC to bring Ocuflox from main pharmacy

## 2023-07-13 NOTE — Discharge Instructions (Signed)
 You were evaluated in the Emergency Department and after careful evaluation, we did not find any emergent condition requiring admission or further testing in the hospital.  Your exam/testing today is overall reassuring.  Symptoms may be due to an ear infection.  Take the amoxicillin antibiotic pills as well as the ofloxacin antibiotic drops as directed for the next week.  Use Tylenol 1000 mg every 4-6 hours for pain.  Please return to the Emergency Department if you experience any worsening of your condition.   Thank you for allowing [place] to be a part of your care.

## 2023-07-15 ENCOUNTER — Ambulatory Visit: Payer: Self-pay

## 2023-07-15 DIAGNOSIS — Z91199 Patient's noncompliance with other medical treatment and regimen due to unspecified reason: Secondary | ICD-10-CM

## 2023-07-16 ENCOUNTER — Encounter: Payer: Self-pay | Admitting: Women's Health

## 2023-07-30 ENCOUNTER — Encounter: Payer: Self-pay | Admitting: Women's Health

## 2023-08-07 ENCOUNTER — Other Ambulatory Visit

## 2023-08-07 ENCOUNTER — Encounter: Payer: Self-pay | Admitting: Women's Health

## 2023-08-07 ENCOUNTER — Ambulatory Visit (INDEPENDENT_AMBULATORY_CARE_PROVIDER_SITE_OTHER): Admitting: Women's Health

## 2023-08-07 VITALS — BP 118/77 | HR 91 | Wt 213.4 lb

## 2023-08-07 DIAGNOSIS — Z3483 Encounter for supervision of other normal pregnancy, third trimester: Secondary | ICD-10-CM | POA: Diagnosis not present

## 2023-08-07 DIAGNOSIS — Z3A28 28 weeks gestation of pregnancy: Secondary | ICD-10-CM

## 2023-08-07 DIAGNOSIS — Z131 Encounter for screening for diabetes mellitus: Secondary | ICD-10-CM

## 2023-08-07 DIAGNOSIS — Z1332 Encounter for screening for maternal depression: Secondary | ICD-10-CM

## 2023-08-07 DIAGNOSIS — Z3480 Encounter for supervision of other normal pregnancy, unspecified trimester: Secondary | ICD-10-CM

## 2023-08-07 NOTE — Progress Notes (Signed)
 LOW-RISK PREGNANCY VISIT Patient name: Pamela Mccarty MRN [id number]  Date of birth: 09-Jan-1996 Chief Complaint:   Routine Prenatal Visit (Lower abdominal pain, )  History of Present Illness:   Pamela Mccarty is a 28 y.o. [phone] female at [redacted]w[redacted]d with an Estimated Date of Delivery: 10/29/23 being seen today for ongoing management of a low-risk pregnancy.   Today she reports round ligament pain. Contractions: Not present. Vag. Bleeding: None.  Movement: Present. denies leaking of fluid.     08/07/2023    9:54 AM 06/30/2023    8:37 AM 05/15/2023   10:59 AM 12/26/2020    8:48 AM 09/18/2020    9:24 AM  Depression screen PHQ 2/9  Decreased Interest 0 0 0 0 0  Down, Depressed, Hopeless 0 1 0 0 0  PHQ - 2 Score 0 1 0 0 0  Altered sleeping 0 3 2 2  0  Tired, decreased energy 0 3 2 2  0  Change in appetite 0 0 2 2 0  Feeling bad or failure about yourself  0 0 0 0 0  Trouble concentrating 0 0 0 0 0  Moving slowly or fidgety/restless 0 0 0 0 0  Suicidal thoughts 0 0 0 0 0  PHQ-9 Score 0 7 6 6  0  Difficult doing work/chores  Not difficult at all           08/07/2023    9:54 AM 06/30/2023    8:38 AM 05/15/2023   10:59 AM 12/26/2020    8:49 AM  GAD 7 : Generalized Anxiety Score  Nervous, Anxious, on Edge 0 0 0 0  Control/stop worrying 0 0 0 0  Worry too much - different things 0 0 0 0  Trouble relaxing 0 0 0 0  Restless 0 0 0 0  Easily annoyed or irritable 0 0 1 0  Afraid - awful might happen 0 0 0 0  Total GAD 7 Score 0 0 1 0  Anxiety Difficulty  Not difficult at all        Review of Systems:   Pertinent items are noted in HPI Denies abnormal vaginal discharge w/ itching/odor/irritation, headaches, visual changes, shortness of breath, chest pain, abdominal pain, severe nausea/vomiting, or problems with urination or bowel movements unless otherwise stated above. Pertinent History Reviewed:  Reviewed past medical,surgical, social, obstetrical and family history.  Reviewed  problem list, medications and allergies. Physical Assessment:   Vitals:   08/07/23 0919  BP: 118/77  Pulse: 91  Weight: 213 lb 6.4 oz (96.8 kg)  Body mass index is 35.51 kg/m.        Physical Examination:   General appearance: Well appearing, and in no distress  Mental status: Alert, oriented to person, place, and time  Skin: Warm & dry  Cardiovascular: Normal heart rate noted  Respiratory: Normal respiratory effort, no distress  Abdomen: Soft, gravid, nontender  Pelvic: Cervical exam deferred         Extremities:    Fetal Status: Fetal Heart Rate (bpm): 150 Fundal Height: 29 cm Movement: Present    Chaperone: N/A No results found for this or any previous visit (from the past 24 hours).  Assessment & Plan:  1) Low-risk pregnancy [id number] at 107w1d with an Estimated Date of Delivery: 10/29/23   2) Dep/anx, doing well on zoloft , [facility]  3) H/O seizures> none in years, no meds  4) H/O PPROM w/ 31wk delivery> declined prometrium  earlier in pregnancy, reviewed ptl s/s, reasons to seek care  5) Round ligament pain> discussed   Meds: No orders of the defined types were placed in this encounter.  Labs/procedures today: PN2 and declines tdap today, maybe next visit  Plan:  Continue routine obstetrical care  Next visit: prefers in person    Reviewed: Preterm labor symptoms and general obstetric precautions including but not limited to vaginal bleeding, contractions, leaking of fluid and fetal movement were reviewed in detail with the patient.  All questions were answered. Does have home bp cuff. Office bp cuff given: not applicable. Check bp weekly, let us  know if consistently >140 and/or >90.  Follow-up: Return in about 2 weeks (around 08/21/2023) for LROB, CNM, in person.  Future Appointments  Date Time Provider Department Center  08/21/2023  8:50 AM Ferd Householder, CNM [facility]  12/31/2023  8:40 AM Grooms, Jearlean Mince, PA-C [facility]    No orders of the defined types  were placed in this encounter.  PRYSCILLA AMANN CNM, [facility] 08/07/2023 10:40 AM

## 2023-08-07 NOTE — Patient Instructions (Signed)
Ryelynn, thank you for choosing our office today! We appreciate the opportunity to meet your healthcare needs. You may receive a short survey by mail, e-mail, or through MyChart. If you are happy with your care we would appreciate if you could take just a few minutes to complete the survey questions. We read all of your comments and take your feedback very seriously. Thank you again for choosing our office.  [facility] ([address] [place], [place] [address]) Entrance C, located off of [address] Free 24/7 valet parking   CLASSES: Go to [email or web address] to register for classes (childbirth, breastfeeding, waterbirth, infant CPR, daddy bootcamp, etc.)  Call the office ([phone]) or go to [facility] if: You begin to have strong, frequent contractions Your water breaks.  Sometimes it is a big gush of fluid, sometimes it is just a trickle that keeps getting your panties wet or running down your legs You have vaginal bleeding.  It is normal to have a small amount of spotting if your cervix was checked.  You don't feel your baby moving like normal.  If you don't, get you something to eat and drink and lay down and focus on feeling your baby move.   If your baby is still not moving like normal, you should call the office or go to [facility].  Call the office ([phone]) or go to [facility] for these signs of pre-eclampsia: Severe headache that does not go away with Tylenol Visual changes- seeing spots, double, blurred vision Pain under your right breast or upper abdomen that does not go away with Tums or heartburn medicine Nausea and/or vomiting Severe swelling in your hands, feet, and face   Tdap Vaccine It is recommended that you get the Tdap vaccine during the third trimester of EACH pregnancy to help protect your baby from getting pertussis (whooping cough) 27-36 weeks is the BEST time to do  this so that you can pass the protection on to your baby. During pregnancy is better than after pregnancy, but if you are unable to get it during pregnancy it will be offered at the hospital.  You can get this vaccine with us, at the health department, your family doctor, or some local pharmacies Everyone who will be around your baby should also be up-to-date on their vaccines before the baby comes. Adults (who are not pregnant) only need 1 dose of Tdap during adulthood.   [place] Pediatricians/Family Doctors [facility] ([facility]): [address], [phone]           [facility]: [address], [phone]                 [facility] ([facility]): [address], [phone] (call to ask if accepting patients) [facility]: [address], [place], [phone]    [place] Pediatricians/Family Doctors [facility] ([facility]): [address], [phone] [facility]: [address], [phone] [facility]: [address], [phone]  [place] [facility]  [facility] ([facility]): [phone] [facility]: [address], [phone]   [place] [facility]: [address], [phone]  [place] [facility]  [facility]: [address], [phone]  Home Blood Pressure Monitoring for Patients   Your provider has recommended that you check your   blood pressure (BP) at least once a week at home. If you do not have a blood pressure cuff at home, one will be provided for you. Contact your provider if you have not received your monitor within 1 week.   Helpful Tips for Accurate Home Blood Pressure Checks  Don't smoke, exercise, or drink caffeine 30 minutes before checking your BP Use the restroom before checking your BP (a full bladder can raise your  pressure) Relax in a comfortable upright chair Feet on the ground Left arm resting comfortably on a flat surface at the level of your heart Legs uncrossed Back supported Sit quietly and don't talk Place the cuff on your bare arm Adjust snuggly, so that only two fingertips can fit between your skin and the top of the cuff Check 2 readings separated by at least one minute Keep a log of your BP readings For a visual, please reference this diagram: http://ccnc.care/bpdiagram  Provider Name: [facility]     Phone: [phone]  Zone 1: ALL CLEAR  Continue to monitor your symptoms:  BP reading is less than 140 (top number) or less than 90 (bottom number)  No right upper stomach pain No headaches or seeing spots No feeling nauseated or throwing up No swelling in face and hands  Zone 2: CAUTION Call your doctor's office for any of the following:  BP reading is greater than 140 (top number) or greater than 90 (bottom number)  Stomach pain under your ribs in the middle or right side Headaches or seeing spots Feeling nauseated or throwing up Swelling in face and hands  Zone 3: EMERGENCY  Seek immediate medical care if you have any of the following:  BP reading is greater than160 (top number) or greater than 110 (bottom number) Severe headaches not improving with Tylenol Serious difficulty catching your breath Any worsening symptoms from Zone 2   Third Trimester of Pregnancy The third trimester is from week 29 through week 42, months 7 through 9. The third trimester is a time when the fetus is growing rapidly. At the end of the ninth month, the fetus is about 20 inches in length and weighs 6-10 pounds.  BODY CHANGES Your body goes through many changes during pregnancy. The changes vary from woman to woman.  Your weight will continue to increase. You can expect to gain 25-35 pounds (11-16 kg) by the end of the pregnancy. You may begin to get stretch marks on your hips, abdomen,  and breasts. You may urinate more often because the fetus is moving lower into your pelvis and pressing on your bladder. You may develop or continue to have heartburn as a result of your pregnancy. You may develop constipation because certain hormones are causing the muscles that push waste through your intestines to slow down. You may develop hemorrhoids or swollen, bulging veins (varicose veins). You may have pelvic pain because of the weight gain and pregnancy hormones relaxing your joints between the bones in your pelvis. Backaches may result from overexertion of the muscles supporting your posture. You may have changes in your hair. These can include thickening of your hair, rapid growth, and changes in texture. Some women also have hair loss during or after pregnancy, or hair that feels dry or thin. Your hair will most likely return to normal after your baby is born. Your breasts will continue to grow and be tender. A yellow discharge may leak from your breasts called colostrum. Your belly button may stick out. You may   feel short of breath because of your expanding uterus. You may notice the fetus "dropping," or moving lower in your abdomen. You may have a bloody mucus discharge. This usually occurs a few days to a week before labor begins. Your cervix becomes thin and soft (effaced) near your due date. WHAT TO EXPECT AT YOUR PRENATAL EXAMS  You will have prenatal exams every 2 weeks until week 36. Then, you will have weekly prenatal exams. During a routine prenatal visit: You will be weighed to make sure you and the fetus are growing normally. Your blood pressure is taken. Your abdomen will be measured to track your baby's growth. The fetal heartbeat will be listened to. Any test results from the previous visit will be discussed. You may have a cervical check near your due date to see if you have effaced. At around 36 weeks, your caregiver will check your cervix. At the same time, your  caregiver will also perform a test on the secretions of the vaginal tissue. This test is to determine if a type of bacteria, Group B streptococcus, is present. Your caregiver will explain this further. Your caregiver may ask you: What your birth plan is. How you are feeling. If you are feeling the baby move. If you have had any abnormal symptoms, such as leaking fluid, bleeding, severe headaches, or abdominal cramping. If you have any questions. Other tests or screenings that may be performed during your third trimester include: Blood tests that check for low iron levels (anemia). Fetal testing to check the health, activity level, and growth of the fetus. Testing is done if you have certain medical conditions or if there are problems during the pregnancy. FALSE LABOR You may feel small, irregular contractions that eventually go away. These are called Braxton Hicks contractions, or false labor. Contractions may last for hours, days, or even weeks before true labor sets in. If contractions come at regular intervals, intensify, or become painful, it is best to be seen by your caregiver.  SIGNS OF LABOR  Menstrual-like cramps. Contractions that are 5 minutes apart or less. Contractions that start on the top of the uterus and spread down to the lower abdomen and back. A sense of increased pelvic pressure or back pain. A watery or bloody mucus discharge that comes from the vagina. If you have any of these signs before the 37th week of pregnancy, call your caregiver right away. You need to go to the hospital to get checked immediately. HOME CARE INSTRUCTIONS  Avoid all smoking, herbs, alcohol, and unprescribed drugs. These chemicals affect the formation and growth of the baby. Follow your caregiver's instructions regarding medicine use. There are medicines that are either safe or unsafe to take during pregnancy. Exercise only as directed by your caregiver. Experiencing uterine cramps is a good sign to  stop exercising. Continue to eat regular, healthy meals. Wear a good support bra for breast tenderness. Do not use hot tubs, steam rooms, or saunas. Wear your seat belt at all times when driving. Avoid raw meat, uncooked cheese, cat litter boxes, and soil used by cats. These carry germs that can cause birth defects in the baby. Take your prenatal vitamins. Try taking a stool softener (if your caregiver approves) if you develop constipation. Eat more high-fiber foods, such as fresh vegetables or fruit and whole grains. Drink plenty of fluids to keep your urine clear or pale yellow. Take warm sitz baths to soothe any pain or discomfort caused by hemorrhoids. Use hemorrhoid cream if   your caregiver approves. If you develop varicose veins, wear support hose. Elevate your feet for 15 minutes, 3-4 times a day. Limit salt in your diet. Avoid heavy lifting, wear low heal shoes, and practice good posture. Rest a lot with your legs elevated if you have leg cramps or low back pain. Visit your dentist if you have not gone during your pregnancy. Use a soft toothbrush to brush your teeth and be gentle when you floss. A sexual relationship may be continued unless your caregiver directs you otherwise. Do not travel far distances unless it is absolutely necessary and only with the approval of your caregiver. Take prenatal classes to understand, practice, and ask questions about the labor and delivery. Make a trial run to the hospital. Pack your hospital bag. Prepare the baby's nursery. Continue to go to all your prenatal visits as directed by your caregiver. SEEK MEDICAL CARE IF: You are unsure if you are in labor or if your water has broken. You have dizziness. You have mild pelvic cramps, pelvic pressure, or nagging pain in your abdominal area. You have persistent nausea, vomiting, or diarrhea. You have a bad smelling vaginal discharge. You have pain with urination. SEEK IMMEDIATE MEDICAL CARE IF:  You  have a fever. You are leaking fluid from your vagina. You have spotting or bleeding from your vagina. You have severe abdominal cramping or pain. You have rapid weight loss or gain. You have shortness of breath with chest pain. You notice sudden or extreme swelling of your face, hands, ankles, feet, or legs. You have not felt your baby move in over an hour. You have severe headaches that do not go away with medicine. You have vision changes. Document Released: 03/19/2001 Document Revised: 03/30/2013 Document Reviewed: 05/26/2012 Exit[employer] Patient Information 2015 [employer]. This information is not intended to replace advice given to you by your health care provider. Make sure you discuss any questions you have with your health care provider.       

## 2023-08-08 LAB — CBC
Hematocrit: 40.4 % (ref 34.0–46.6)
Hemoglobin: 13.6 g/dL (ref 11.1–15.9)
MCH: 30.2 pg (ref 26.6–33.0)
MCHC: 33.7 g/dL (ref 31.5–35.7)
MCV: 90 fL (ref 79–97)
Platelets: 214 10*3/uL (ref 150–450)
RBC: 4.5 x10E6/uL (ref 3.77–5.28)
RDW: 12 % (ref 11.7–15.4)
WBC: 8.1 10*3/uL (ref 3.4–10.8)

## 2023-08-08 LAB — GLUCOSE TOLERANCE, 2 HOURS W/ 1HR
Glucose, 1 hour: 126 mg/dL (ref 70–179)
Glucose, 2 hour: 113 mg/dL (ref 70–152)
Glucose, Fasting: 76 mg/dL (ref 70–91)

## 2023-08-08 LAB — RPR: RPR Ser Ql: NONREACTIVE

## 2023-08-08 LAB — ANTIBODY SCREEN: Antibody Screen: NEGATIVE

## 2023-08-08 LAB — HIV ANTIBODY (ROUTINE TESTING W REFLEX): HIV Screen 4th Generation wRfx: NONREACTIVE

## 2023-08-10 ENCOUNTER — Encounter: Payer: Self-pay | Admitting: Obstetrics & Gynecology

## 2023-08-11 ENCOUNTER — Encounter: Payer: Self-pay | Admitting: Women's Health

## 2023-08-21 ENCOUNTER — Encounter: Payer: Self-pay | Admitting: Women's Health

## 2023-08-21 ENCOUNTER — Ambulatory Visit (INDEPENDENT_AMBULATORY_CARE_PROVIDER_SITE_OTHER): Admitting: Women's Health

## 2023-08-21 VITALS — BP 113/73 | HR 94 | Wt 217.0 lb

## 2023-08-21 DIAGNOSIS — Z331 Pregnant state, incidental: Secondary | ICD-10-CM

## 2023-08-21 DIAGNOSIS — Z23 Encounter for immunization: Secondary | ICD-10-CM | POA: Diagnosis not present

## 2023-08-21 DIAGNOSIS — Z3A30 30 weeks gestation of pregnancy: Secondary | ICD-10-CM

## 2023-08-21 DIAGNOSIS — Z3483 Encounter for supervision of other normal pregnancy, third trimester: Secondary | ICD-10-CM

## 2023-08-21 DIAGNOSIS — Z3480 Encounter for supervision of other normal pregnancy, unspecified trimester: Secondary | ICD-10-CM

## 2023-08-21 DIAGNOSIS — R350 Frequency of micturition: Secondary | ICD-10-CM | POA: Diagnosis not present

## 2023-08-21 DIAGNOSIS — Z1389 Encounter for screening for other disorder: Secondary | ICD-10-CM | POA: Diagnosis not present

## 2023-08-21 LAB — POCT URINALYSIS DIPSTICK OB
Blood, UA: NEGATIVE
Glucose, UA: NEGATIVE
Ketones, UA: NEGATIVE
Leukocytes, UA: NEGATIVE
Nitrite, UA: NEGATIVE

## 2023-08-21 MED ORDER — NITROFURANTOIN MONOHYD MACRO 100 MG PO CAPS: 100.0000 mg | ORAL_CAPSULE | Freq: Two times a day (BID) | ORAL | 0 refills | Status: DC

## 2023-08-21 NOTE — Patient Instructions (Addendum)
 Pamela Mccarty, thank you for choosing our office today! We appreciate the opportunity to meet your healthcare needs. You may receive a short survey by mail, e-mail, or through [employer]. If you are happy with your care we would appreciate if you could take just a few minutes to complete the survey questions. We read all of your comments and take your feedback very seriously. Thank you again for choosing our office.  Center for [employer] Healthcare Team at [facility]  [facility] at [facility] ([address] [place], [place] [address]) Entrance C, located off of [address] 24/7 valet parking   Tips to Help You Sleep Better:  Get into a bedtime routine, try to do the same thing every night before going to bed to try to help your body wind down Warm baths Avoid caffeine for at least 3 hours before going to sleep  Keep your room at a slightly cooler temperature, can try running a fan Turn off TV, lights, phone, electronics Lots of pillows if needed to help you get comfortable Lavender scented items can help you sleep. You can place lavender essential oil on a cotton ball and place under your pillowcase, or place in a diffuser. [name] has a lavender scented sleep line (plug-ins, sprays, etc). Look in the pillow aisle for lavender scented pillows.  If none of the above things help, you can try 1/2 to 1 tablet of benadryl , unisom , or tylenol  pm. Do not take this every night, only when you really need it.    CLASSES: Go to [email or web address] to register for classes (childbirth, breastfeeding, waterbirth, infant CPR, daddy bootcamp, etc.)  Call the office [phone]) or go to [facility] if: You begin to have strong, frequent contractions Your water breaks.  Sometimes it is a big gush of fluid, sometimes it is just a trickle that keeps getting your panties wet or running down your legs You have vaginal bleeding.  It is normal to have a small amount of spotting if your cervix was  checked.  You don't feel your baby moving like normal.  If you don't, get you something to eat and drink and lay down and focus on feeling your baby move.   If your baby is still not moving like normal, you should call the office or go to [facility].  Call the office [phone]) or go to [facility] hospital for these signs of pre-eclampsia: Severe headache that does not go away with Tylenol  Visual changes- seeing spots, double, blurred vision Pain under your right breast or upper abdomen that does not go away with Tums or heartburn medicine Nausea and/or vomiting Severe swelling in your hands, feet, and face   Tdap Vaccine It is recommended that you get the Tdap vaccine during the third trimester of EACH pregnancy to help protect your baby from getting pertussis (whooping cough) 27-36 weeks is the BEST time to do this so that you can pass the protection on to your baby. During pregnancy is better than after pregnancy, but if you are unable to get it during pregnancy it will be offered at the hospital.  You can get this vaccine with us , at the health department, your family doctor, or some local pharmacies Everyone who will be around your baby should also be up-to-date on their vaccines before the baby comes. Adults (who are not pregnant) only need 1 dose of Tdap during adulthood.   [facility] Pediatricians/Family Doctors [facility]): [address], [phone]  [facility]: [address], [phone]                [facility]): [address], [phone] (call to ask if accepting patients) [facility]: [address], [place], [phone]    [facility] Pediatricians/Family Doctors [facility]): [phone], [phone] [facility]: [address] [place], [phone] [facility] [place]: [address],  [phone]  [facility] Doctors  [facility]): [phone] [facility]: [address], [phone]   [facility] Doctors [facility]: [address], [phone]  [facility] Doctors  [facility]: [phone], [phone]  Home Blood Pressure Monitoring for Patients   Your provider has recommended that you check your blood pressure (BP) at least once a week at home. If you do not have a blood pressure cuff at home, one will be provided for you. Contact your provider if you have not received your monitor within 1 week.   Helpful Tips for Accurate Home Blood Pressure Checks  Don't smoke, exercise, or drink caffeine 30 minutes before checking your BP Use the restroom before checking your BP (a full bladder can raise your pressure) Relax in a comfortable upright chair Feet on the ground Left arm resting comfortably on a flat surface at the level of your heart Legs uncrossed Back supported Sit quietly and don't talk Place the cuff on your bare arm Adjust snuggly, so that only two fingertips can fit between your skin and the top of the cuff Check 2 readings separated by at least one minute Keep a log of your BP readings For a visual, please reference this diagram: http://ccnc.care/bpdiagram  Provider Name: [facility]     Phone: [phone]  Zone 1: ALL CLEAR  Continue to monitor your symptoms:  BP reading is less than 140 (top number) or less than 90 (bottom number)  No right upper stomach pain No headaches or seeing spots No feeling nauseated or throwing up No swelling in face and hands  Zone 2: CAUTION Call your doctor's office for any of the following:  BP reading is greater than 140 (top number) or greater than 90 (bottom number)  Stomach pain under your ribs in the middle or right side Headaches or seeing spots Feeling nauseated or throwing up Swelling in face  and hands  Zone 3: EMERGENCY  Seek immediate medical care if you have any of the following:  BP reading is greater than160 (top number) or greater than 110 (bottom number) Severe headaches not improving with Tylenol  Serious difficulty catching your breath Any worsening symptoms from Zone 2  Preterm Labor and Birth Information  The normal length of a pregnancy is 39-41 weeks. Preterm labor is when labor starts before 37 completed weeks of pregnancy. What are the risk factors for preterm labor? Preterm labor is more likely to occur in women who: Have certain infections during pregnancy such as a bladder infection, sexually transmitted infection, or infection inside the uterus (chorioamnionitis). Have a shorter-than-normal cervix. Have gone into preterm labor before. Have had surgery on their cervix. Are younger than age 65 or older than age 66. Are African American. Are pregnant with twins or multiple babies (multiple gestation). Take street drugs or smoke while pregnant. Do not gain enough weight while pregnant. Became pregnant shortly after having been pregnant. What are the symptoms of  preterm labor? Symptoms of preterm labor include: Cramps similar to those that can happen during a menstrual period. The cramps may happen with diarrhea. Pain in the abdomen or lower back. Regular uterine contractions that may feel like tightening of the abdomen. A feeling of increased pressure in the pelvis. Increased watery or bloody mucus discharge from the vagina. Water breaking (ruptured amniotic sac). Why is it important to recognize signs of preterm labor? It is important to recognize signs of preterm labor because babies who are born prematurely may not be fully developed. This can put them at an increased risk for: Long-term (chronic) heart and lung problems. Difficulty immediately after birth with regulating body systems, including blood sugar, body temperature, heart rate, and breathing  rate. Bleeding in the brain. Cerebral palsy. Learning difficulties. Death. These risks are highest for babies who are born before 34 weeks of pregnancy. How is preterm labor treated? Treatment depends on the length of your pregnancy, your condition, and the health of your baby. It may involve: Having a stitch (suture) placed in your cervix to prevent your cervix from opening too early (cerclage). Taking or being given medicines, such as: Hormone medicines. These may be given early in pregnancy to help support the pregnancy. Medicine to stop contractions. Medicines to help mature the baby's lungs. These may be prescribed if the risk of delivery is high. Medicines to prevent your baby from developing cerebral palsy. If the labor happens before 34 weeks of pregnancy, you may need to stay in the hospital. What should I do if I think I am in preterm labor? If you think that you are going into preterm labor, call your health care provider right away. How can I prevent preterm labor in future pregnancies? To increase your chance of having a full-term pregnancy: Do not use any tobacco products, such as cigarettes, chewing tobacco, and e-cigarettes. If you need help quitting, ask your health care provider. Do not use street drugs or medicines that have not been prescribed to you during your pregnancy. Talk with your health care provider before taking any herbal supplements, even if you have been taking them regularly. Make sure you gain a healthy amount of weight during your pregnancy. Watch for infection. If you think that you might have an infection, get it checked right away. Make sure to tell your health care provider if you have gone into preterm labor before. This information is not intended to replace advice given to you by your health care provider. Make sure you discuss any questions you have with your health care provider. Document Revised: 07/17/2018 Document Reviewed:  08/16/2015 Elsevier Patient Education  2020 [employer].   Considering [place]? Guide for patients at Center for [employer] [facility]) Why consider waterbirth? Gentle birth for babies  Less pain medicine used in labor  May allow for passive descent/less pushing  May reduce perineal tears  More mobility and instinctive maternal position changes  Increased maternal relaxation   Is waterbirth safe? What are the risks of infection, drowning or other complications? Infection:  Very low risk (3.7 % for tub vs 4.8% for bed)  7 in 8000 waterbirths with documented infection  Poorly cleaned equipment most common cause  Slightly lower group B strep transmission rate  Drowning  Maternal:  Very low risk  Related to seizures or fainting  Newborn:  Very low risk. No evidence of increased risk of respiratory problems in multiple large studies  Physiological protection from breathing under water  Avoid underwater birth  if there are any fetal complications  Once baby's head is out of the water, keep it out.  Birth complication  Some reports of cord trauma, but risk decreased by bringing baby to surface gradually  No evidence of increased risk of shoulder dystocia. Mothers can usually change positions faster in water than in a bed, possibly aiding the maneuvers to free the shoulder.   There are 2 things you MUST do to have a waterbirth with [facility]: Attend a waterbirth class at [employer] & Children[facility] Center at [facility]   3rd Wednesday of every month from 7-9 pm (virtual during COVID) [employer] at [email or web address] or [email or web address] or by calling [phone] Bring us  the certificate from the class to your prenatal appointment or send via MyChart Meet with a midwife at 36 weeks* to see if you can still plan a waterbirth and to sign the consent.   *We also recommend that you schedule as many of your prenatal visits with a midwife as possible.    Helpful  information: You may want to bring a bathing suit top to the hospital to wear during labor but this is optional.  All other supplies are provided by the hospital. Please arrive at the hospital with signs of active labor, and do not wait at home until late in labor. It takes 45 min- 1 hour for fetal monitoring, and check in to your room to take place, plus transport and filling of the waterbirth tub.    Things that would prevent you from having a waterbirth: Premature, <37wks  Previous cesarean birth  Presence of thick meconium-stained fluid  Multiple gestation (Twins, triplets, etc.)  Uncontrolled diabetes or gestational diabetes requiring medication  Hypertension diagnosed in pregnancy or preexisting hypertension (gestational hypertension, preeclampsia, or chronic hypertension) Fetal growth restriction (your baby measures less than 10th percentile on ultrasound) Heavy vaginal bleeding  Non-reassuring fetal heart rate  Active infection (MRSA, etc.). Group B Strep is NOT a contraindication for waterbirth.  If your labor has to be induced and induction method requires continuous monitoring of the baby's heart rate  Other risks/issues identified by your obstetrical provider   Please remember that birth is unpredictable. Under certain unforeseeable circumstances your provider may advise against giving birth in the tub. These decisions will be made on a case-by-case basis and with the safety of you and your baby as our highest priority.    Updated 07/11/21

## 2023-08-21 NOTE — Progress Notes (Signed)
 LOW-RISK PREGNANCY VISIT Patient name: Pamela Mccarty MRN [id number]  Date of birth: May 10, 1995 Chief Complaint:   Routine Prenatal Visit (Lost some mucus plus sunday)  History of Present Illness:   Pamela Mccarty is a 28 y.o. [phone] female at [redacted]w[redacted]d with an Estimated Date of Delivery: 10/29/23 being seen today for ongoing management of a low-risk pregnancy.   Today she reports mucousy d/c on Sunday. No vb or lof. Denies abnormal discharge, itching/odor/irritation.  Urinary frequency, no dysuria. Taking trazadone to help sleep (not on med list), states rx'd by PCP, giving her bad nightmares.  Contractions: Irregular.  .  Movement: Present. denies leaking of fluid.     08/07/2023    9:54 AM 06/30/2023    8:37 AM 05/15/2023   10:59 AM 12/26/2020    8:48 AM 09/18/2020    9:24 AM  Depression screen PHQ 2/9  Decreased Interest 0 0 0 0 0  Down, Depressed, Hopeless 0 1 0 0 0  PHQ - 2 Score 0 1 0 0 0  Altered sleeping 0 3 2 2  0  Tired, decreased energy 0 3 2 2  0  Change in appetite 0 0 2 2 0  Feeling bad or failure about yourself  0 0 0 0 0  Trouble concentrating 0 0 0 0 0  Moving slowly or fidgety/restless 0 0 0 0 0  Suicidal thoughts 0 0 0 0 0  PHQ-9 Score 0 7 6 6  0  Difficult doing work/chores  Not difficult at all           08/07/2023    9:54 AM 06/30/2023    8:38 AM 05/15/2023   10:59 AM 12/26/2020    8:49 AM  GAD 7 : Generalized Anxiety Score  Nervous, Anxious, on Edge 0 0 0 0  Control/stop worrying 0 0 0 0  Worry too much - different things 0 0 0 0  Trouble relaxing 0 0 0 0  Restless 0 0 0 0  Easily annoyed or irritable 0 0 1 0  Afraid - awful might happen 0 0 0 0  Total GAD 7 Score 0 0 1 0  Anxiety Difficulty  Not difficult at all        Review of Systems:   Pertinent items are noted in HPI Denies abnormal vaginal discharge w/ itching/odor/irritation, headaches, visual changes, shortness of breath, chest pain, abdominal pain, severe nausea/vomiting, or problems  with urination or bowel movements unless otherwise stated above. Pertinent History Reviewed:  Reviewed past medical,surgical, social, obstetrical and family history.  Reviewed problem list, medications and allergies. Physical Assessment:   Vitals:   08/21/23 0903  BP: 113/73  Pulse: 94  Weight: 217 lb (98.4 kg)  Body mass index is 36.11 kg/m.        Physical Examination:   General appearance: Well appearing, and in no distress  Mental status: Alert, oriented to person, place, and time  Skin: Warm & dry  Cardiovascular: Normal heart rate noted  Respiratory: Normal respiratory effort, no distress  Abdomen: Soft, gravid, nontender  Pelvic: Spec exam: cx visually long/closed, normal d/c  Dilation: Closed Effacement (%): Thick Station: Ballotable  Extremities: Edema: Trace  Fetal Status: Fetal Heart Rate (bpm): 144 Fundal Height: 30 cm Movement: Present    Chaperone: Peggy Dones Results for orders placed or performed in visit on 08/21/23 (from the past 24 hours)  POC Urinalysis Dipstick OB   Collection Time: 08/21/23  9:51 AM  Result Value Ref Range  Color, UA     Clarity, UA     Glucose, UA Negative Negative   Bilirubin, UA     Ketones, UA negative    Spec Grav, UA     Blood, UA negative    pH, UA     POC,PROTEIN,UA Trace Negative, Trace, Small (1+), Moderate (2+), Large (3+), 4+   Urobilinogen, UA     Nitrite, UA negative    Leukocytes, UA Negative Negative   Appearance     Odor      Assessment & Plan:  1) Low-risk pregnancy [id number] at [redacted]w[redacted]d with an Estimated Date of Delivery: 10/29/23   2) H/O 31wk PTB d/t PPROM, normal exam today, reviewed ptl s/s, reasons to seek care  3) Urinary frequency> rx macrobid , send urine cx  4) Nightmares> thinks is from trazadone rx'd by PCP, discuss w/ PCP if needs to taper off. Gave printed sleep info  5) Interested in waterbirth> gave printed info, sign up for class and send certificate   Meds:  Meds ordered this encounter   Medications   nitrofurantoin , macrocrystal-monohydrate, (MACROBID ) 100 MG capsule    Sig: Take 1 capsule (100 mg total) by mouth 2 (two) times daily. X 7 days    Dispense:  14 capsule    Refill:  0   Labs/procedures today: spec exam, SVE, and urine culture, tdap  Plan:  Continue routine obstetrical care  Next visit: prefers in person    Reviewed: Preterm labor symptoms and general obstetric precautions including but not limited to vaginal bleeding, contractions, leaking of fluid and fetal movement were reviewed in detail with the patient.  All questions were answered. Does have home bp cuff. Office bp cuff given: not applicable. Check bp weekly, let us  know if consistently >140 and/or >90.  Follow-up: Return in about 2 weeks (around 09/04/2023) for LROB, CNM, in person.  Future Appointments  Date Time Provider Department Center  09/04/2023  1:50 PM Majel Scott, CNM [facility]  12/31/2023  8:40 AM Grooms, Jearlean Mince, PA-C [facility]    Orders Placed This Encounter  Procedures   Urine Culture   Tdap vaccine greater than or equal to 7yo IM   POC Urinalysis Dipstick OB   DOTTIE SANTALUCIA CNM, [facility] 08/21/2023 9:55 AM

## 2023-08-23 LAB — URINE CULTURE

## 2023-08-25 ENCOUNTER — Inpatient Hospital Stay (HOSPITAL_COMMUNITY)
Admission: AD | Admit: 2023-08-25 | Discharge: 2023-08-25 | Disposition: A | Discharge status: Home / Self Care | Attending: Obstetrics and Gynecology | Admitting: Obstetrics and Gynecology

## 2023-08-25 ENCOUNTER — Other Ambulatory Visit: Payer: Self-pay

## 2023-08-25 ENCOUNTER — Ambulatory Visit: Payer: Self-pay | Admitting: Women's Health

## 2023-08-25 ENCOUNTER — Encounter (HOSPITAL_COMMUNITY): Payer: Self-pay | Admitting: Obstetrics and Gynecology

## 2023-08-25 ENCOUNTER — Inpatient Hospital Stay (HOSPITAL_BASED_OUTPATIENT_CLINIC_OR_DEPARTMENT_OTHER)

## 2023-08-25 DIAGNOSIS — R102 Pelvic and perineal pain unspecified side: Secondary | ICD-10-CM

## 2023-08-25 DIAGNOSIS — O09213 Supervision of pregnancy with history of pre-term labor, third trimester: Secondary | ICD-10-CM | POA: Diagnosis not present

## 2023-08-25 DIAGNOSIS — Z3A30 30 weeks gestation of pregnancy: Secondary | ICD-10-CM | POA: Diagnosis not present

## 2023-08-25 DIAGNOSIS — O42913 Preterm premature rupture of membranes, unspecified as to length of time between rupture and onset of labor, third trimester: Secondary | ICD-10-CM | POA: Diagnosis not present

## 2023-08-25 DIAGNOSIS — O99353 Diseases of the nervous system complicating pregnancy, third trimester: Secondary | ICD-10-CM | POA: Insufficient documentation

## 2023-08-25 DIAGNOSIS — O99343 Other mental disorders complicating pregnancy, third trimester: Secondary | ICD-10-CM | POA: Diagnosis not present

## 2023-08-25 DIAGNOSIS — F419 Anxiety disorder, unspecified: Secondary | ICD-10-CM | POA: Insufficient documentation

## 2023-08-25 DIAGNOSIS — Z79899 Other long term (current) drug therapy: Secondary | ICD-10-CM | POA: Diagnosis not present

## 2023-08-25 DIAGNOSIS — O212 Late vomiting of pregnancy: Secondary | ICD-10-CM | POA: Insufficient documentation

## 2023-08-25 DIAGNOSIS — O4703 False labor before 37 completed weeks of gestation, third trimester: Secondary | ICD-10-CM | POA: Insufficient documentation

## 2023-08-25 DIAGNOSIS — G40909 Epilepsy, unspecified, not intractable, without status epilepticus: Secondary | ICD-10-CM | POA: Diagnosis not present

## 2023-08-25 DIAGNOSIS — Z0371 Encounter for suspected problem with amniotic cavity and membrane ruled out: Secondary | ICD-10-CM | POA: Diagnosis not present

## 2023-08-25 DIAGNOSIS — O26893 Other specified pregnancy related conditions, third trimester: Secondary | ICD-10-CM

## 2023-08-25 LAB — WET PREP, GENITAL
Clue Cells Wet Prep HPF POC: NONE SEEN
Sperm: NONE SEEN
Trich, Wet Prep: NONE SEEN
WBC, Wet Prep HPF POC: >=10 — AB (ref <10)
Yeast Wet Prep HPF POC: NONE SEEN

## 2023-08-25 LAB — RUPTURE OF MEMBRANE (ROM)PLUS: Rom Plus: NEGATIVE

## 2023-08-25 NOTE — MAU Note (Signed)
 MAU Triage Note  Pamela Mccarty is a 28 y.o. at [redacted]w[redacted]d here in MAU reporting: hx of PTD at 31 weeks. Reports she's been ctx since yesterday and they've gotten closer within the past hour, she states they are now 5-6 minutes apart. She reports around 10:30 am her shorts and underwear were soaked with clear fluid and she reports she believes she is still continuing to leak but is unsure. Endorses +FM, Denies VB.    Onset of complaint: ctx since last night, possible ROM approx. 10am Pain score: 8/10 Vitals:   08/25/23 1417 08/25/23 1431  BP: 107/65 107/66  Pulse: (!) 108 (!) 105  Resp: 18   Temp: 98.4 F (36.9 C)   SpO2: 99%      FHT: 144  Lab orders placed from triage: fern

## 2023-08-25 NOTE — MAU Provider Note (Signed)
 History     [id number]  Arrival date and time: 08/25/23 1402    Chief Complaint  Patient presents with   Contractions   Rupture of Membranes     HPI Pamela Mccarty is a 28 y.o. 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   Review of outside prenatal records from [facility] (in media tab): getting routine care, at last visit reported some urinary frequency (urine culture was normal) and crampiness.   Around 0900 started having cramps Got off the couch and felt like everything was wet Went to the bathroom and had a gush of fluid and is worried her water broke Then felt like she needed to use the bathroom and started to have worsening contractions Fluid was clear Feels like she's still leaking a small amount of fluid, especially with sneezing No bleeding Feels like this cramping is different and more intense than what she described at her last office visit a few days ago Feels like the baby is moving down  Also states she's been throwing up a lot since early this morning Did not take any pictures Reports some streaks of blood   Fetal movement is normal   B/Positive/-- (01/22 1041)  OB History     Gravida  4   Para  1   Term      Preterm  1   AB  2   Living  1      SAB  2   IAB      Ectopic      Multiple  0   Live Births  1           Past Medical History:  Diagnosis Date   ADHD (attention deficit hyperactivity disorder)    Anxiety    Asthma    inhaler as a child   Depression    Epilepsy (HCC)    Headache(784.0)    Obesity    Seizures (HCC)    Seizures (HCC) 02/02/2021   per pt; 2/2 post partum blood loss    Past Surgical History:  Procedure Laterality Date   ADENOIDECTOMY     ANKLE SURGERY     ANKLE SURGERY  at 28yo   Pt. reports to lengthen her  tendons   TONSILLECTOMY      Family History  Problem Relation Age of Onset   Heart disease Maternal Grandfather    Heart attack Maternal Grandfather    Hypertension Father    Stroke Father    Seizures Father    Bipolar disorder Mother    Cancer Mother    Diabetes Mother    Hypertension Mother    Seizures Mother    Seizures Brother     Social History   Socioeconomic History   Marital status: Media planner    Spouse name: Herman Longs   Number of children: 1   Years of education: Not on file   Highest education level: Not on file  Occupational History   Occupation: Full Time    Comment: [employer]  Tobacco Use   Smoking status: Former    Current packs/day: 0.25    Types: Cigarettes   Smokeless tobacco: Never  Vaping Use   Vaping status: Never Used  Substance and Sexual Activity   Alcohol use: No   Drug use: Not Currently  Types: Marijuana    Comment: last used october 2024   Sexual activity: Yes    Partners: Male    Birth control/protection: None  Other Topics Concern   Not on file  Social History Narrative   Not on file   Social Drivers of Health   Financial Resource Strain: Low Risk  (05/15/2023)   Overall Financial Resource Strain (CARDIA)    Difficulty of Paying Living Expenses: Not hard at all  Food Insecurity: No Food Insecurity (05/15/2023)   Hunger Vital Sign    Worried About Running Out of Food in the Last Year: Never true    Ran Out of Food in the Last Year: Never true  Transportation Needs: No Transportation Needs (05/15/2023)   PRAPARE - Administrator, Civil Service (Medical): No    Lack of Transportation (Non-Medical): No  Physical Activity: Insufficiently Active (05/15/2023)   Exercise Vital Sign    Days of Exercise per Week: 2 days    Minutes of Exercise per Session: 30 min  Stress: No Stress Concern Present (05/15/2023)   [employer] of Occupational Health - Occupational Stress Questionnaire    Feeling of Stress : Only a little   Social Connections: Moderately Integrated (05/15/2023)   Social Connection and Isolation Panel [NHANES]    Frequency of Communication with Friends and Family: More than three times a week    Frequency of Social Gatherings with Friends and Family: Twice a week    Attends Religious Services: 1 to 4 times per year    Active Member of [employer] or Organizations: Not on file    Attends Banker Meetings: Never    Marital Status: Living with partner  Intimate Partner Violence: Not At Risk (05/15/2023)   Humiliation, Afraid, Rape, and Kick questionnaire    Fear of Current or Ex-Partner: No    Emotionally Abused: No    Physically Abused: No    Sexually Abused: No    Allergies  Allergen Reactions   Peanut-Containing Drug Products Anaphylaxis    Stomach bubbles with Peanut oil only  (Patient states that she is not allergic to this)   Latex Other (See Comments)    Redness at site   Soy Allergy (Obsolete) Hives   Tomato     Throat swelling, shortness of breath     No current facility-administered medications on file prior to encounter.   Current Outpatient Medications on File Prior to Encounter  Medication Sig Dispense Refill   acetaminophen  (TYLENOL ) 500 MG tablet Take 500 mg by mouth every 4 (four) hours as needed for moderate pain (pain score 4-6) or fever.     Doxylamine -Pyridoxine  (DICLEGIS ) 10-10 MG TBEC Take 2 qhs; may also take one in am and one in afternoon prn nausea 120 tablet 6   ondansetron  (ZOFRAN ) 4 MG tablet Take 1 tablet (4 mg total) by mouth every 6 (six) hours. 30 tablet 2   prenatal vitamin w/FE, FA (PRENATAL 1 + 1) 27-1 MG TABS tablet Take 1 tablet by mouth daily at 12 noon. 90 tablet 3   promethazine  (PHENERGAN ) 25 MG tablet Take 1 tablet (25 mg total) by mouth every 6 (six) hours as needed for nausea or vomiting. 30 tablet 1   sertraline  (ZOLOFT ) 50 MG tablet Take 1 tablet (50 mg total) by mouth daily. 30 tablet 3   amoxicillin  (AMOXIL ) 500 MG capsule Take 500  mg by mouth 3 (three) times daily. (Patient not taking: Reported on 08/21/2023)     Blood  Pressure Monitor MISC For regular home bp monitoring during pregnancy 1 each 0   cyclobenzaprine  (FLEXERIL ) 10 MG tablet Take 1 tablet (10 mg total) by mouth 2 (two) times daily as needed for muscle spasms. 20 tablet 0   HYDROcodone -acetaminophen  (NORCO/VICODIN) 5-325 MG tablet Take 1 tablet by mouth every 6 (six) hours as needed. (Patient not taking: Reported on 08/21/2023)     nitrofurantoin , macrocrystal-monohydrate, (MACROBID ) 100 MG capsule Take 1 capsule (100 mg total) by mouth 2 (two) times daily. X 7 days 14 capsule 0   ondansetron  (ZOFRAN ) 4 MG tablet Take 2 tablets (8 mg total) by mouth 2 (two) times daily. (Patient not taking: Reported on 08/21/2023) 20 tablet 0     ROS Pertinent positives and negative per HPI, all others reviewed and negative  Physical Exam   BP 107/66   Pulse (!) 105   Temp 98.4 F (36.9 C) (Oral)   Resp 18   LMP 01/07/2023   SpO2 99%   Patient Vitals for the past 24 hrs:  BP Temp Temp src Pulse Resp SpO2  08/25/23 1431 107/66 -- -- (!) 105 -- --  08/25/23 1417 107/65 98.4 F (36.9 C) Oral (!) 108 18 99 %    Physical Exam Vitals reviewed.  Constitutional:      General: She is not in acute distress.    Appearance: She is well-developed. She is not diaphoretic.  Eyes:     General: No scleral icterus. Pulmonary:     Effort: Pulmonary effort is normal. No respiratory distress.  Abdominal:     General: There is no distension.     Palpations: Abdomen is soft.     Tenderness: There is no abdominal tenderness. There is no guarding or rebound.  Genitourinary:    Comments: External genitalia normal SSE: Vaginal vault normal, neg pool, neg valsalva, small amount of white discharge which appears physiologic SVE: cervix closed Skin:    General: Skin is warm and dry.  Neurological:     Mental Status: She is alert.     Coordination: Coordination normal.       Cervical Exam Dilation: Closed Exam by:: Ilona Malta, MD  Bedside Ultrasound Pt informed that the ultrasound is considered a limited OB ultrasound and is not intended to be a complete ultrasound exam.  Patient also informed that the ultrasound is not being completed with the intent of assessing for fetal or placental anomalies or any pelvic abnormalities.  Explained that the purpose of today's ultrasound is to assess for  AFI.  Patient acknowledges the purpose of the exam and the limitations of the study.      My interpretation: only one pocket of fluid found in LLQ, measuring ~4 cm, no other fluid pockets seen  FHT Baseline: 145 bpm Variability: Good {> 6 bpm) Accelerations: Reactive Decelerations: Absent Uterine activity: None Cat: I  Labs Results for orders placed or performed during the hospital encounter of 08/25/23 (from the past 24 hours)  Wet prep, genital     Status: Abnormal   Collection Time: 08/25/23  3:03 PM   Specimen: PATH Cytology Cervicovaginal Ancillary Only  Result Value Ref Range   Yeast Wet Prep HPF POC NONE SEEN NONE SEEN   Trich, Wet Prep NONE SEEN NONE SEEN   Clue Cells Wet Prep HPF POC NONE SEEN NONE SEEN   WBC, Wet Prep HPF POC >=10 (A) <10   Sperm NONE SEEN   Rupture of Membrane (ROM) Plus     Status: None  Collection Time: 08/25/23  3:03 PM  Result Value Ref Range   Rom Plus NEGATIVE     Imaging No results found.       MAU Course  Procedures Lab Orders         Wet prep, genital         Rupture of Membrane (ROM) Plus         Fern Test     No orders of the defined types were placed in this encounter.  Imaging Orders         US  MFM OB Limited      MDM Moderate (Level 3-4)  Assessment and Plan  #Abdominal pain in pregnancy, third trimester, Encounter for suspected rupture of membranes, with rupture of membranes not found #[redacted] weeks gestation of pregnancy Unremarkable speculum exam but some concern on bedside US  for oligohydramnios,  formal US  shows AFI 11 cm with MVP 5.2, and ROM Plus was negative, ruled out for rupture at this point. Wet prep also negative. One small contraction seen, otherwise no significant activity on tocometer.   Patient understandably very anxious about going home [place] and delivered around this time during last pregnancy, she inquired about being admitted for observation. We discussed that given lack of uterine activity and that she has ruled out for rupture I would not recommend admission at that time. We discussed that OB is always unpredictable and the situation may change but for now would recommend close observation at home. She is OK with this. We discussed return precautions.   #FWB FHT Cat I NST: Reactive   Dispo: discharged to home in stable condition    Teena Feast, MD/MPH 08/25/23 4:29 PM  Allergies as of 08/25/2023       Reactions   Peanut-containing Drug Products Anaphylaxis   Stomach bubbles with Peanut oil only (Patient states that she is not allergic to this)   Latex Other (See Comments)   Redness at site   Soy Allergy (obsolete) Hives   Tomato    Throat swelling, shortness of breath        Medication List     STOP taking these medications    amoxicillin  500 MG capsule Commonly known as: AMOXIL    HYDROcodone -acetaminophen  5-325 MG tablet Commonly known as: NORCO/VICODIN       TAKE these medications    acetaminophen  500 MG tablet Commonly known as: TYLENOL  Take 500 mg by mouth every 4 (four) hours as needed for moderate pain (pain score 4-6) or fever.   Blood Pressure Monitor Misc For regular home bp monitoring during pregnancy   cyclobenzaprine  10 MG tablet Commonly known as: FLEXERIL  Take 1 tablet (10 mg total) by mouth 2 (two) times daily as needed for muscle spasms.   Doxylamine -Pyridoxine  10-10 MG Tbec Commonly known as: Diclegis  Take 2 qhs; may also take one in am and one in afternoon prn nausea    nitrofurantoin  (macrocrystal-monohydrate) 100 MG capsule Commonly known as: MACROBID  Take 1 capsule (100 mg total) by mouth 2 (two) times daily. X 7 days   ondansetron  4 MG tablet Commonly known as: ZOFRAN  Take 1 tablet (4 mg total) by mouth every 6 (six) hours. What changed: Another medication with the same name was removed. Continue taking this medication, and follow the directions you see here.   prenatal vitamin w/FE, FA 27-1 MG Tabs tablet Take 1 tablet by mouth daily at 12 noon.   promethazine  25 MG tablet Commonly known  as: PHENERGAN  Take 1 tablet (25 mg total) by mouth every 6 (six) hours as needed for nausea or vomiting.   sertraline  50 MG tablet Commonly known as: ZOLOFT  Take 1 tablet (50 mg total) by mouth daily.

## 2023-09-04 ENCOUNTER — Encounter: Payer: Self-pay | Admitting: Advanced Practice Midwife

## 2023-09-04 ENCOUNTER — Ambulatory Visit: Admitting: Advanced Practice Midwife

## 2023-09-04 VITALS — BP 114/74 | HR 99 | Wt 220.4 lb

## 2023-09-04 DIAGNOSIS — Z3483 Encounter for supervision of other normal pregnancy, third trimester: Secondary | ICD-10-CM

## 2023-09-04 DIAGNOSIS — Z3A32 32 weeks gestation of pregnancy: Secondary | ICD-10-CM | POA: Diagnosis not present

## 2023-09-04 DIAGNOSIS — Z0371 Encounter for suspected problem with amniotic cavity and membrane ruled out: Secondary | ICD-10-CM

## 2023-09-04 DIAGNOSIS — Z3480 Encounter for supervision of other normal pregnancy, unspecified trimester: Secondary | ICD-10-CM

## 2023-09-04 DIAGNOSIS — Z8751 Personal history of pre-term labor: Secondary | ICD-10-CM | POA: Diagnosis not present

## 2023-09-04 NOTE — Progress Notes (Addendum)
   LOW-RISK PREGNANCY VISIT Patient name: Pamela Mccarty MRN [id number]  Date of birth: [birth year] Chief Complaint:   Routine Prenatal Visit (Think might have leakage)  History of Present Illness:   Pamela Mccarty is a 28 y.o. [phone] female at [redacted]w[redacted]d with an Estimated Date of Delivery: 10/29/23 being seen today for ongoing management of a low-risk pregnancy.  Today she reports sharp pelvic pains. Contractions: Irregular. Vag. Bleeding: None.  Movement: Present. Reports leaking of fluid.  Has had multiple instances of leaking large amounts of fluid throughout the pregnancy.  Review of Systems:   Pertinent items are noted in HPI Denies abnormal vaginal discharge w/ itching/odor/irritation, headaches, visual changes, shortness of breath, chest pain, abdominal pain, severe nausea/vomiting, or problems with urination or bowel movements unless otherwise stated above. Pertinent History Reviewed:  Reviewed past medical,surgical, social, obstetrical and family history.  Reviewed problem list, medications and allergies. Physical Assessment:   Vitals:   09/04/23 1346  BP: 114/74  Pulse: 99  Weight: 100 kg  Body mass index is 36.68 kg/m.        Physical Examination:   General appearance: Well appearing, and in no distress  Mental status: Alert, oriented to person, place, and time  Skin: Warm & dry  Cardiovascular: Normal heart rate noted  Respiratory: Normal respiratory effort, no distress  Abdomen: Soft, gravid,   Pelvic: sterile speculum exam, pink vaginal mucosa with thick, white vaginal  discharge present         Extremities: Edema: Trace Chaperone:  Monda Angry, CNM   Fetal Status: Fetal Heart Rate (bpm): 135 Fundal Height: 32 cm Movement: Present     No results found for this or any previous visit (from the past 24 hours).  Assessment & Plan:    Pregnancy: L2G4010 at [redacted]w[redacted]d 1. Supervision of other normal pregnancy, antepartum (Primary) Intermittent sharp pain  in pelvis, describes as near symphysis pubis, discussed how to change positions to minimize pain   2. [redacted] weeks gestation of pregnancy Routine OB visits every 2 weeks  3. Encounter for suspected premature rupture of membranes, with rupture of membranes not found Reports continuing to leak fluid throughout the day since MAU visit on 08/25/23. No color or odor to fluid. Sterile speculum exam performed, thick white vaginal discharge seen, no fluid seen with cough, amnisure negative, Fern negative   4. History of preterm delivery Preterm birth at 29w. Precautions reviewed.  Meds: No orders of the defined types were placed in this encounter.  Labs/procedures today: Speculum exam, Amnisure, Fern  Plan:  Continue routine obstetrical care  Next visit: in-person or virtual    Reviewed: Preterm labor symptoms and general obstetric precautions including but not limited to vaginal bleeding, contractions, leaking of fluid and fetal movement were reviewed in detail with the patient.  All questions were answered.  Follow-up: No follow-ups on file.  Future Appointments  Date Time Provider Department Center  12/31/2023  8:40 AM [place], Jearlean Mince, [place] [facility]   No orders of the defined types were placed in this encounter.  Fonda Hymen, SNM 09/04/2023 2:27 PM  I personally saw and evaluated the patient, performing the key elements of the service. I developed and verified the management plan that is described in the resident's/student's note, and I agree with the content with my edits above. Monda Angry, CNM 09/04/2023 7:22 PM

## 2023-09-11 ENCOUNTER — Encounter (HOSPITAL_COMMUNITY): Payer: Self-pay | Admitting: Obstetrics and Gynecology

## 2023-09-11 ENCOUNTER — Inpatient Hospital Stay (HOSPITAL_COMMUNITY)
Admission: AD | Admit: 2023-09-11 | Discharge: 2023-09-11 | Disposition: A | Discharge status: Home / Self Care | Attending: Family Medicine | Admitting: Family Medicine

## 2023-09-11 ENCOUNTER — Encounter: Payer: Self-pay | Admitting: Women's Health

## 2023-09-11 ENCOUNTER — Encounter: Admitting: Women's Health

## 2023-09-11 DIAGNOSIS — O09293 Supervision of pregnancy with other poor reproductive or obstetric history, third trimester: Secondary | ICD-10-CM | POA: Insufficient documentation

## 2023-09-11 DIAGNOSIS — O26899 Other specified pregnancy related conditions, unspecified trimester: Secondary | ICD-10-CM

## 2023-09-11 DIAGNOSIS — O26892 Other specified pregnancy related conditions, second trimester: Secondary | ICD-10-CM | POA: Diagnosis not present

## 2023-09-11 DIAGNOSIS — Z3A33 33 weeks gestation of pregnancy: Secondary | ICD-10-CM

## 2023-09-11 DIAGNOSIS — Z0371 Encounter for suspected problem with amniotic cavity and membrane ruled out: Secondary | ICD-10-CM | POA: Diagnosis present

## 2023-09-11 DIAGNOSIS — R102 Pelvic and perineal pain: Secondary | ICD-10-CM

## 2023-09-11 DIAGNOSIS — O4703 False labor before 37 completed weeks of gestation, third trimester: Secondary | ICD-10-CM | POA: Insufficient documentation

## 2023-09-11 DIAGNOSIS — O4290 Premature rupture of membranes, unspecified as to length of time between rupture and onset of labor, unspecified weeks of gestation: Secondary | ICD-10-CM

## 2023-09-11 LAB — URINALYSIS, ROUTINE W REFLEX MICROSCOPIC
Bilirubin Urine: NEGATIVE
Glucose, UA: NEGATIVE mg/dL
Hgb urine dipstick: NEGATIVE
Ketones, ur: NEGATIVE mg/dL
Leukocytes,Ua: NEGATIVE
Nitrite: NEGATIVE
Protein, ur: NEGATIVE mg/dL
Specific Gravity, Urine: 1.018 (ref 1.005–1.030)
pH: 6 (ref 5.0–8.0)

## 2023-09-11 LAB — WET PREP, GENITAL
Clue Cells Wet Prep HPF POC: NONE SEEN
Sperm: NONE SEEN
Trich, Wet Prep: NONE SEEN
WBC, Wet Prep HPF POC: <10 (ref <10)
Yeast Wet Prep HPF POC: NONE SEEN

## 2023-09-11 LAB — RUPTURE OF MEMBRANE (ROM)PLUS: Rom Plus: NEGATIVE

## 2023-09-11 LAB — POCT FERN TEST: POCT Fern Test: NEGATIVE

## 2023-09-11 MED ORDER — CYCLOBENZAPRINE HCL 10 MG PO TABS: 10.0000 mg | ORAL_TABLET | Freq: Two times a day (BID) | ORAL | 0 refills | Status: DC | PRN

## 2023-09-11 NOTE — MAU Provider Note (Signed)
 History     CSN: [id number]  Arrival date and time: 09/11/23 1430   None     Chief Complaint  Patient presents with   Abdominal Pain   Pelvic Pain   HPI Patient is a G4, P1 at 33 weeks and 1 day presenting for concern for leaking of fluids and pelvic pressure.  Reports she noticed leakage of fluids which started at 10 AM today.  History of preterm delivery at 30 weeks and she feels like she was having the same symptoms.  Reports she is also feeling contractions.  No vaginal bleeding.  No other symptoms.  OB History     Gravida  4   Para  1   Term      Preterm  1   AB  2   Living  1      SAB  2   IAB      Ectopic      Multiple  0   Live Births  1           Past Medical History:  Diagnosis Date   ADHD (attention deficit hyperactivity disorder)    Anxiety    Asthma    inhaler as a child   Depression    Epilepsy (HCC)    Headache(784.0)    Obesity    Seizures (HCC)    Seizures (HCC) 02/02/2021   per pt; 2/2 post partum blood loss    Past Surgical History:  Procedure Laterality Date   ADENOIDECTOMY     ANKLE SURGERY     ANKLE SURGERY  at 28yo   Pt. reports to lengthen her tendons   TONSILLECTOMY      Family History  Problem Relation Age of Onset   Heart disease Maternal Grandfather    Heart attack Maternal Grandfather    Hypertension Father    Stroke Father    Seizures Father    Bipolar disorder Mother    Cancer Mother    Diabetes Mother    Hypertension Mother    Seizures Mother    Seizures Brother     Social History   Tobacco Use   Smoking status: Former    Current packs/day: 0.25    Types: Cigarettes   Smokeless tobacco: Never  Vaping Use   Vaping status: Never Used  Substance Use Topics   Alcohol use: No   Drug use: Not Currently    Types: Marijuana    Comment: last used october 2024    Allergies:  Allergies  Allergen Reactions   Peanut-Containing Drug Products Anaphylaxis    Stomach bubbles with Peanut oil  only  (Patient states that she is not allergic to this)   Latex Other (See Comments)    Redness at site   Soy Allergy (Obsolete) Hives   Tomato     Throat swelling, shortness of breath     Medications Prior to Admission  Medication Sig Dispense Refill Last Dose/Taking   acetaminophen  (TYLENOL ) 500 MG tablet Take 500 mg by mouth every 4 (four) hours as needed for moderate pain (pain score 4-6) or fever.      Blood Pressure Monitor MISC For regular home bp monitoring during pregnancy 1 each 0    cyclobenzaprine  (FLEXERIL ) 10 MG tablet Take 1 tablet (10 mg total) by mouth 2 (two) times daily as needed for muscle spasms. (Patient not taking: Reported on 09/04/2023) 20 tablet 0    Doxylamine -Pyridoxine  (DICLEGIS ) 10-10 MG TBEC Take 2 qhs; may also take one in  am and one in afternoon prn nausea 120 tablet 6    nitrofurantoin , macrocrystal-monohydrate, (MACROBID ) 100 MG capsule Take 1 capsule (100 mg total) by mouth 2 (two) times daily. X 7 days (Patient not taking: Reported on 09/04/2023) 14 capsule 0    ondansetron  (ZOFRAN ) 4 MG tablet Take 1 tablet (4 mg total) by mouth every 6 (six) hours. 30 tablet 2    prenatal vitamin w/FE, FA (PRENATAL 1 + 1) 27-1 MG TABS tablet Take 1 tablet by mouth daily at 12 noon. 90 tablet 3    promethazine  (PHENERGAN ) 25 MG tablet Take 1 tablet (25 mg total) by mouth every 6 (six) hours as needed for nausea or vomiting. 30 tablet 1    sertraline  (ZOLOFT ) 50 MG tablet Take 1 tablet (50 mg total) by mouth daily. 30 tablet 3     Review of Systems  Gastrointestinal:  Positive for abdominal pain.  Genitourinary:  Positive for vaginal discharge and vaginal pain. Negative for vaginal bleeding.  All other systems reviewed and are negative.  Physical Exam   Blood pressure 131/69, pulse 88, temperature 98.1 F (36.7 C), resp. rate 16, last menstrual period 01/07/2023, SpO2 99%.  Physical Exam Vitals and nursing note reviewed.  Constitutional:      Appearance: Normal  appearance.  HENT:     Head: Normocephalic and atraumatic.     Nose: No congestion or rhinorrhea.  Eyes:     Extraocular Movements: Extraocular movements intact.  Cardiovascular:     Rate and Rhythm: Normal rate.  Pulmonary:     Effort: Pulmonary effort is normal.  Abdominal:     Palpations: Abdomen is soft.     Tenderness: There is no abdominal tenderness.  Genitourinary:    Comments: No pooling appreciated on speculum exam Musculoskeletal:        General: Normal range of motion.     Cervical back: Normal range of motion.  Skin:    General: Skin is warm.     Capillary Refill: Capillary refill takes less than 2 seconds.  Neurological:     General: No focal deficit present.     Mental Status: She is alert.     Cranial Nerves: No cranial nerve deficit.  Psychiatric:        Mood and Affect: Mood normal.        Behavior: Behavior normal.     MAU Course  Procedures  MDM Wet prep UA Fern test ROM plus  Assessment and Plan  Pamela Mccarty is a 28 year old presenting for leaking of fluids at 33 weeks and 1 day.  Leaking fluid Patient reports leaking fluid since 10 AM.  Physical exam showing no pooling in speculum.  Fern swab collected which was negative.  ROM plus collected which was also negative.  Wet prep negative for any acute infections.  Discussed that this may have been urine or other causes.  NST was reassuring with base rate of 125, accelerations, no decelerations.  No contractions appreciated on monitoring.  Discussed strict return precautions and patient had no further questions.  Patient discharged home.  Witt Plitt V Srihan Brutus 09/11/2023, 4:47 PM

## 2023-09-11 NOTE — MAU Note (Signed)
 Pamela Mccarty is a 28 y.o. at [redacted]w[redacted]d here in MAU reporting: contractions since this am, having a lot of pressure in her vagina. Reports ? Leaking fluid since 10:30 and feels like the bag of water "is in my vagina". Denies bleeding. Reports positive fetal movement.   Onset of complaint: this am Pain score: 10/10 There were no vitals filed for this visit.   FHT: 140  Lab orders placed from triage: none

## 2023-09-11 NOTE — Discharge Instructions (Signed)
 It was a pleasure taking care of you tonight.  I am sorry you are having almost pressure.  We checked the safety water was broken and it is not.  We also did not see any contractions on your monitoring.  Your baby looks wonderful.  I recommend you getting a pregnancy support band to help with the pressure in your pelvis.  If you start leaking again or have any other concerns please return for further evaluation.  Hope you have a wonderful night!

## 2023-09-18 ENCOUNTER — Ambulatory Visit: Admitting: Advanced Practice Midwife

## 2023-09-18 ENCOUNTER — Encounter: Payer: Self-pay | Admitting: Advanced Practice Midwife

## 2023-09-18 VITALS — BP 114/70 | HR 89 | Wt 224.0 lb

## 2023-09-18 DIAGNOSIS — Z3480 Encounter for supervision of other normal pregnancy, unspecified trimester: Secondary | ICD-10-CM

## 2023-09-18 DIAGNOSIS — Z3A34 34 weeks gestation of pregnancy: Secondary | ICD-10-CM

## 2023-09-18 DIAGNOSIS — Z3483 Encounter for supervision of other normal pregnancy, third trimester: Secondary | ICD-10-CM

## 2023-09-18 NOTE — Progress Notes (Signed)
   LOW-RISK PREGNANCY VISIT Patient name: Pamela Mccarty MRN [id number]  Date of birth: 1995-11-07 Chief Complaint:   Routine Prenatal Visit  History of Present Illness:   Pamela Mccarty is a 28 y.o. [phone] female at [redacted]w[redacted]d with an Estimated Date of Delivery: 10/29/23 being seen today for ongoing management of a low-risk pregnancy.  Today she reports nightmares from taking Trazadone.  Plans to come off of that and try an OTC (list given). Contractions: Not present.  .  Movement: Present. denies leaking of fluid. Review of Systems:   Pertinent items are noted in HPI Denies abnormal vaginal discharge w/ itching/odor/irritation, headaches, visual changes, shortness of breath, chest pain, abdominal pain, severe nausea/vomiting, or problems with urination or bowel movements unless otherwise stated above. Pertinent History Reviewed:  Reviewed past medical,surgical, social, obstetrical and family history.  Reviewed problem list, medications and allergies. Physical Assessment:   Vitals:   09/18/23 1034  BP: 114/70  Pulse: 89  Weight: 224 lb (101.6 kg)  Body mass index is 37.28 kg/m.        Physical Examination:   General appearance: Well appearing, and in no distress  Mental status: Alert, oriented to person, place, and time  Skin: Warm & dry  Cardiovascular: Normal heart rate noted  Respiratory: Normal respiratory effort, no distress  Abdomen: Soft, gravid, nontender  Pelvic: Cervical exam deferred         Extremities: Edema: None Chaperone:  N/A   Fetal Status: Fetal Heart Rate (bpm): 140 Fundal Height: 34 cm Movement: Present      No results found for this or any previous visit (from the past 24 hours).  Assessment & Plan:    Pregnancy: [id number] at [redacted]w[redacted]d 1. Supervision of other normal pregnancy, antepartum (Primary)   2. [redacted] weeks gestation of pregnancy      Meds: No orders of the defined types were placed in this encounter.  Labs/procedures today: none  Plan:   Continue routine obstetrical care  Next visit: prefers in person    Reviewed: Preterm labor symptoms and general obstetric precautions including but not limited to vaginal bleeding, contractions, leaking of fluid and fetal movement were reviewed in detail with the patient.  All questions were answered. Has home bp cuff.. Check bp weekly, let us  know if >140/90.   Follow-up: Return in about 2 weeks (around 10/02/2023) for LROB.  Future Appointments  Date Time Provider Department Center  12/31/2023  8:40 AM Grooms, Jearlean Mince, [place] [facility]    No orders of the defined types were placed in this encounter.  Majel Scott DNP, CNM 09/18/2023 10:58 AM

## 2023-09-18 NOTE — Patient Instructions (Signed)

## 2023-09-26 ENCOUNTER — Encounter (HOSPITAL_COMMUNITY): Payer: Self-pay

## 2023-09-26 ENCOUNTER — Inpatient Hospital Stay (HOSPITAL_COMMUNITY)
Admission: EM | Admit: 2023-09-26 | Discharge: 2023-09-26 | Disposition: A | Discharge status: Home / Self Care | Attending: Obstetrics and Gynecology | Admitting: Obstetrics and Gynecology

## 2023-09-26 ENCOUNTER — Other Ambulatory Visit: Payer: Self-pay

## 2023-09-26 DIAGNOSIS — N760 Acute vaginitis: Secondary | ICD-10-CM | POA: Diagnosis not present

## 2023-09-26 DIAGNOSIS — Z3A35 35 weeks gestation of pregnancy: Secondary | ICD-10-CM | POA: Diagnosis not present

## 2023-09-26 DIAGNOSIS — O219 Vomiting of pregnancy, unspecified: Secondary | ICD-10-CM | POA: Diagnosis not present

## 2023-09-26 DIAGNOSIS — R109 Unspecified abdominal pain: Secondary | ICD-10-CM | POA: Diagnosis not present

## 2023-09-26 DIAGNOSIS — R1110 Vomiting, unspecified: Secondary | ICD-10-CM | POA: Diagnosis not present

## 2023-09-26 DIAGNOSIS — R1030 Lower abdominal pain, unspecified: Secondary | ICD-10-CM | POA: Diagnosis not present

## 2023-09-26 DIAGNOSIS — O23593 Infection of other part of genital tract in pregnancy, third trimester: Secondary | ICD-10-CM | POA: Diagnosis not present

## 2023-09-26 DIAGNOSIS — B9689 Other specified bacterial agents as the cause of diseases classified elsewhere: Secondary | ICD-10-CM

## 2023-09-26 DIAGNOSIS — O26893 Other specified pregnancy related conditions, third trimester: Secondary | ICD-10-CM | POA: Diagnosis not present

## 2023-09-26 LAB — CBC WITH DIFFERENTIAL/PLATELET
Abs Immature Granulocytes: 0.07 10*3/uL (ref 0.00–0.07)
Basophils Absolute: 0 10*3/uL (ref 0.0–0.1)
Basophils Relative: 0 %
Eosinophils Absolute: 0.1 10*3/uL (ref 0.0–0.5)
Eosinophils Relative: 1 %
HCT: 44.2 % (ref 36.0–46.0)
Hemoglobin: 14.7 g/dL (ref 12.0–15.0)
Immature Granulocytes: 1 %
Lymphocytes Relative: 22 %
Lymphs Abs: 2.1 10*3/uL (ref 0.7–4.0)
MCH: 29.4 pg (ref 26.0–34.0)
MCHC: 33.3 g/dL (ref 30.0–36.0)
MCV: 88.4 fL (ref 80.0–100.0)
Monocytes Absolute: 0.8 10*3/uL (ref 0.1–1.0)
Monocytes Relative: 9 %
Neutro Abs: 6.4 10*3/uL (ref 1.7–7.7)
Neutrophils Relative %: 67 %
Platelets: 230 10*3/uL (ref 150–400)
RBC: 5 MIL/uL (ref 3.87–5.11)
RDW: 12.8 % (ref 11.5–15.5)
WBC: 9.5 10*3/uL (ref 4.0–10.5)
nRBC: 0 % (ref 0.0–0.2)

## 2023-09-26 LAB — URINALYSIS, ROUTINE W REFLEX MICROSCOPIC
Bilirubin Urine: NEGATIVE
Glucose, UA: NEGATIVE mg/dL
Hgb urine dipstick: NEGATIVE
Ketones, ur: NEGATIVE mg/dL
Leukocytes,Ua: NEGATIVE
Nitrite: NEGATIVE
Protein, ur: NEGATIVE mg/dL
Specific Gravity, Urine: 1.02 (ref 1.005–1.030)
pH: 6 (ref 5.0–8.0)

## 2023-09-26 LAB — COMPREHENSIVE METABOLIC PANEL WITH GFR
ALT: 12 U/L (ref 0–44)
AST: 12 U/L — ABNORMAL LOW (ref 15–41)
Albumin: 2.8 g/dL — ABNORMAL LOW (ref 3.5–5.0)
Alkaline Phosphatase: 93 U/L (ref 38–126)
Anion gap: 9 (ref 5–15)
BUN: 11 mg/dL (ref 6–20)
CO2: 21 mmol/L — ABNORMAL LOW (ref 22–32)
Calcium: 9 mg/dL (ref 8.9–10.3)
Chloride: 107 mmol/L (ref 98–111)
Creatinine, Ser: 0.64 mg/dL (ref 0.44–1.00)
GFR, Estimated: >60 mL/min (ref >60)
Glucose, Bld: 80 mg/dL (ref 70–99)
Potassium: 3.9 mmol/L (ref 3.5–5.1)
Sodium: 137 mmol/L (ref 135–145)
Total Bilirubin: 0.4 mg/dL (ref 0.0–1.2)
Total Protein: 6.3 g/dL — ABNORMAL LOW (ref 6.5–8.1)

## 2023-09-26 LAB — WET PREP, GENITAL
Clue Cells Wet Prep HPF POC: NONE SEEN
Sperm: NONE SEEN
Trich, Wet Prep: NONE SEEN
WBC, Wet Prep HPF POC: <10 (ref <10)
Yeast Wet Prep HPF POC: NONE SEEN

## 2023-09-26 MED ORDER — CYCLOBENZAPRINE HCL 5 MG PO TABS
10.0000 mg | ORAL_TABLET | Freq: Once | ORAL | Status: AC
  Administered 2023-09-26: 10 mg via ORAL
  Filled 2023-09-26: qty 2

## 2023-09-26 MED ORDER — METRONIDAZOLE 500 MG PO TABS
500.0000 mg | ORAL_TABLET | Freq: Two times a day (BID) | ORAL | 0 refills | Status: DC
Start: 2023-09-26 — End: 2023-10-03

## 2023-09-26 MED ORDER — LACTATED RINGERS IV BOLUS
1000.0000 mL | Freq: Once | INTRAVENOUS | Status: AC
  Administered 2023-09-26: 1000 mL via INTRAVENOUS

## 2023-09-26 NOTE — ED Notes (Signed)
 Pt asked for crackers and water and it was provided and she is tolerating well.

## 2023-09-26 NOTE — MAU Provider Note (Addendum)
 History     CSN: [id number]  Arrival date and time: 09/26/23 1326   Event Date/Time   First Provider Initiated Contact with Patient 09/26/2023  2:06 PM   Chief Complaint  Patient presents with   Vomiting    HPI  Pamela Mccarty is a 28 y.o. [phone] at [redacted]w[redacted]d who presents to the MAU for abdominal pain, nausea, and vomiting. She initially thought she was having gastroenteritis because her daughter had similar symptoms. Was having diarrhea, nausea, vomiting. She reports her abdominal pain feels worse than her prior preterm labor pains. She states in prior pregnancy, she was being evaluated for PTL, then had PPROM and delivered quickly after. She is worried that may be the case again w this episode of abdominal pain. No urinary sxs, VB, LOF. Ctx feeling stronger and closer together. Normal FM.  Past Medical History:  Diagnosis Date   ADHD (attention deficit hyperactivity disorder)    Anxiety    Asthma    inhaler as a child   Depression    Epilepsy (HCC)    Headache(784.0)    Obesity    Seizures (HCC)    Seizures (HCC) 02/02/2021   per pt; 2/2 post partum blood loss    Past Surgical History:  Procedure Laterality Date   ADENOIDECTOMY     ANKLE SURGERY     ANKLE SURGERY  at 28yo   Pt. reports to lengthen her tendons   TONSILLECTOMY      Family History  Problem Relation Age of Onset   Heart disease Maternal Grandfather    Heart attack Maternal Grandfather    Hypertension Father    Stroke Father    Seizures Father    Bipolar disorder Mother    Cancer Mother    Diabetes Mother    Hypertension Mother    Seizures Mother    Seizures Brother     Social History   Tobacco Use   Smoking status: Some Days    Current packs/day: 0.25    Types: Cigarettes   Smokeless tobacco: Never   Tobacco comments:    Family does not know  Vaping Use   Vaping status: Never Used  Substance Use Topics   Alcohol use: No   Drug use: Not Currently    Types: Marijuana    Comment:  last used october 2024    Allergies:  Allergies  Allergen Reactions   Peanut-Containing Drug Products Anaphylaxis    Stomach bubbles with Peanut oil only  (Patient states that she is not allergic to this)   Latex Other (See Comments)    Redness at site   Soy Allergy (Obsolete) Hives   Tomato     Throat swelling, shortness of breath     Medications Prior to Admission  Medication Sig Dispense Refill Last Dose/Taking   acetaminophen  (TYLENOL ) 500 MG tablet Take 500 mg by mouth every 4 (four) hours as needed for moderate pain (pain score 4-6) or fever.   Past Week   cyclobenzaprine  (FLEXERIL ) 10 MG tablet Take 1 tablet (10 mg total) by mouth 2 (two) times daily as needed for muscle spasms. 20 tablet 0 Past Week   Doxylamine -Pyridoxine  (DICLEGIS ) 10-10 MG TBEC Take 2 qhs; may also take one in am and one in afternoon prn nausea 120 tablet 6 09/25/2023 Morning   ondansetron  (ZOFRAN ) 4 MG tablet Take 1 tablet (4 mg total) by mouth every 6 (six) hours. 30 tablet 2 09/25/2023 Morning   prenatal vitamin w/FE, FA (PRENATAL 1 + 1)  27-1 MG TABS tablet Take 1 tablet by mouth daily at 12 noon. 90 tablet 3 09/26/2023   promethazine  (PHENERGAN ) 25 MG tablet Take 1 tablet (25 mg total) by mouth every 6 (six) hours as needed for nausea or vomiting. 30 tablet 1 09/25/2023 Morning   sertraline  (ZOLOFT ) 50 MG tablet Take 1 tablet (50 mg total) by mouth daily. 30 tablet 3 09/25/2023 Morning   Blood Pressure Monitor MISC For regular home bp monitoring during pregnancy 1 each 0    nitrofurantoin , macrocrystal-monohydrate, (MACROBID ) 100 MG capsule Take 1 capsule (100 mg total) by mouth 2 (two) times daily. X 7 days (Patient not taking: Reported on 09/18/2023) 14 capsule 0     ROS reviewed and pertinent positives and negatives as documented in HPI.  Physical Exam   Blood pressure 134/83, pulse 100, temperature 98.1 F (36.7 C), resp. rate 16, weight 101.6 kg, last menstrual period 01/07/2023, SpO2  100%.  Physical Exam Exam conducted with a chaperone present.  Constitutional:      General: She is not in acute distress.    Appearance: Normal appearance. She is not ill-appearing.  HENT:     Head: Normocephalic and atraumatic.   Cardiovascular:     Rate and Rhythm: Normal rate.  Pulmonary:     Effort: Pulmonary effort is normal.     Breath sounds: Normal breath sounds.  Abdominal:     Palpations: Abdomen is soft.     Tenderness: There is no abdominal tenderness. There is no guarding.  Genitourinary:    Comments: Cervix thick and long  Musculoskeletal:        General: Normal range of motion.   Skin:    General: Skin is warm and dry.     Findings: No rash.   Neurological:     General: No focal deficit present.     Mental Status: She is alert and oriented to person, place, and time.   EFM: 135/mod/+a/-d Toco: every 4-5 min  MAU Course  Procedures  MDM 27 y.o. [id number] at [redacted]w[redacted]d presenting for contractions in setting of [redacted]w[redacted]d with hx PPROM and delivery at 31 weeks. Ctx appear to be regular, patient visibly uncomfortable. Presented to [facility] initially, CBC, CMP, UA neg/wnl there. EFM cat I. Cx thick/long, had vaginal discharge c/f BV. Wet prep negative. Ctx appear to be spacing out after IV fluid bolus and Flexeril . Will recheck cx at 2 hour mark. Care turned over Debbe Fail, NP.  Melanie Spires, MD OB Fellow, Faculty Practice [facility], Center for [facility]  09/26/23 9:54 PM   I resumed care of this patient at 2126 from Dr Hubert Madden -  Cervical exam repeated Closed/Thick ( No Change from previous exam) Plan to discharge home with RX for BV and return  precautions    Assessment and Plan     ICD-10-CM   1. Abdominal pain during pregnancy in third trimester  O26.893    R10.9     2. BV (bacterial vaginosis)  N76.0    B96.89       Patient discharged from the MAU in Stable condition with return precautions  Follow up with your OB provider  See AVS for  full description of verbal and written instructions provided to the patient at time of discharge    Allergies as of 09/26/2023       Reactions   Peanut-containing Drug Products Anaphylaxis   Stomach bubbles with Peanut oil only (Patient states that she is not allergic to this)   Latex  Other (See Comments)   Redness at site   Soy Allergy (obsolete) Hives   Tomato    Throat swelling, shortness of breath        Medication List     TAKE these medications    acetaminophen  500 MG tablet Commonly known as: TYLENOL  Take 500 mg by mouth every 4 (four) hours as needed for moderate pain (pain score 4-6) or fever.   Blood Pressure Monitor Misc For regular home bp monitoring during pregnancy   cyclobenzaprine  10 MG tablet Commonly known as: FLEXERIL  Take 1 tablet (10 mg total) by mouth 2 (two) times daily as needed for muscle spasms.   Doxylamine -Pyridoxine  10-10 MG Tbec Commonly known as: Diclegis  Take 2 qhs; may also take one in am and one in afternoon prn nausea   metroNIDAZOLE  500 MG tablet Commonly known as: FLAGYL  Take 1 tablet (500 mg total) by mouth 2 (two) times daily.   nitrofurantoin  (macrocrystal-monohydrate) 100 MG capsule Commonly known as: MACROBID  Take 1 capsule (100 mg total) by mouth 2 (two) times daily. X 7 days   ondansetron  4 MG tablet Commonly known as: ZOFRAN  Take 1 tablet (4 mg total) by mouth every 6 (six) hours.   prenatal vitamin w/FE, FA 27-1 MG Tabs tablet Take 1 tablet by mouth daily at 12 noon.   promethazine  25 MG tablet Commonly known as: PHENERGAN  Take 1 tablet (25 mg total) by mouth every 6 (six) hours as needed for nausea or vomiting.   sertraline  50 MG tablet Commonly known as: ZOLOFT  Take 1 tablet (50 mg total) by mouth daily.        Debbe Fail, MSN, [facility], Center for [employer]

## 2023-09-26 NOTE — Progress Notes (Signed)
 [facility] called and told about pt who is G4P1 at 35.[redacted] weeks pregnant who presented to [facility] with complains of N/V. She says that she is beginning to feel some UCs as well.  Pts daughter had stomach virus earlier in the week.  Pt reports some spotting this morning but denies LOF.  Due to OBIX upgrade, rrob is unable to remotely monitor fhr.  Cristine Done reports that fhr has been 140s to 160s.  IT and OBIX support both called, but unable to find a resolution at this time.  Dr Alto Atta made aware and gives order to transport pt to MAU.  Cristine Done and MAU charge nurse made aware.  Cristine Done asked to run fhr tracing and send with pt.

## 2023-09-26 NOTE — ED Triage Notes (Addendum)
 Mother reports vomiting and diarrhea today, toddler in home has had the same x 5 days.  Pt is [redacted] weeks pregnant.

## 2023-09-26 NOTE — ED Provider Notes (Signed)
 [place] EMERGENCY DEPARTMENT AT [facility] Provider Note   CSN: [id number] Arrival date & time: 09/26/23  1326     Patient presents with: Vomiting   Pamela Mccarty is a 28 y.o. female.   HPI     Pamela Mccarty is a 28 y.o. female G2, P1 at [redacted] weeks pregnant who presents to the Emergency Department complaining of nausea vomiting diarrhea and lower abdominal pain that began last night around 9:30 PM.  She states that her lower abdominal pain vomiting and diarrhea all began at the same time.  She is here with her daughter who is also having nausea vomiting diarrhea.  The daughter was seen by her pediatrician 5 days ago and told she had a viral process.  Mother describes constant discomfort of her lower abdomen that is non radiating.  No flank pain or dysuria symptoms fever or chills.  States she had some mild spotting yesterday but none today.  States that she called her OB office and was told to come to the ER for further evaluation.  Prior to Admission medications   Medication Sig Start Date End Date Taking? Authorizing Provider  acetaminophen  (TYLENOL ) 500 MG tablet Take 500 mg by mouth every 4 (four) hours as needed for moderate pain (pain score 4-6) or fever.    [provider]  Blood Pressure Monitor MISC For regular home bp monitoring during pregnancy 05/15/23   Ferd Householder, CNM  cyclobenzaprine  (FLEXERIL ) 10 MG tablet Take 1 tablet (10 mg total) by mouth 2 (two) times daily as needed for muscle spasms. Patient not taking: Reported on 09/18/2023 09/11/23   Cresenzo, John V, MD  Doxylamine -Pyridoxine  (DICLEGIS ) 10-10 MG TBEC Take 2 qhs; may also take one in am and one in afternoon prn nausea Patient not taking: Reported on 09/18/2023 05/08/23   Cresenzo-Dishmon, Blanca Bunch, CNM  nitrofurantoin , macrocrystal-monohydrate, (MACROBID ) 100 MG capsule Take 1 capsule (100 mg total) by mouth 2 (two) times daily. X 7 days Patient not taking: Reported on  09/18/2023 08/21/23   Ferd Householder, CNM  ondansetron  (ZOFRAN ) 4 MG tablet Take 1 tablet (4 mg total) by mouth every 6 (six) hours. 05/08/23   Cresenzo-Dishmon, Blanca Bunch, CNM  prenatal vitamin w/FE, FA (PRENATAL 1 + 1) 27-1 MG TABS tablet Take 1 tablet by mouth daily at 12 noon. 05/15/23   Ferd Householder, CNM  promethazine  (PHENERGAN ) 25 MG tablet Take 1 tablet (25 mg total) by mouth every 6 (six) hours as needed for nausea or vomiting. 06/13/23   Ozan, Jennifer, DO  sertraline  (ZOLOFT ) 50 MG tablet Take 1 tablet (50 mg total) by mouth daily. 06/30/23   Grooms, Woodruff, PA-C    Allergies: Peanut-containing drug products, Latex, Soy allergy (obsolete), and Tomato    Review of Systems  Constitutional:  Positive for appetite change. Negative for chills and fever.  HENT:  Negative for sore throat and trouble swallowing.   Respiratory:  Negative for shortness of breath.   Cardiovascular:  Negative for chest pain.  Gastrointestinal:  Positive for abdominal pain, diarrhea, nausea and vomiting. Negative for constipation.  Genitourinary:  Negative for dysuria.  Musculoskeletal:  Negative for back pain and neck pain.  Neurological:  Negative for weakness and numbness.    Updated Vital Signs BP 118/70 (BP Location: Right Arm)   Pulse 89   Temp 98.1 F (36.7 C)   Resp 16   Wt 101.6 kg   LMP 01/07/2023   SpO2 99%   BMI 37.28 kg/m  Physical Exam Vitals and nursing note reviewed.  Constitutional:      General: She is not in acute distress.    Appearance: Normal appearance. She is not ill-appearing or toxic-appearing.  HENT:     Mouth/Throat:     Mouth: Mucous membranes are moist.   Cardiovascular:     Rate and Rhythm: Normal rate and regular rhythm.     Pulses: Normal pulses.  Pulmonary:     Effort: Pulmonary effort is normal.  Abdominal:     Palpations: Abdomen is soft.     Tenderness: There is abdominal tenderness.     Comments: Abdomen is gravid, ttp of lower abdomen.      Musculoskeletal:     Right lower leg: No edema.     Left lower leg: No edema.   Skin:    General: Skin is warm.     Capillary Refill: Capillary refill takes less than 2 seconds.   Neurological:     General: No focal deficit present.     Mental Status: She is alert.     Sensory: No sensory deficit.     Motor: No weakness.     (all labs ordered are listed, but only abnormal results are displayed) Labs Reviewed  COMPREHENSIVE METABOLIC PANEL WITH GFR - Abnormal; Notable for the following components:      Result Value   CO2 21 (*)    Total Protein 6.3 (*)    Albumin 2.8 (*)    AST 12 (*)    All other components within normal limits  CBC WITH DIFFERENTIAL/PLATELET  URINALYSIS, ROUTINE W REFLEX MICROSCOPIC    EKG: None  Radiology: No results found.   Procedures   Medications Ordered in the ED - No data to display                                  Medical Decision Making Patient [redacted] weeks pregnant, G2P1 followed by [facility].  Here for nausea vomiting diarrhea and developed lower abdominal pain.  Symptoms all began last evening at the same time.  She endorses multiple episodes of vomiting and loose nonbloody stools.  Her 54-year-old child is also here with similar symptoms was seen by pediatrician earlier this week diagnosed with viral process.  Patient denies any dysuria flank pain or fever.  States that she had some small scant vaginal bleeding last evening but none today.  She is well-appearing on my exam, vital signs are reassuring.  No peripheral edema.  Mild diffuse lower abdominal tenderness to palpation.  I suspect her symptoms are related to likely gastroenteritis, but given that she is [redacted] weeks pregnant with abd pain, this could also represent a pregnancy related issue  On review of medical records, patient had ultrasound on 08/26/2023 showing IUP at 30 weeks  Amount and/or Complexity of Data Reviewed Labs: ordered.    Details: Labs show no evidence of  leukocytosis, hemoglobin reassuring.  Urinalysis without evidence of proteinuria or infection.  Chemistries without significant derangement. Discussion of management or test interpretation with external provider(s): Patient tolerating crackers and water without difficulty.  No vomiting here.  FHTs noted by nursing staff to be in the 150s.  Patient was placed on continuous fetal monitoring, but I was informed by nursing staff that our monitoring device is not functioning properly at this time.  Consulted OB, Dr. Sari Cunning who recommended patient be transferred down to the MAU for preterm labor  evaluation.  Risk Decision regarding hospitalization.        Final diagnoses:  Abdominal pain during pregnancy in third trimester    ED Discharge Orders     None          Catherne Clubs, PA-C 09/26/23 1704    Karlyn Overman, MD 09/26/23 2145

## 2023-09-28 ENCOUNTER — Other Ambulatory Visit: Payer: Self-pay

## 2023-09-28 ENCOUNTER — Encounter (HOSPITAL_COMMUNITY): Payer: Self-pay | Admitting: Obstetrics and Gynecology

## 2023-09-28 ENCOUNTER — Inpatient Hospital Stay (HOSPITAL_COMMUNITY)
Admission: AD | Admit: 2023-09-28 | Discharge: 2023-09-28 | Disposition: A | Discharge status: Home / Self Care | Attending: Obstetrics and Gynecology | Admitting: Obstetrics and Gynecology

## 2023-09-28 DIAGNOSIS — F17210 Nicotine dependence, cigarettes, uncomplicated: Secondary | ICD-10-CM | POA: Insufficient documentation

## 2023-09-28 DIAGNOSIS — O99333 Smoking (tobacco) complicating pregnancy, third trimester: Secondary | ICD-10-CM | POA: Insufficient documentation

## 2023-09-28 DIAGNOSIS — F909 Attention-deficit hyperactivity disorder, unspecified type: Secondary | ICD-10-CM | POA: Insufficient documentation

## 2023-09-28 DIAGNOSIS — O26893 Other specified pregnancy related conditions, third trimester: Secondary | ICD-10-CM | POA: Diagnosis not present

## 2023-09-28 DIAGNOSIS — O99820 Streptococcus B carrier state complicating pregnancy: Secondary | ICD-10-CM | POA: Diagnosis not present

## 2023-09-28 DIAGNOSIS — R109 Unspecified abdominal pain: Secondary | ICD-10-CM | POA: Diagnosis not present

## 2023-09-28 DIAGNOSIS — Z3A35 35 weeks gestation of pregnancy: Secondary | ICD-10-CM

## 2023-09-28 DIAGNOSIS — R102 Pelvic and perineal pain: Secondary | ICD-10-CM | POA: Diagnosis not present

## 2023-09-28 DIAGNOSIS — F329 Major depressive disorder, single episode, unspecified: Secondary | ICD-10-CM | POA: Diagnosis not present

## 2023-09-28 DIAGNOSIS — O99343 Other mental disorders complicating pregnancy, third trimester: Secondary | ICD-10-CM | POA: Insufficient documentation

## 2023-09-28 DIAGNOSIS — O4703 False labor before 37 completed weeks of gestation, third trimester: Secondary | ICD-10-CM | POA: Insufficient documentation

## 2023-09-28 DIAGNOSIS — O09293 Supervision of pregnancy with other poor reproductive or obstetric history, third trimester: Secondary | ICD-10-CM | POA: Diagnosis not present

## 2023-09-28 DIAGNOSIS — Z0371 Encounter for suspected problem with amniotic cavity and membrane ruled out: Secondary | ICD-10-CM | POA: Diagnosis present

## 2023-09-28 DIAGNOSIS — Z113 Encounter for screening for infections with a predominantly sexual mode of transmission: Secondary | ICD-10-CM | POA: Diagnosis present

## 2023-09-28 LAB — CBC
HCT: 41.8 % (ref 36.0–46.0)
Hemoglobin: 14.6 g/dL (ref 12.0–15.0)
MCH: 30.8 pg (ref 26.0–34.0)
MCHC: 34.9 g/dL (ref 30.0–36.0)
MCV: 88.2 fL (ref 80.0–100.0)
Platelets: 226 10*3/uL (ref 150–400)
RBC: 4.74 MIL/uL (ref 3.87–5.11)
RDW: 12.8 % (ref 11.5–15.5)
WBC: 6.1 10*3/uL (ref 4.0–10.5)
nRBC: 0 % (ref 0.0–0.2)

## 2023-09-28 LAB — URINALYSIS, ROUTINE W REFLEX MICROSCOPIC
Bilirubin Urine: NEGATIVE
Glucose, UA: NEGATIVE mg/dL
Hgb urine dipstick: NEGATIVE
Ketones, ur: NEGATIVE mg/dL
Leukocytes,Ua: NEGATIVE
Nitrite: NEGATIVE
Protein, ur: NEGATIVE mg/dL
Specific Gravity, Urine: 1.024 (ref 1.005–1.030)
pH: 5 (ref 5.0–8.0)

## 2023-09-28 LAB — WET PREP, GENITAL
Clue Cells Wet Prep HPF POC: NONE SEEN
Sperm: NONE SEEN
Trich, Wet Prep: NONE SEEN
WBC, Wet Prep HPF POC: <10 (ref <10)
Yeast Wet Prep HPF POC: NONE SEEN

## 2023-09-28 LAB — RUPTURE OF MEMBRANE (ROM)PLUS: Rom Plus: NEGATIVE

## 2023-09-28 LAB — POCT FERN TEST: POCT Fern Test: NEGATIVE

## 2023-09-28 MED ORDER — LACTATED RINGERS IV BOLUS
1000.0000 mL | Freq: Once | INTRAVENOUS | Status: AC
  Administered 2023-09-28: 1000 mL via INTRAVENOUS

## 2023-09-28 NOTE — MAU Note (Signed)
 Pamela Mccarty is a 28 y.o. at [redacted]w[redacted]d here in MAU reporting: ctx started getting regular at 1230. 3-4 minutes apart. Possible LOF, no VB, adequate FM.    Onset of complaint: 1230 Pain score: 8/10 Vitals:   09/28/23 1350  BP: 132/74  Pulse: (!) 115  Resp: 18  Temp: 99 F (37.2 C)  SpO2: 100%     FHT: 150  Lab orders placed from triage: none

## 2023-09-28 NOTE — MAU Provider Note (Signed)
 Chief Complaint:  Contractions   HPI   None     Pamela Mccarty is a 28 y.o. [phone] at [redacted]w[redacted]d who presents to maternity admissions reporting preterm labor, EDC of 10/29/2023. She endorses contractions every 4 minutes apart since yesterday.  She was in the MAU for contractions on 09/26/2023, at that time contractions were 6-7 minutes apart and her cervix was dilated 1 cm.  She reports that over the past day, contractions have become closer together.  She developed a pelvic pressure and feels that she needs to push.  In the car on the way to the MAU today, she thinks that her water may have broken.  She experienced a gush of fluid that soaked through her pants.  Denies vaginal bleeding, endorses good fetal movement. Risk factors for preterm birth include history of preterm birth.  Additional history from partner.  Pregnancy Course: Receives care at [facility]. Prenatal records reviewed.  Pregnancy complicated by GBS bacteriuria, seizure disorder, ADHD, MDD.  Past Medical History:  Diagnosis Date   ADHD (attention deficit hyperactivity disorder)    Anxiety    Asthma    inhaler as a child   Depression    Epilepsy (HCC)    Headache(784.0)    Obesity    Seizures (HCC)    Seizures (HCC) 02/02/2021   per pt; 2/2 post partum blood loss   OB History  Gravida Para Term Preterm AB Living  4 1  1 2 1   SAB IAB Ectopic Multiple Live Births  2   0 1    # Outcome Date GA Lbr Len/2nd Weight Sex Type Anes PTL Lv  4 Current           3 Preterm 01/30/21 [redacted]w[redacted]d 28:45 / 00:06 1570 g F Vag-Spont EPI  LIV     Complications: Preterm premature rupture of membranes  2 SAB           1 SAB            Past Surgical History:  Procedure Laterality Date   ADENOIDECTOMY     ANKLE SURGERY     ANKLE SURGERY  at 28yo   Pt. reports to lengthen her tendons   TONSILLECTOMY     Family History  Problem Relation Age of Onset   Heart disease Maternal Grandfather    Heart attack Maternal Grandfather     Hypertension Father    Stroke Father    Seizures Father    Bipolar disorder Mother    Cancer Mother    Diabetes Mother    Hypertension Mother    Seizures Mother    Seizures Brother    Social History   Tobacco Use   Smoking status: Some Days    Current packs/day: 0.25    Types: Cigarettes   Smokeless tobacco: Never   Tobacco comments:    Family does not know  Vaping Use   Vaping status: Never Used  Substance Use Topics   Alcohol use: No   Drug use: Not Currently    Types: Marijuana    Comment: last used october 2024   Allergies  Allergen Reactions   Peanut-Containing Drug Products Anaphylaxis    Stomach bubbles with Peanut oil only  (Patient states that she is not allergic to this)   Latex Other (See Comments)    Redness at site   Soy Allergy (Obsolete) Hives   Tomato     Throat swelling, shortness of breath    Medications Prior to Admission  Medication Sig Dispense Refill Last Dose/Taking   cyclobenzaprine  (FLEXERIL ) 10 MG tablet Take 1 tablet (10 mg total) by mouth 2 (two) times daily as needed for muscle spasms. 20 tablet 0 09/28/2023   ondansetron  (ZOFRAN ) 4 MG tablet Take 1 tablet (4 mg total) by mouth every 6 (six) hours. 30 tablet 2 09/28/2023   prenatal vitamin w/FE, FA (PRENATAL 1 + 1) 27-1 MG TABS tablet Take 1 tablet by mouth daily at 12 noon. 90 tablet 3 09/28/2023   promethazine  (PHENERGAN ) 25 MG tablet Take 1 tablet (25 mg total) by mouth every 6 (six) hours as needed for nausea or vomiting. 30 tablet 1 09/28/2023   sertraline  (ZOLOFT ) 50 MG tablet Take 1 tablet (50 mg total) by mouth daily. 30 tablet 3 09/28/2023   acetaminophen  (TYLENOL ) 500 MG tablet Take 500 mg by mouth every 4 (four) hours as needed for moderate pain (pain score 4-6) or fever.      Blood Pressure Monitor MISC For regular home bp monitoring during pregnancy 1 each 0    Doxylamine -Pyridoxine  (DICLEGIS ) 10-10 MG TBEC Take 2 qhs; may also take one in am and one in afternoon prn nausea 120  tablet 6    metroNIDAZOLE  (FLAGYL ) 500 MG tablet Take 1 tablet (500 mg total) by mouth 2 (two) times daily. 14 tablet 0     I have reviewed patient's Past Medical Hx, Surgical Hx, Family Hx, Social Hx, medications and allergies.   ROS  Pertinent items noted in HPI and remainder of comprehensive ROS otherwise negative.   PHYSICAL EXAM  Patient Vitals for the past 24 hrs:  BP Temp Temp src Pulse Resp SpO2 Height Weight  09/28/23 1631 114/65 -- -- (!) 102 18 -- -- --  09/28/23 1350 132/74 99 F (37.2 C) Oral (!) 115 18 100 % 5' 8 (1.727 m) 101.6 kg    Constitutional: Well-developed, well-nourished female in no acute distress. Uncomfortable and engaged in breathing exercises during contractions. HEENT: atraumatic, normocephalic. Neck has normal ROM. EOM intact. Cardiovascular: normal rate & rhythm, warm and well-perfused Respiratory: normal effort, no problems with respiration noted. CTA bilaterally. GI: Abd soft, non-tender, non-distended. Soft. MSK: Extremities nontender, no edema, normal ROM Skin: warm and dry. Acyanotic, no jaundice or pallor. Neurologic: Alert and oriented x 4. No abnormal coordination. Psychiatric: Normal mood. Speech not slurred, not rapid/pressured. Patient is cooperative. GU: no CVA tenderness Pelvic exam: VULVA: normal appearing vulva with no masses, tenderness or lesions, VAGINA: normal appearing vagina with normal color and discharge, no lesions, CERVIX: cervical discharge present - white and thick, exam chaperoned by Nat Mule.  Initial Cervical Exam 1411 Dilation: 2.5 Effacement (%): 40 Station: -3 Exam by:: Joesph Sear, PA-C Repeat Cervical Exam 1517 Dilation: 2.5 Effacement (%): 40 Station: -3 Exam by:: Joesph Sear, PA-C Repeat Cervical Exam 1625 Dilation: 2.5 Effacement (%): 40 Station: -3 Exam by:: Joesph Sear, PA-C  Fetal Tracing: Baseline FHR: 135 per minute Fetal heart variability: moderate Fetal Heart Rate  accelerations: yes Fetal Heart Rate decelerations: none Fetal Non-stress Test: reactive Toco: Uterine irritability, no clear regular uterine contractions.   Labs: Results for orders placed or performed during the hospital encounter of 09/28/23 (from the past 24 hours)  Fern Test     Status: None   Collection Time: 09/28/23  2:36 PM  Result Value Ref Range   POCT Fern Test Negative = intact amniotic membranes   Wet prep, genital     Status: None   Collection Time: 09/28/23  2:44  PM  Result Value Ref Range   Yeast Wet Prep HPF POC NONE SEEN NONE SEEN   Trich, Wet Prep NONE SEEN NONE SEEN   Clue Cells Wet Prep HPF POC NONE SEEN NONE SEEN   WBC, Wet Prep HPF POC <10 <10   Sperm NONE SEEN   Rupture of Membrane (ROM) Plus     Status: None   Collection Time: 09/28/23  2:44 PM  Result Value Ref Range   Rom Plus NEGATIVE   Urinalysis, Routine w reflex microscopic -Urine, Clean Catch     Status: Abnormal   Collection Time: 09/28/23  2:44 PM  Result Value Ref Range   Color, Urine AMBER (A) YELLOW   APPearance HAZY (A) CLEAR   Specific Gravity, Urine 1.024 1.005 - 1.030   pH 5.0 5.0 - 8.0   Glucose, UA NEGATIVE NEGATIVE mg/dL   Hgb urine dipstick NEGATIVE NEGATIVE   Bilirubin Urine NEGATIVE NEGATIVE   Ketones, ur NEGATIVE NEGATIVE mg/dL   Protein, ur NEGATIVE NEGATIVE mg/dL   Nitrite NEGATIVE NEGATIVE   Leukocytes,Ua NEGATIVE NEGATIVE  CBC     Status: None   Collection Time: 09/28/23  2:50 PM  Result Value Ref Range   WBC 6.1 4.0 - 10.5 K/uL   RBC 4.74 3.87 - 5.11 MIL/uL   Hemoglobin 14.6 12.0 - 15.0 g/dL   HCT 58.1 63.9 - 53.9 %   MCV 88.2 80.0 - 100.0 fL   MCH 30.8 26.0 - 34.0 pg   MCHC 34.9 30.0 - 36.0 g/dL   RDW 87.1 88.4 - 84.4 %   Platelets 226 150 - 400 K/uL   nRBC 0.0 0.0 - 0.2 %    Imaging:  No results found.  MDM & MAU COURSE  MDM: High  MAU Course: -Vital signs within normal limits.  -Fern and ROM Plus for possible rupture of membranes. -UA, wet prep, GC  for infection. -Discussed with Dr. Nicholaus. Patient has history of preterm labor at 31 weeks, due to NICU being full recommend transfer to another hospital for delivery if in labor. -On recheck after 1 hour, no cervical change. Will recheck again in 1 hour around 1615. If no cervical change, discharge home with precautions. Dr. Nicholaus provided education regarding definition of labor, reasons for admission, all questions answered. -UA negative for infection but very dark. -Wet prep negative for infection. -Fern and ROM Plus negative. With negative test results and no pooling, doubt rupture of membranes. -On second recheck, still no cervical change. Discussed with Dr. Nicholaus, agrees with discharge home.   Orders Placed This Encounter  Procedures   Wet prep, genital   Culture, beta strep (group b only)   CBC   Rupture of Membrane (ROM) Plus   Urinalysis, Routine w reflex microscopic -Urine, Clean Catch   Fern Test   Discharge patient   Meds ordered this encounter  Medications   lactated ringers  bolus 1,000 mL    ASSESSMENT   1. Abdominal pain during pregnancy in third trimester   2. Threatened preterm labor, third trimester   3. [redacted] weeks gestation of pregnancy     PLAN  Discharge home in stable condition with preterm labor precautions.  Encouraged adequate hydration with at least 96 ounces of water daily.  Provided information on comfort measures like prenatal yoga, maternity support belt.   Follow-up Information     [facility] for [facility] Follow up.   Specialty: Obstetrics and Gynecology Why: As scheduled for ongoing prenatal  care Contact information: [address] [place]  [address] [phone]                 Allergies as of 09/28/2023       Reactions   Peanut-containing Drug Products Anaphylaxis   Stomach bubbles with Peanut oil only (Patient states that she is not allergic to this)   Latex Other (See  Comments)   Redness at site   Soy Allergy (obsolete) Hives   Tomato    Throat swelling, shortness of breath        Medication List     TAKE these medications    acetaminophen  500 MG tablet Commonly known as: TYLENOL  Take 500 mg by mouth every 4 (four) hours as needed for moderate pain (pain score 4-6) or fever.   Blood Pressure Monitor Misc For regular home bp monitoring during pregnancy   cyclobenzaprine  10 MG tablet Commonly known as: FLEXERIL  Take 1 tablet (10 mg total) by mouth 2 (two) times daily as needed for muscle spasms.   Doxylamine -Pyridoxine  10-10 MG Tbec Commonly known as: Diclegis  Take 2 qhs; may also take one in am and one in afternoon prn nausea   metroNIDAZOLE  500 MG tablet Commonly known as: FLAGYL  Take 1 tablet (500 mg total) by mouth 2 (two) times daily.   ondansetron  4 MG tablet Commonly known as: ZOFRAN  Take 1 tablet (4 mg total) by mouth every 6 (six) hours.   prenatal vitamin w/FE, FA 27-1 MG Tabs tablet Take 1 tablet by mouth daily at 12 noon.   promethazine  25 MG tablet Commonly known as: PHENERGAN  Take 1 tablet (25 mg total) by mouth every 6 (six) hours as needed for nausea or vomiting.   sertraline  50 MG tablet Commonly known as: ZOLOFT  Take 1 tablet (50 mg total) by mouth daily.        Joesph DELENA Sear, PA

## 2023-09-28 NOTE — Discharge Instructions (Addendum)
 I have included some information about round ligament pain because the things that are helpful for that are often helpful for contractions and cramping as well! Prenatal yoga or movements on [email or web address] can alleviate pain that is caused or worsened by a baby's positioning.  Reasons to return to MAU at [facility]: You begin to have strong, frequent contractions that you cannot walk or talk through that are 5 minutes apart or less, each last 1 minute, these have been going on for 1-2 hours, even after you have rested and drank at least 16 ounces of water.  Your water breaks.  Sometimes it is a big gush of fluid, sometimes it is just a trickle that keeps getting your panties wet or running down your legs You have vaginal bleeding.  It is normal to have a small amount of spotting if your cervix was checked. If you bleed so much that it soaks through a pad in 1 hour, go to the hospital. You don't feel your baby moving like normal. If you think that you baby's movement is decreased, eat a snack and rest on your left side in a quiet room for one hour. If your baby is still not moving like normal, you should call your OB or go to the MAU.   Round Ligament Pain During Pregnancy Round ligament pain is a sharp pain or jabbing feeling often felt in the lower belly or groin area on one or both sides. It is one of the most common complaints during pregnancy and is considered a normal part of pregnancy. It is most often felt during the second trimester.  Here is what you need to know about round ligament pain, including some tips to help you feel better.  Causes of Round Ligament Pain  Several thick ligaments surround and support your womb (uterus) as it grows during pregnancy. One of them is called the round ligament.  The round ligament connects the front part of the womb to your groin, the area where your legs attach to your pelvis. The round ligament normally tightens and  relaxes slowly.  As your baby and womb grow, the round ligament stretches. That makes it more likely to become strained.  Sudden movements can cause the ligament to tighten quickly, like a rubber band snapping. This causes a sudden and quick jabbing feeling.  Symptoms of Round Ligament Pain  Round ligament pain can be concerning and uncomfortable. But it is considered normal as your body changes during pregnancy.  The symptoms of round ligament pain include a sharp, sudden spasm in the belly. It usually affects the right side, but it may happen on both sides. The pain only lasts a few seconds.  Exercise may cause the pain, as will rapid movements such as:  sneezing coughing laughing rolling over in bed standing up too quickly  Comfort Measures:  Soak in a warm tub Tylenol  1000 mg by mouth every 6-8 hrs as needed for pain Slow position changes Laying on the affected side    Massage: Starting at the middle of your pubic bone, trace little circles in a wide U from your pubic bone to your hip bones on both sides.  Then starting just above your pubic bone, press in and down, alternating sides to create a gentle rocking of your uterus back and forth.  Move your hands up the sides of your belly and back down. Do this 3-5 times upon waking and before bed.   Stretches: Get on  hands and knees and alternate arching your back deeply while inhaling, and then rounding your back while exhaling. Modified runners lunge:  - Sit on a chair with half of your bottom on the chair and half off.  - Sit up tall, plant your front foot, and stretch your other foot out behind you.  - Breathe deeply for 5 breaths and then do the other side.   Also chiropractors and massages are safe in pregnancy. [facility] in [place] specializes in pregnancy care. You can visit their website at: [email or web address] to schedule a new patient chiropractic treatment (1 hour) appointment.  Please let us  know if you have any other questions or concerns.  Maternity Support [employer] can purchase on [place] or through [place]. Below is an example.

## 2023-09-29 LAB — GC/CHLAMYDIA PROBE AMP (~~LOC~~) NOT AT ARMC
Chlamydia: NEGATIVE
Comment: NEGATIVE
Comment: NORMAL
Neisseria Gonorrhea: NEGATIVE

## 2023-09-29 LAB — CULTURE, BETA STREP (GROUP B ONLY)

## 2023-10-03 ENCOUNTER — Encounter: Payer: Self-pay | Admitting: Advanced Practice Midwife

## 2023-10-03 ENCOUNTER — Other Ambulatory Visit (HOSPITAL_COMMUNITY)
Admission: RE | Admit: 2023-10-03 | Discharge: 2023-10-03 | Disposition: A | Discharge status: Home / Self Care | Source: Ambulatory Visit | Attending: Advanced Practice Midwife | Admitting: Advanced Practice Midwife

## 2023-10-03 ENCOUNTER — Ambulatory Visit: Admitting: Advanced Practice Midwife

## 2023-10-03 VITALS — BP 137/82 | HR 86 | Wt 224.0 lb

## 2023-10-03 DIAGNOSIS — Z124 Encounter for screening for malignant neoplasm of cervix: Secondary | ICD-10-CM | POA: Insufficient documentation

## 2023-10-03 DIAGNOSIS — Z1389 Encounter for screening for other disorder: Secondary | ICD-10-CM

## 2023-10-03 DIAGNOSIS — Z3A36 36 weeks gestation of pregnancy: Secondary | ICD-10-CM | POA: Diagnosis not present

## 2023-10-03 DIAGNOSIS — Z331 Pregnant state, incidental: Secondary | ICD-10-CM

## 2023-10-03 DIAGNOSIS — Z3483 Encounter for supervision of other normal pregnancy, third trimester: Secondary | ICD-10-CM | POA: Diagnosis not present

## 2023-10-03 DIAGNOSIS — Z3480 Encounter for supervision of other normal pregnancy, unspecified trimester: Secondary | ICD-10-CM

## 2023-10-03 LAB — POCT URINALYSIS DIPSTICK OB
Blood, UA: NEGATIVE
Glucose, UA: NEGATIVE
Ketones, UA: NEGATIVE
Leukocytes, UA: NEGATIVE
Nitrite, UA: NEGATIVE

## 2023-10-03 NOTE — Progress Notes (Signed)
   LOW-RISK PREGNANCY VISIT Patient name: Pamela Mccarty MRN [id number]  Date of birth: 04/05/1996 Chief Complaint:   Routine Prenatal Visit (Having some blurred vision,seeing spot. Need refill Zoloft  and diclegis )  History of Present Illness:   Pamela Mccarty is a 28 y.o. [phone] female at [redacted]w[redacted]d with an Estimated Date of Delivery: 10/29/23 being seen today for ongoing management of a low-risk pregnancy.  Today she reports many late preg discomforts; irreg ctx; LBP; had MAU visit 6/22 for ctx . Contractions: Irregular.  .  Movement: Present. denies leaking of fluid. Review of Systems:   Pertinent items are noted in HPI Denies abnormal vaginal discharge w/ itching/odor/irritation, headaches, visual changes, shortness of breath, chest pain, abdominal pain, severe nausea/vomiting, or problems with urination or bowel movements unless otherwise stated above. Pertinent History Reviewed:  Reviewed past medical,surgical, social, obstetrical and family history.  Reviewed problem list, medications and allergies. Physical Assessment:   Vitals:   10/03/23 1117  BP: 137/82  Pulse: 86  Weight: 224 lb (101.6 kg)  Body mass index is 34.06 kg/m.        Physical Examination:   General appearance: Well appearing, and in no distress  Mental status: Alert, oriented to person, place, and time  Skin: Warm & dry  Cardiovascular: Normal heart rate noted  Respiratory: Normal respiratory effort, no distress  Abdomen: Soft, gravid, nontender  Pelvic: Cervical exam performed  Dilation: Closed (ext os 2cm) Effacement (%): Thick Station: -3  Extremities:    Fetal Status: Fetal Heart Rate (bpm): 151 Fundal Height: 36 cm Movement: Present Presentation: Vertex  Results for orders placed or performed in visit on 10/03/23 (from the past 24 hours)  POC Urinalysis Dipstick OB   Collection Time: 10/03/23 11:43 AM  Result Value Ref Range   Color, UA     Clarity, UA     Glucose, UA Negative Negative    Bilirubin, UA     Ketones, UA negative    Spec Grav, UA     Blood, UA negative    pH, UA     POC,PROTEIN,UA Trace Negative, Trace, Small (1+), Moderate (2+), Large (3+), 4+   Urobilinogen, UA     Nitrite, UA negative    Leukocytes, UA Negative Negative   Appearance     Odor      Assessment & Plan:  1) Low-risk pregnancy [id number] at [redacted]w[redacted]d with an Estimated Date of Delivery: 10/29/23   2) Routine Pap collected today  3) GBS bacteruria Feb 2024, will ppx in labor, despite neg GBS culture 6/22  4) Late preg discomforts  5) Still interested in WB, hasn't taken class yet, info given in case there is still time   Meds: No orders of the defined types were placed in this encounter.  Labs/procedures today: Pap; SVE  Plan:  Continue routine obstetrical care   Reviewed: Term labor symptoms and general obstetric precautions including but not limited to vaginal bleeding, contractions, leaking of fluid and fetal movement were reviewed in detail with the patient.  All questions were answered. Has home bp cuff. Check bp weekly, let us  know if >140/90.   Follow-up: Return for As scheduled.  Orders Placed This Encounter  Procedures   POC Urinalysis Dipstick OB   LOTOYA CASELLA [facility] 10/03/2023 11:52 AM

## 2023-10-07 LAB — CYTOLOGY - PAP: Diagnosis: NEGATIVE

## 2023-10-08 ENCOUNTER — Encounter (HOSPITAL_COMMUNITY): Payer: Self-pay | Admitting: Obstetrics and Gynecology

## 2023-10-08 ENCOUNTER — Other Ambulatory Visit: Payer: Self-pay

## 2023-10-08 ENCOUNTER — Inpatient Hospital Stay (HOSPITAL_COMMUNITY)

## 2023-10-08 ENCOUNTER — Inpatient Hospital Stay (HOSPITAL_COMMUNITY)
Admission: AD | Admit: 2023-10-08 | Discharge: 2023-10-11 | DRG: 807 | Disposition: A | Discharge status: Home / Self Care | Payer: Self-pay | Attending: Family Medicine | Admitting: Family Medicine

## 2023-10-08 ENCOUNTER — Ambulatory Visit: Payer: Self-pay | Admitting: Advanced Practice Midwife

## 2023-10-08 DIAGNOSIS — O99824 Streptococcus B carrier state complicating childbirth: Secondary | ICD-10-CM | POA: Diagnosis not present

## 2023-10-08 DIAGNOSIS — O99214 Obesity complicating childbirth: Secondary | ICD-10-CM | POA: Diagnosis not present

## 2023-10-08 DIAGNOSIS — O99344 Other mental disorders complicating childbirth: Secondary | ICD-10-CM | POA: Diagnosis present

## 2023-10-08 DIAGNOSIS — M25572 Pain in left ankle and joints of left foot: Secondary | ICD-10-CM | POA: Diagnosis not present

## 2023-10-08 DIAGNOSIS — Y92028 Other place in mobile home as the place of occurrence of the external cause: Secondary | ICD-10-CM

## 2023-10-08 DIAGNOSIS — F332 Major depressive disorder, recurrent severe without psychotic features: Secondary | ICD-10-CM

## 2023-10-08 DIAGNOSIS — Z23 Encounter for immunization: Secondary | ICD-10-CM | POA: Diagnosis not present

## 2023-10-08 DIAGNOSIS — O134 Gestational [pregnancy-induced] hypertension without significant proteinuria, complicating childbirth: Secondary | ICD-10-CM | POA: Diagnosis not present

## 2023-10-08 DIAGNOSIS — O326XX0 Maternal care for compound presentation, not applicable or unspecified: Secondary | ICD-10-CM | POA: Diagnosis present

## 2023-10-08 DIAGNOSIS — Z3A37 37 weeks gestation of pregnancy: Secondary | ICD-10-CM

## 2023-10-08 DIAGNOSIS — O99334 Smoking (tobacco) complicating childbirth: Secondary | ICD-10-CM | POA: Diagnosis not present

## 2023-10-08 DIAGNOSIS — O99820 Streptococcus B carrier state complicating pregnancy: Secondary | ICD-10-CM | POA: Diagnosis not present

## 2023-10-08 DIAGNOSIS — Z91040 Latex allergy status: Secondary | ICD-10-CM | POA: Diagnosis not present

## 2023-10-08 DIAGNOSIS — Z79899 Other long term (current) drug therapy: Secondary | ICD-10-CM | POA: Diagnosis not present

## 2023-10-08 DIAGNOSIS — Z8249 Family history of ischemic heart disease and other diseases of the circulatory system: Secondary | ICD-10-CM | POA: Diagnosis not present

## 2023-10-08 DIAGNOSIS — O139 Gestational [pregnancy-induced] hypertension without significant proteinuria, unspecified trimester: Secondary | ICD-10-CM | POA: Diagnosis present

## 2023-10-08 DIAGNOSIS — G40909 Epilepsy, unspecified, not intractable, without status epilepticus: Secondary | ICD-10-CM | POA: Diagnosis present

## 2023-10-08 DIAGNOSIS — F419 Anxiety disorder, unspecified: Secondary | ICD-10-CM | POA: Diagnosis not present

## 2023-10-08 DIAGNOSIS — O133 Gestational [pregnancy-induced] hypertension without significant proteinuria, third trimester: Principal | ICD-10-CM

## 2023-10-08 DIAGNOSIS — Y9389 Activity, other specified: Secondary | ICD-10-CM

## 2023-10-08 DIAGNOSIS — Z833 Family history of diabetes mellitus: Secondary | ICD-10-CM

## 2023-10-08 DIAGNOSIS — W1839XA Other fall on same level, initial encounter: Secondary | ICD-10-CM | POA: Diagnosis not present

## 2023-10-08 DIAGNOSIS — Z743 Need for continuous supervision: Secondary | ICD-10-CM | POA: Diagnosis not present

## 2023-10-08 DIAGNOSIS — O479 False labor, unspecified: Secondary | ICD-10-CM | POA: Diagnosis not present

## 2023-10-08 DIAGNOSIS — F17210 Nicotine dependence, cigarettes, uncomplicated: Secondary | ICD-10-CM | POA: Diagnosis not present

## 2023-10-08 DIAGNOSIS — Q672 Dolichocephaly: Secondary | ICD-10-CM | POA: Diagnosis not present

## 2023-10-08 DIAGNOSIS — I10 Essential (primary) hypertension: Secondary | ICD-10-CM | POA: Diagnosis not present

## 2023-10-08 DIAGNOSIS — M79672 Pain in left foot: Secondary | ICD-10-CM | POA: Diagnosis not present

## 2023-10-08 LAB — COMPREHENSIVE METABOLIC PANEL WITH GFR
ALT: 16 U/L (ref 0–44)
AST: 15 U/L (ref 15–41)
Albumin: 2.6 g/dL — ABNORMAL LOW (ref 3.5–5.0)
Alkaline Phosphatase: 85 U/L (ref 38–126)
Anion gap: 9 (ref 5–15)
BUN: 10 mg/dL (ref 6–20)
CO2: 19 mmol/L — ABNORMAL LOW (ref 22–32)
Calcium: 8.8 mg/dL — ABNORMAL LOW (ref 8.9–10.3)
Chloride: 109 mmol/L (ref 98–111)
Creatinine, Ser: 0.67 mg/dL (ref 0.44–1.00)
GFR, Estimated: >60 mL/min (ref >60)
Glucose, Bld: 70 mg/dL (ref 70–99)
Potassium: 3.8 mmol/L (ref 3.5–5.1)
Sodium: 137 mmol/L (ref 135–145)
Total Bilirubin: 0.5 mg/dL (ref 0.0–1.2)
Total Protein: 6.1 g/dL — ABNORMAL LOW (ref 6.5–8.1)

## 2023-10-08 LAB — CBC
HCT: 45.1 % (ref 36.0–46.0)
Hemoglobin: 15.4 g/dL — ABNORMAL HIGH (ref 12.0–15.0)
MCH: 30.3 pg (ref 26.0–34.0)
MCHC: 34.1 g/dL (ref 30.0–36.0)
MCV: 88.6 fL (ref 80.0–100.0)
Platelets: 247 10*3/uL (ref 150–400)
RBC: 5.09 MIL/uL (ref 3.87–5.11)
RDW: 13.2 % (ref 11.5–15.5)
WBC: 11 10*3/uL — ABNORMAL HIGH (ref 4.0–10.5)
nRBC: 0 % (ref 0.0–0.2)

## 2023-10-08 LAB — URINALYSIS, ROUTINE W REFLEX MICROSCOPIC
Bilirubin Urine: NEGATIVE
Glucose, UA: NEGATIVE mg/dL
Hgb urine dipstick: NEGATIVE
Ketones, ur: NEGATIVE mg/dL
Leukocytes,Ua: NEGATIVE
Nitrite: NEGATIVE
Protein, ur: NEGATIVE mg/dL
Specific Gravity, Urine: 1.015 (ref 1.005–1.030)
pH: 6 (ref 5.0–8.0)

## 2023-10-08 LAB — RUPTURE OF MEMBRANE (ROM)PLUS: Rom Plus: NEGATIVE

## 2023-10-08 LAB — TYPE AND SCREEN
ABO/RH(D): B POS
Antibody Screen: NEGATIVE

## 2023-10-08 LAB — POCT FERN TEST: POCT Fern Test: NEGATIVE

## 2023-10-08 LAB — PROTEIN / CREATININE RATIO, URINE
Creatinine, Urine: 72 mg/dL
Protein Creatinine Ratio: 0.1 mg/mg{creat} (ref 0.00–0.15)
Total Protein, Urine: 7 mg/dL

## 2023-10-08 MED ORDER — ONDANSETRON HCL 4 MG/2ML IJ SOLN: 4.0000 mg | Freq: Four times a day (QID) | INTRAMUSCULAR | Status: DC | PRN

## 2023-10-08 MED ORDER — ACETAMINOPHEN 500 MG PO TABS
1000.0000 mg | ORAL_TABLET | Freq: Once | ORAL | Status: AC
  Administered 2023-10-08: 1000 mg via ORAL
  Filled 2023-10-08: qty 2

## 2023-10-08 MED ORDER — PENICILLIN G POT IN DEXTROSE 60000 UNIT/ML IV SOLN
3.0000 10*6.[IU] | INTRAVENOUS | Status: DC
  Administered 2023-10-08 – 2023-10-09 (×2): 3 10*6.[IU] via INTRAVENOUS
  Filled 2023-10-08 (×2): qty 50

## 2023-10-08 MED ORDER — FENTANYL CITRATE (PF) 100 MCG/2ML IJ SOLN
50.0000 ug | INTRAMUSCULAR | Status: DC | PRN
  Administered 2023-10-08: 50 ug via INTRAVENOUS
  Administered 2023-10-09: 100 ug via INTRAVENOUS
  Administered 2023-10-09: 50 ug via INTRAVENOUS
  Filled 2023-10-08 (×3): qty 2

## 2023-10-08 MED ORDER — TERBUTALINE SULFATE 1 MG/ML IJ SOLN: 0.2500 mg | Freq: Once | INTRAMUSCULAR | Status: DC | PRN

## 2023-10-08 MED ORDER — BUTALBITAL-APAP-CAFFEINE 50-325-40 MG PO TABS
2.0000 | ORAL_TABLET | Freq: Once | ORAL | Status: AC
  Administered 2023-10-08: 2 via ORAL
  Filled 2023-10-08: qty 2

## 2023-10-08 MED ORDER — OXYTOCIN-SODIUM CHLORIDE 30-0.9 UT/500ML-% IV SOLN: 2.5000 [IU]/h | INTRAVENOUS | Status: DC

## 2023-10-08 MED ORDER — SERTRALINE HCL 50 MG PO TABS
50.0000 mg | ORAL_TABLET | Freq: Every day | ORAL | Status: DC
  Administered 2023-10-08 – 2023-10-10 (×3): 50 mg via ORAL
  Filled 2023-10-08 (×3): qty 1

## 2023-10-08 MED ORDER — MISOPROSTOL 50MCG HALF TABLET
50.0000 ug | ORAL_TABLET | Freq: Once | ORAL | Status: AC
Start: 2023-10-08 — End: 2023-10-08
  Administered 2023-10-08: 50 ug via ORAL
  Filled 2023-10-08: qty 1

## 2023-10-08 MED ORDER — MISOPROSTOL 25 MCG QUARTER TABLET
25.0000 ug | ORAL_TABLET | Freq: Once | ORAL | Status: AC
  Administered 2023-10-08: 25 ug via VAGINAL
  Filled 2023-10-08: qty 1

## 2023-10-08 MED ORDER — LACTATED RINGERS IV SOLN: 500.0000 mL | INTRAVENOUS | Status: DC | PRN

## 2023-10-08 MED ORDER — OXYTOCIN BOLUS FROM INFUSION
333.0000 mL | Freq: Once | INTRAVENOUS | Status: AC
  Administered 2023-10-09: 333 mL via INTRAVENOUS

## 2023-10-08 MED ORDER — OXYCODONE-ACETAMINOPHEN 5-325 MG PO TABS: 1.0000 | ORAL_TABLET | ORAL | Status: DC | PRN

## 2023-10-08 MED ORDER — OXYCODONE-ACETAMINOPHEN 5-325 MG PO TABS: 2.0000 | ORAL_TABLET | ORAL | Status: DC | PRN

## 2023-10-08 MED ORDER — SERTRALINE HCL 50 MG PO TABS: 50.0000 mg | ORAL_TABLET | Freq: Every day | ORAL | Status: DC

## 2023-10-08 MED ORDER — SOD CITRATE-CITRIC ACID 500-334 MG/5ML PO SOLN: 30.0000 mL | ORAL | Status: DC | PRN

## 2023-10-08 MED ORDER — OXYTOCIN-SODIUM CHLORIDE 30-0.9 UT/500ML-% IV SOLN
1.0000 m[IU]/min | INTRAVENOUS | Status: DC
  Administered 2023-10-08: 2 m[IU]/min via INTRAVENOUS
  Filled 2023-10-08: qty 500

## 2023-10-08 MED ORDER — LIDOCAINE HCL (PF) 1 % IJ SOLN
30.0000 mL | INTRAMUSCULAR | Status: AC | PRN
  Administered 2023-10-09: 30 mL via SUBCUTANEOUS
  Filled 2023-10-08: qty 30

## 2023-10-08 MED ORDER — TRAZODONE HCL 100 MG PO TABS
100.0000 mg | ORAL_TABLET | Freq: Every evening | ORAL | Status: DC | PRN
  Administered 2023-10-09 – 2023-10-10 (×2): 100 mg via ORAL
  Filled 2023-10-08 (×3): qty 1

## 2023-10-08 MED ORDER — LACTATED RINGERS IV SOLN: INTRAVENOUS | Status: DC

## 2023-10-08 MED ORDER — ACETAMINOPHEN 325 MG PO TABS: 650.0000 mg | ORAL_TABLET | ORAL | Status: DC | PRN

## 2023-10-08 MED ORDER — PENICILLIN G POTASSIUM 5000000 UNITS IJ SOLR
5.0000 10*6.[IU] | Freq: Once | INTRAMUSCULAR | Status: AC
  Administered 2023-10-08: 5 10*6.[IU] via INTRAVENOUS
  Filled 2023-10-08: qty 5

## 2023-10-08 NOTE — Progress Notes (Signed)
 Orthopedic Tech Progress Note Patient Details:  Pamela Mccarty 10/05/1995 [id number] Applied ASO lace up brace per order.  Ortho Devices Type of Ortho Device: ASO Ortho Device/Splint Location: LLE Ortho Device/Splint Interventions: Ordered, Application, Adjustment   Post Interventions Patient Tolerated: Well Instructions Provided: Adjustment of device, Care of device, Poper ambulation with device  Pamela Mccarty 10/08/2023, 2:19 PM

## 2023-10-08 NOTE — MAU Provider Note (Signed)
 Chief Complaint:  Labor Eval   None     HPI: Pamela Mccarty is a 28 y.o. [phone] at [redacted]w[redacted]d who presents to maternity admissions via EMS reporting urge to push and gush of fluid.  EMS reports that when transferring her to the stretcher on her wooden steps/porch that the porch collapsed and the patient fell, landing on her feet, approximately a 2 foot drop.  The patient's husband also fell but neither are reported to have visible injuries.  The patient reports some pain in her left ankle upon arrival in MAU.   She reports good fetal movement, denies LOF, vaginal bleeding, vaginal itching/burning, urinary symptoms, h/a, dizziness, n/v, or fever/chills.    HPI  Past Medical History: Past Medical History:  Diagnosis Date   ADHD (attention deficit hyperactivity disorder)    Anxiety    Asthma    inhaler as a child   Depression    Epilepsy (HCC)    Headache(784.0)    Obesity    Seizures (HCC)    Seizures (HCC) 02/02/2021   per pt; 2/2 post partum blood loss    Past obstetric history: OB History  Gravida Para Term Preterm AB Living  4 1  1 2 1   SAB IAB Ectopic Multiple Live Births  2   0 1    # Outcome Date GA Lbr Len/2nd Weight Sex Type Anes PTL Lv  4 Current           3 Preterm 01/30/21 [redacted]w[redacted]d 28:45 / 00:06 1570 g F Vag-Spont EPI  LIV     Complications: Preterm premature rupture of membranes  2 SAB           1 SAB             Past Surgical History: Past Surgical History:  Procedure Laterality Date   ADENOIDECTOMY     ANKLE SURGERY     ANKLE SURGERY  at 28yo   Pt. reports to lengthen her tendons   TONSILLECTOMY      Family History: Family History  Problem Relation Age of Onset   Anxiety disorder Mother    Depression Mother    Bipolar disorder Mother    Cancer Mother    Diabetes Mother    Hypertension Mother    Seizures Mother    Hypertension Father    Stroke Father    Seizures Father    Seizures Brother    Heart disease Maternal Grandfather    Heart attack  Maternal Grandfather     Social History: Social History   Tobacco Use   Smoking status: Some Days    Types: Cigarettes    Start date: 2025   Smokeless tobacco: Never   Tobacco comments:    Family does not know  Vaping Use   Vaping status: Never Used  Substance Use Topics   Alcohol use: Not Currently    Comment: 4 or 5 years ago   Drug use: Not Currently    Types: Marijuana    Comment: last used october 2024    Allergies:  Allergies  Allergen Reactions   Peanut-Containing Drug Products Anaphylaxis    Stomach bubbles with Peanut oil only  (Patient states that she is not allergic to this)   Latex Other (See Comments)    Redness at site   Soy Allergy (Obsolete) Hives   Tomato     Throat swelling, shortness of breath     Meds:  Medications Prior to Admission  Medication Sig Dispense Refill Last  Dose/Taking   acetaminophen  (TYLENOL ) 500 MG tablet Take 500 mg by mouth every 4 (four) hours as needed for moderate pain (pain score 4-6) or fever.   10/08/2023   cyclobenzaprine  (FLEXERIL ) 10 MG tablet Take 1 tablet (10 mg total) by mouth 2 (two) times daily as needed for muscle spasms. 20 tablet 0 10/07/2023   Doxylamine -Pyridoxine  (DICLEGIS ) 10-10 MG TBEC Take 2 qhs; may also take one in am and one in afternoon prn nausea 120 tablet 6 10/08/2023   ondansetron  (ZOFRAN ) 4 MG tablet Take 1 tablet (4 mg total) by mouth every 6 (six) hours. 30 tablet 2 10/08/2023   prenatal vitamin w/FE, FA (PRENATAL 1 + 1) 27-1 MG TABS tablet Take 1 tablet by mouth daily at 12 noon. 90 tablet 3 10/08/2023   promethazine  (PHENERGAN ) 25 MG tablet Take 1 tablet (25 mg total) by mouth every 6 (six) hours as needed for nausea or vomiting. 30 tablet 1 10/08/2023   sertraline  (ZOLOFT ) 50 MG tablet Take 1 tablet (50 mg total) by mouth daily. 30 tablet 3 10/08/2023   Blood Pressure Monitor MISC For regular home bp monitoring during pregnancy 1 each 0     ROS:  Review of Systems  Constitutional:  Negative for chills,  fatigue and fever.  Eyes:  Negative for visual disturbance.  Respiratory:  Negative for shortness of breath.   Cardiovascular:  Negative for chest pain.  Gastrointestinal:  Positive for abdominal pain. Negative for nausea and vomiting.  Genitourinary:  Positive for vaginal discharge. Negative for difficulty urinating, dysuria, flank pain, pelvic pain, vaginal bleeding and vaginal pain.  Neurological:  Negative for dizziness and headaches.  Psychiatric/Behavioral: Negative.       I have reviewed patient's Past Medical Hx, Surgical Hx, Family Hx, Social Hx, medications and allergies.   Physical Exam  Patient Vitals for the past 24 hrs:  BP Temp Temp src Pulse Resp SpO2  10/08/23 1901 (!) 136/92 -- -- 93 -- --  10/08/23 1813 132/77 -- -- 92 -- --  10/08/23 1650 (!) 141/84 -- -- (!) 101 -- --  10/08/23 1608 (!) 148/94 98.2 F (36.8 C) Oral (!) 119 -- --  10/08/23 1545 -- -- -- -- -- 99 %  10/08/23 1540 -- -- -- -- -- 99 %  10/08/23 1535 -- -- -- -- -- 99 %  10/08/23 1530 -- -- -- -- -- 99 %  10/08/23 1525 -- -- -- -- -- 99 %  10/08/23 1520 -- -- -- -- -- 99 %  10/08/23 1515 -- -- -- -- -- 99 %  10/08/23 1510 -- -- -- -- -- 100 %  10/08/23 1505 -- -- -- -- -- 100 %  10/08/23 1500 -- -- -- -- -- 100 %  10/08/23 1455 -- -- -- -- -- 100 %  10/08/23 1450 -- -- -- -- -- 99 %  10/08/23 1445 -- -- -- -- -- 100 %  10/08/23 1440 -- -- -- -- -- 100 %  10/08/23 1435 -- -- -- -- -- 100 %  10/08/23 1430 -- -- -- -- -- 100 %  10/08/23 1425 -- -- -- -- -- 100 %  10/08/23 1420 -- -- -- -- -- 100 %  10/08/23 1415 -- -- -- -- -- 100 %  10/08/23 1410 -- -- -- -- -- 100 %  10/08/23 1405 -- -- -- -- -- 100 %  10/08/23 1401 (!) 136/93 -- -- (!) 112 -- --  10/08/23 1400 -- -- -- -- --  100 %  10/08/23 1355 -- -- -- -- -- 100 %  10/08/23 1350 -- -- -- -- -- 100 %  10/08/23 1335 -- -- -- -- -- 99 %  10/08/23 1331 (!) 139/96 -- -- 97 -- --  10/08/23 1330 -- -- -- -- -- 100 %  10/08/23 1325 -- -- --  -- -- 100 %  10/08/23 1320 -- -- -- -- -- 100 %  10/08/23 1316 (!) 141/89 -- -- (!) 107 -- --  10/08/23 1315 -- -- -- -- -- 99 %  10/08/23 1310 -- -- -- -- -- 99 %  10/08/23 1305 -- -- -- -- -- 100 %  10/08/23 1301 (!) 148/87 -- -- 96 -- --  10/08/23 1300 -- -- -- -- -- 99 %  10/08/23 1255 -- -- -- -- -- 99 %  10/08/23 1250 -- -- -- -- -- 100 %  10/08/23 1245 -- -- -- -- -- 99 %  10/08/23 1240 -- -- -- -- -- 99 %  10/08/23 1235 -- -- -- -- -- 100 %  10/08/23 1231 (!) 148/92 -- -- 77 -- --  10/08/23 1230 -- -- -- -- -- 99 %  10/08/23 1225 -- -- -- -- -- 100 %  10/08/23 1220 -- -- -- -- -- 99 %  10/08/23 1216 133/87 -- -- (!) 105 -- --  10/08/23 1215 -- -- -- -- -- 99 %  10/08/23 1210 -- -- -- -- -- 99 %  10/08/23 1205 -- -- -- -- -- 99 %  10/08/23 1201 (!) 145/90 -- -- 98 -- --  10/08/23 1200 -- -- -- -- -- 99 %  10/08/23 1155 -- -- -- -- -- 99 %  10/08/23 1151 132/89 -- -- (!) 107 -- --  10/08/23 1150 -- -- -- -- -- 99 %  10/08/23 1139 128/84 97.9 F (36.6 C) -- 98 18 --   Constitutional: Well-developed, well-nourished female in no acute distress.  Cardiovascular: normal rate Respiratory: normal effort GI: Abd soft, non-tender, gravid appropriate for gestational age.  MS: Extremities nontender, no edema, normal ROM Neurologic: Alert and oriented x 4.  GU: Neg CVAT.  PELVIC EXAM: Pooling negative  Ferning negative on slide  Dilation: 2.5 Effacement (%): Thick Cervical Position: Posterior Station: Ballotable Presentation: Vertex Exam by:: Mady Pals, RN  FHT:  Baseline 155 , moderate variability, accelerations present, no decelerations Contractions: q 2-10 mins   Labs: Results for orders placed or performed during the hospital encounter of 10/08/23 (from the past 24 hours)  Fern Test     Status: Normal   Collection Time: 10/08/23 12:35 PM  Result Value Ref Range   POCT Fern Test Negative = intact amniotic membranes   Rupture of Membrane (ROM) Plus      Status: None   Collection Time: 10/08/23 12:36 PM  Result Value Ref Range   Rom Plus NEGATIVE   Protein / creatinine ratio, urine     Status: None   Collection Time: 10/08/23  1:43 PM  Result Value Ref Range   Creatinine, Urine 72 mg/dL   Total Protein, Urine 7 mg/dL   Protein Creatinine Ratio 0.10 0.00 - 0.15 mg/mg[Cre]  Urinalysis, Routine w reflex microscopic -Urine, Clean Catch     Status: Abnormal   Collection Time: 10/08/23  1:46 PM  Result Value Ref Range   Color, Urine YELLOW YELLOW   APPearance HAZY (A) CLEAR   Specific Gravity, Urine 1.015 1.005 - 1.030   pH 6.0 5.0 -  8.0   Glucose, UA NEGATIVE NEGATIVE mg/dL   Hgb urine dipstick NEGATIVE NEGATIVE   Bilirubin Urine NEGATIVE NEGATIVE   Ketones, ur NEGATIVE NEGATIVE mg/dL   Protein, ur NEGATIVE NEGATIVE mg/dL   Nitrite NEGATIVE NEGATIVE   Leukocytes,Ua NEGATIVE NEGATIVE  CBC     Status: Abnormal   Collection Time: 10/08/23  2:00 PM  Result Value Ref Range   WBC 11.0 (H) 4.0 - 10.5 K/uL   RBC 5.09 3.87 - 5.11 MIL/uL   Hemoglobin 15.4 (H) 12.0 - 15.0 g/dL   HCT 54.8 63.9 - 53.9 %   MCV 88.6 80.0 - 100.0 fL   MCH 30.3 26.0 - 34.0 pg   MCHC 34.1 30.0 - 36.0 g/dL   RDW 86.7 88.4 - 84.4 %   Platelets 247 150 - 400 K/uL   nRBC 0.0 0.0 - 0.2 %  Comprehensive metabolic panel with GFR     Status: Abnormal   Collection Time: 10/08/23  2:00 PM  Result Value Ref Range   Sodium 137 135 - 145 mmol/L   Potassium 3.8 3.5 - 5.1 mmol/L   Chloride 109 98 - 111 mmol/L   CO2 19 (L) 22 - 32 mmol/L   Glucose, Bld 70 70 - 99 mg/dL   BUN 10 6 - 20 mg/dL   Creatinine, Ser 9.32 0.44 - 1.00 mg/dL   Calcium  8.8 (L) 8.9 - 10.3 mg/dL   Total Protein 6.1 (L) 6.5 - 8.1 g/dL   Albumin 2.6 (L) 3.5 - 5.0 g/dL   AST 15 15 - 41 U/L   ALT 16 0 - 44 U/L   Alkaline Phosphatase 85 38 - 126 U/L   Total Bilirubin 0.5 0.0 - 1.2 mg/dL   GFR, Estimated >39 >39 mL/min   Anion gap 9 5 - 15  Type and screen [facility]     Status: None    Collection Time: 10/08/23  4:56 PM  Result Value Ref Range   ABO/RH(D) B POS    Antibody Screen NEG    Sample Expiration      10/11/2023,2359 Performed at [facility], [address], [place], [place] [address]    --/--/B POS (07/02 1656)  Imaging:  DG Foot 2 Views Left Result Date: 10/08/2023 CLINICAL DATA:  Fall with pain. EXAM: LEFT FOOT - 2 VIEW COMPARISON:  None Available. FINDINGS: No acute osseous or joint abnormality. IMPRESSION: No acute osseous or joint abnormality. Electronically Signed   By: Newell Eke M.D.   On: 10/08/2023 13:33    MAU Course/MDM:    NST reviewed and reactive No evidence of active labor or PROM Foot x-ray without evidence of fracture, foot brace applied by ortho tech Pt with persistently elevated BP x 4 hours in MAU today Consult Dr Herchel with presentation, exam findings and test results.  Admit to L&D for IOL    Assessment: 1. Gestational hypertension, third trimester   2. [redacted] weeks gestation of pregnancy     Plan: Admit to L&D for IOL    Olam Boards Certified Nurse-Midwife 10/08/2023 8:00 PM

## 2023-10-08 NOTE — MAU Note (Signed)
 Complains of left ankle pain since the fall prior to admission. No swelling or bruising noted. Warm to touch. Ambulatory without assistance.

## 2023-10-08 NOTE — Progress Notes (Signed)
 Patient ID: Pamela Mccarty, female   DOB: [birth year], 28 y.o.   MRN: [id number]   Subjective: -Care assumed of 28 y.o. [id number] at [redacted]w[redacted]d who presents for IOL s/t gHTN. In room to meet acquaintance of patient and family.  Patient reports HA of 8/10 and anxiety.  Patient requesting pain medication and trazodone  for sleep.   Objective: BP 128/81   Pulse 87   Temp 98.2 F (36.8 C) (Oral)   Resp 18   LMP 01/07/2023   SpO2 99%  No intake/output data recorded. Total I/O In: 489.5 [I.V.:239.5; IV Piggyback:250] Out: -   Fetal Monitoring: FHT: 145 bpm, Mod Var, -Decels, +Accels UC: Irregular    Physical Exam: General appearance: alert, well appearing, and in no distress. Chest: Normal Rate.  No respiratory distress. Abdominal exam: Gravid, Soft, Ctx palpate mild. Extremities: No apparent edema. No apparent tenderness. Skin exam: Warm Dry  Vaginal Exam: SVE:   Dilation: 2.5 Effacement (%): Thick Station: Ballotable Exam by:: [employer] Membranes:Intact Internal Monitors: None  Augmentation/Induction: Pitocin :Initiated Cytotec: S/p at 1800  Assessment:  IUP at 37 weeks Cat I FT  IOL GBS Positive Anxiety Headache  Plan: -Patient s/p zoloft  dosing. Reports some improvement.  -Discussed initiation of pitocin  in setting of multiparity and irregular contractions. -Reviewed r/b. Patient agreeable. -Will start at 2mUn/min and increase to 6mUn/min and hold.  -Wll give fioricet for HA.  -Will also give trazadone 100mg  for rest. PRN ordered placed.  -Patient Pamela Mccarty requests work note to avoid being dismissed from his employer.  Note given reflecting SO presence today.  -Continue other mgmt as ordered.   Harlene CROME Hutch Rhett,MSN, CNM 10/08/2023, 10:36 PM

## 2023-10-08 NOTE — H&P (Signed)
 OBSTETRIC ADMISSION HISTORY AND PHYSICAL  Pamela Mccarty is a 28 y.o. female [phone] with IUP at 104w0d by US  presenting for IOL for newly dx'ed gHTN after initially presenting via EMS for painful contractions and L ankle pain following fall prior to arrival to hospital (porch off mobile home collapsed under weight of her, EMS stretcher, 3 EMS providers and one or two other family members, per EMS report pt did not fall / remained standing and no trauma to patient w/o any obvious injuries). She reports +FMs, No LOF, no VB, no blurry vision, headaches or peripheral edema, and RUQ pain.  She plans on breast feeding. She request OP Nexplanon for birth control. She received her [facility]   Dating: By US  --->  Estimated Date of Delivery: 10/29/23  Sono:    @[redacted]w[redacted]d , CWD, normal anatomy, cephalic presentation, anterior placental lie, 658g, 31% EFW   Prenatal History/Complications:  Patient Active Problem List   Diagnosis Date Noted   Heart palpitations 06/30/2023   GBS bacteriuria 05/19/2023   History of preterm delivery 05/14/2023   Encounter for supervision of normal pregnancy, antepartum 04/29/2023   Depression with anxiety 09/18/2020   Seizure disorder (HCC) 02/27/2018   Opioid use disorder, severe, dependence (HCC) 12/10/2015   MDD (major depressive disorder), recurrent severe, 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/2014   Attention-deficit hyperactivity disorder, 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   Oppositional defiant disorder 06/28/2011        NURSING  PROVIDER  Office Location [facility] Dating by U/S at 13 wks  [facility] Model Traditional Anatomy U/S Normal female 'Jazymne'  Initiated care at  16wks                 Language  English               LAB RESULTS   Support Person   Genetics NIPS: LR female AFP:       NT/IT (FT only) neg      Carrier Screen Horizon: 09/17/20 neg  Rhogam  B/Positive/-- (01/22 1041) A1C/GTT Early HgbA1C: 4.8 Third trimester 2 hr GTT: wnl  Flu Vaccine  declined      TDaP Vaccine  08/21/23  Blood Type B/Positive/-- (01/22 1041)  RSV Vaccine   Antibody Negative (01/22 1041)  COVID Vaccine   Rubella 3.41 (01/22 1041)  Feeding Plan breast RPR Non Reactive (01/22 1041)  Contraception Nexp @ [facility] HBsAg Negative (01/22 1041)  Circumcision N/a HIV Non Reactive (01/22 1041)  Pediatrician  [facility] HCVAb Non Reactive (01/22 1041)  Prenatal Classes discussed      BTL Consent   Pap 10/03/23  BTL Pre-payment   GC/CT Initial:  -/- 36wks:  -/-  VBAC Consent N/a GBS  POS For PCN allergy, check sensitivities   BRx Optimized? [ ]  yes   [ ]  no      DME Rx [ ]  BP cuff [ ]  Weight Scale Waterbirth  [ ]  Class [ ]  Consent [ ]  CNM visit  PHQ9 & GAD7 [ x ] new OB [  ] 28 weeks  [  ] 36 weeks Induction  [ ]  Orders Entered [ ] Foley Y/N     Past Medical History: Past Medical History:  Diagnosis Date   ADHD (attention deficit hyperactivity disorder)    Anxiety    Asthma    inhaler as a child   Depression    Epilepsy (HCC)    Headache(784.0)    Obesity    Seizures (HCC)  Seizures (HCC) 02/02/2021   per pt; 2/2 post partum blood loss    Past Surgical History: Past Surgical History:  Procedure Laterality Date   ADENOIDECTOMY     ANKLE SURGERY     ANKLE SURGERY  at 28yo   Pt. reports to lengthen her tendons   TONSILLECTOMY      Obstetrical History: OB History     Gravida  4   Para  1   Term      Preterm  1   AB  2   Living  1      SAB  2   IAB      Ectopic      Multiple  0   Live Births  1           Social History Social History   Socioeconomic History   Marital status: Media planner    Spouse name: Regulatory affairs officer   Number of children: 1   Years of education: Not on file   Highest education level: Not on file  Occupational History   Occupation: Full Time    Comment: [employer]  Tobacco Use   Smoking status: Some Days    Current packs/day: 0.25    Types: Cigarettes   Smokeless tobacco: Never   Tobacco  comments:    Family does not know  Vaping Use   Vaping status: Never Used  Substance and Sexual Activity   Alcohol use: No   Drug use: Not Currently    Types: Marijuana    Comment: last used october 2024   Sexual activity: Yes    Partners: Male    Birth control/protection: None  Other Topics Concern   Not on file  Social History Narrative   Not on file   Social Drivers of Health   Financial Resource Strain: Low Risk  (05/15/2023)   Overall Financial Resource Strain (CARDIA)    Difficulty of Paying Living Expenses: Not hard at all  Food Insecurity: No Food Insecurity (09/26/2023)   Hunger Vital Sign    Worried About Running Out of Food in the Last Year: Never true    Ran Out of Food in the Last Year: Never true  Transportation Needs: No Transportation Needs (09/26/2023)   PRAPARE - Administrator, Civil Service (Medical): No    Lack of Transportation (Non-Medical): No  Physical Activity: Insufficiently Active (05/15/2023)   Exercise Vital Sign    Days of Exercise per Week: 2 days    Minutes of Exercise per Session: 30 min  Stress: No Stress Concern Present (05/15/2023)   [employer] of Occupational Health - Occupational Stress Questionnaire    Feeling of Stress : Only a little  Social Connections: Moderately Integrated (05/15/2023)   Social Connection and Isolation Panel    Frequency of Communication with Friends and Family: More than three times a week    Frequency of Social Gatherings with Friends and Family: Twice a week    Attends Religious Services: 1 to 4 times per year    Active Member of [employer] or Organizations: Not on file    Attends Banker Meetings: Never    Marital Status: Living with partner    Family History: Family History  Problem Relation Age of Onset   Heart disease Maternal Grandfather    Heart attack Maternal Grandfather    Hypertension Father    Stroke Father    Seizures Father    Bipolar disorder Mother    Cancer Mother  Diabetes Mother    Hypertension Mother    Seizures Mother    Seizures Brother     Allergies: Allergies  Allergen Reactions   Peanut-Containing Drug Products Anaphylaxis    Stomach bubbles with Peanut oil only  (Patient states that she is not allergic to this)   Latex Other (See Comments)    Redness at site   Soy Allergy (Obsolete) Hives   Tomato     Throat swelling, shortness of breath     Medications Prior to Admission  Medication Sig Dispense Refill Last Dose/Taking   acetaminophen  (TYLENOL ) 500 MG tablet Take 500 mg by mouth every 4 (four) hours as needed for moderate pain (pain score 4-6) or fever.   Past Week   cyclobenzaprine  (FLEXERIL ) 10 MG tablet Take 1 tablet (10 mg total) by mouth 2 (two) times daily as needed for muscle spasms. 20 tablet 0 10/08/2023   Doxylamine -Pyridoxine  (DICLEGIS ) 10-10 MG TBEC Take 2 qhs; may also take one in am and one in afternoon prn nausea 120 tablet 6 10/08/2023   ondansetron  (ZOFRAN ) 4 MG tablet Take 1 tablet (4 mg total) by mouth every 6 (six) hours. 30 tablet 2 10/08/2023   prenatal vitamin w/FE, FA (PRENATAL 1 + 1) 27-1 MG TABS tablet Take 1 tablet by mouth daily at 12 noon. 90 tablet 3 10/08/2023   promethazine  (PHENERGAN ) 25 MG tablet Take 1 tablet (25 mg total) by mouth every 6 (six) hours as needed for nausea or vomiting. 30 tablet 1 10/08/2023   sertraline  (ZOLOFT ) 50 MG tablet Take 1 tablet (50 mg total) by mouth daily. 30 tablet 3 10/08/2023   Blood Pressure Monitor MISC For regular home bp monitoring during pregnancy 1 each 0      Review of Systems   All systems reviewed and negative except as stated in HPI  Blood pressure (!) 148/94, pulse (!) 119, temperature 97.9 F (36.6 C), resp. rate 18, last menstrual period 01/07/2023, SpO2 99%. General appearance: alert, cooperative, and appears stated age Lungs: clear to auscultation bilaterally Heart: regular rate and rhythm Abdomen: soft, non-tender; bowel sounds normal Pelvic: normal  female genitalia Extremities: Homans sign is negative, no sign of DVT Presentation: cephalic Fetal monitoringBaseline: 135 bpm, Variability: Good {> 6 bpm), Accelerations: Reactive, and Decelerations: Absent Uterine activity q43mins Dilation: 3 Effacement (%): 20 Station: -3 Exam by:: Leftwich-Kirby,CNM   Prenatal labs: ABO, Rh: B/Positive/-- (01/22 1041) Antibody: Negative (05/01 0804) Rubella: 3.41 (01/22 1041) RPR: Non Reactive (05/01 0804)  HBsAg: Negative (01/22 1041)  HIV: Non Reactive (05/01 0804)  GBS:      Lab Results  Component Value Date   GBS NEGATIVE 01/29/2021   GTT nrl Genetic screening  NIPS LR female, AFP neg Horizon neg 09/2020 Anatomy US  nrl  Immunization History  Administered Date(s) Administered   Influenza,inj,Quad PF,6+ Mos 12/26/2020   PNEUMOCOCCAL CONJUGATE-20 07/25/2021   Tdap 12/26/2020, 08/21/2023    Prenatal Transfer Tool  Maternal Diabetes: No Genetic Screening: Normal Maternal Ultrasounds/Referrals: Normal Fetal Ultrasounds or other Referrals:  None Maternal Substance Abuse:  prior hx of opioid abuse but sober  Significant Maternal Medications:  Meds include: Zoloft  Significant Maternal Lab Results: Group B Strep negative Number of Prenatal Visits:greater than 3 verified prenatal visits Maternal Vaccinations:TDap Other Comments:  None   Results for orders placed or performed during the hospital encounter of 10/08/23 (from the past 24 hours)  Fern Test   Collection Time: 10/08/23 12:35 PM  Result Value Ref Range   POCT [employer] Test  Negative = intact amniotic membranes   Rupture of Membrane (ROM) Plus   Collection Time: 10/08/23 12:36 PM  Result Value Ref Range   Rom Plus NEGATIVE   Protein / creatinine ratio, urine   Collection Time: 10/08/23  1:43 PM  Result Value Ref Range   Creatinine, Urine 72 mg/dL   Total Protein, Urine 7 mg/dL   Protein Creatinine Ratio 0.10 0.00 - 0.15 mg/mg[Cre]  Urinalysis, Routine w reflex microscopic  -Urine, Clean Catch   Collection Time: 10/08/23  1:46 PM  Result Value Ref Range   Color, Urine YELLOW YELLOW   APPearance HAZY (A) CLEAR   Specific Gravity, Urine 1.015 1.005 - 1.030   pH 6.0 5.0 - 8.0   Glucose, UA NEGATIVE NEGATIVE mg/dL   Hgb urine dipstick NEGATIVE NEGATIVE   Bilirubin Urine NEGATIVE NEGATIVE   Ketones, ur NEGATIVE NEGATIVE mg/dL   Protein, ur NEGATIVE NEGATIVE mg/dL   Nitrite NEGATIVE NEGATIVE   Leukocytes,Ua NEGATIVE NEGATIVE  CBC   Collection Time: 10/08/23  2:00 PM  Result Value Ref Range   WBC 11.0 (H) 4.0 - 10.5 K/uL   RBC 5.09 3.87 - 5.11 MIL/uL   Hemoglobin 15.4 (H) 12.0 - 15.0 g/dL   HCT 54.8 63.9 - 53.9 %   MCV 88.6 80.0 - 100.0 fL   MCH 30.3 26.0 - 34.0 pg   MCHC 34.1 30.0 - 36.0 g/dL   RDW 86.7 88.4 - 84.4 %   Platelets 247 150 - 400 K/uL   nRBC 0.0 0.0 - 0.2 %  Comprehensive metabolic panel with GFR   Collection Time: 10/08/23  2:00 PM  Result Value Ref Range   Sodium 137 135 - 145 mmol/L   Potassium 3.8 3.5 - 5.1 mmol/L   Chloride 109 98 - 111 mmol/L   CO2 19 (L) 22 - 32 mmol/L   Glucose, Bld 70 70 - 99 mg/dL   BUN 10 6 - 20 mg/dL   Creatinine, Ser 9.32 0.44 - 1.00 mg/dL   Calcium  8.8 (L) 8.9 - 10.3 mg/dL   Total Protein 6.1 (L) 6.5 - 8.1 g/dL   Albumin 2.6 (L) 3.5 - 5.0 g/dL   AST 15 15 - 41 U/L   ALT 16 0 - 44 U/L   Alkaline Phosphatase 85 38 - 126 U/L   Total Bilirubin 0.5 0.0 - 1.2 mg/dL   GFR, Estimated >39 >39 mL/min   Anion gap 9 5 - 15    Patient Active Problem List   Diagnosis Date Noted   Heart palpitations 06/30/2023   GBS bacteriuria 05/19/2023   History of preterm delivery 05/14/2023   Encounter for supervision of normal pregnancy, antepartum 04/29/2023   Depression with anxiety 09/18/2020   Seizure disorder (HCC) 02/27/2018   Opioid use disorder, severe, dependence (HCC) 12/10/2015   MDD (major depressive disorder), recurrent severe, 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/2014   Attention-deficit hyperactivity  disorder, 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   Oppositional defiant disorder 06/28/2011    Assessment/Plan:  Pamela Mccarty is a 28 y.o. [id number] at [redacted]w[redacted]d here for IOL for newly dx'ed gHTN.   #Labor: AROM vs pitocin .  #Pain: Per patient request #FWB: Cat 1 #GBS status:  negative #Feeding: Breastmilk  #Reproductive Life planning: OP Nexplanon  #gHTN: Pt ruled in for sustained mild range pressures 4hrs apart. UPC 0.10 // 247 // 15/16, Cr 0.67,  - CTM bps   #L ankle: negative XR - continue brace   Augustin JAYSON Slade, MD  10/08/2023, 4:40 PM

## 2023-10-08 NOTE — MAU Note (Signed)
 Pamela Mccarty is a 28 y.o. at [redacted]w[redacted]d here in MAU reporting: EMS arrival with ct every 1 min and urge to push. Midwife  met pt at ambulance bay. Appeared stable and did not have urge to push at this time. Took pt to MAU room 122. SVE 3/20/-2. Fern slide collected   LMP:  Onset of complaint: this morning Pain score: 8 Vitals:   10/08/23 1139  BP: 128/84  Pulse: 98     FHT: 145  Lab orders placed from triage: labor eval

## 2023-10-09 ENCOUNTER — Encounter (HOSPITAL_COMMUNITY): Payer: Self-pay | Admitting: Obstetrics & Gynecology

## 2023-10-09 ENCOUNTER — Encounter: Admitting: Women's Health

## 2023-10-09 DIAGNOSIS — O134 Gestational [pregnancy-induced] hypertension without significant proteinuria, complicating childbirth: Secondary | ICD-10-CM | POA: Diagnosis not present

## 2023-10-09 DIAGNOSIS — O99344 Other mental disorders complicating childbirth: Secondary | ICD-10-CM | POA: Diagnosis not present

## 2023-10-09 DIAGNOSIS — Z3A37 37 weeks gestation of pregnancy: Secondary | ICD-10-CM

## 2023-10-09 DIAGNOSIS — O326XX0 Maternal care for compound presentation, not applicable or unspecified: Secondary | ICD-10-CM | POA: Diagnosis not present

## 2023-10-09 DIAGNOSIS — O99820 Streptococcus B carrier state complicating pregnancy: Secondary | ICD-10-CM | POA: Diagnosis not present

## 2023-10-09 LAB — RPR: RPR Ser Ql: NONREACTIVE

## 2023-10-09 MED ORDER — OXYCODONE HCL 5 MG PO TABS
5.0000 mg | ORAL_TABLET | ORAL | Status: DC | PRN
  Administered 2023-10-09 – 2023-10-10 (×3): 5 mg via ORAL
  Filled 2023-10-09 (×3): qty 1

## 2023-10-09 MED ORDER — PHENYLEPHRINE 80 MCG/ML (10ML) SYRINGE FOR IV PUSH (FOR BLOOD PRESSURE SUPPORT): 80.0000 ug | PREFILLED_SYRINGE | INTRAVENOUS | Status: DC | PRN

## 2023-10-09 MED ORDER — SIMETHICONE 80 MG PO CHEW: 80.0000 mg | CHEWABLE_TABLET | ORAL | Status: DC | PRN

## 2023-10-09 MED ORDER — ACETAMINOPHEN 500 MG PO TABS
1000.0000 mg | ORAL_TABLET | Freq: Four times a day (QID) | ORAL | Status: DC | PRN
  Administered 2023-10-10 – 2023-10-11 (×2): 1000 mg via ORAL
  Filled 2023-10-09 (×2): qty 2

## 2023-10-09 MED ORDER — BENZOCAINE-MENTHOL 20-0.5 % EX AERO
1.0000 | INHALATION_SPRAY | CUTANEOUS | Status: DC | PRN
  Filled 2023-10-09: qty 56

## 2023-10-09 MED ORDER — DIPHENHYDRAMINE HCL 25 MG PO CAPS: 25.0000 mg | ORAL_CAPSULE | Freq: Four times a day (QID) | ORAL | Status: DC | PRN

## 2023-10-09 MED ORDER — OXYCODONE-ACETAMINOPHEN 5-325 MG PO TABS
1.0000 | ORAL_TABLET | Freq: Once | ORAL | Status: AC
  Administered 2023-10-09: 1 via ORAL
  Filled 2023-10-09: qty 1

## 2023-10-09 MED ORDER — DIBUCAINE (PERIANAL) 1 % EX OINT: 1.0000 | TOPICAL_OINTMENT | CUTANEOUS | Status: DC | PRN

## 2023-10-09 MED ORDER — SENNOSIDES-DOCUSATE SODIUM 8.6-50 MG PO TABS
2.0000 | ORAL_TABLET | ORAL | Status: DC
  Administered 2023-10-09 – 2023-10-11 (×3): 2 via ORAL
  Filled 2023-10-09 (×3): qty 2

## 2023-10-09 MED ORDER — ACETAMINOPHEN 325 MG PO TABS
650.0000 mg | ORAL_TABLET | ORAL | Status: DC | PRN
  Administered 2023-10-09 (×2): 650 mg via ORAL
  Filled 2023-10-09 (×2): qty 2

## 2023-10-09 MED ORDER — LACTATED RINGERS IV SOLN: 500.0000 mL | Freq: Once | INTRAVENOUS | Status: DC

## 2023-10-09 MED ORDER — ONDANSETRON HCL 4 MG PO TABS: 4.0000 mg | ORAL_TABLET | ORAL | Status: DC | PRN

## 2023-10-09 MED ORDER — FENTANYL-BUPIVACAINE-NACL 0.5-0.125-0.9 MG/250ML-% EP SOLN: 12.0000 mL/h | EPIDURAL | Status: DC | PRN

## 2023-10-09 MED ORDER — ONDANSETRON HCL 4 MG/2ML IJ SOLN: 4.0000 mg | INTRAMUSCULAR | Status: DC | PRN

## 2023-10-09 MED ORDER — EPHEDRINE 5 MG/ML INJ: 10.0000 mg | INTRAVENOUS | Status: DC | PRN

## 2023-10-09 MED ORDER — TETANUS-DIPHTH-ACELL PERTUSSIS 5-2.5-18.5 LF-MCG/0.5 IM SUSY: 0.5000 mL | PREFILLED_SYRINGE | Freq: Once | INTRAMUSCULAR | Status: DC

## 2023-10-09 MED ORDER — IBUPROFEN 600 MG PO TABS
600.0000 mg | ORAL_TABLET | Freq: Four times a day (QID) | ORAL | Status: DC
  Administered 2023-10-09 – 2023-10-11 (×10): 600 mg via ORAL
  Filled 2023-10-09 (×10): qty 1

## 2023-10-09 MED ORDER — DIPHENHYDRAMINE HCL 50 MG/ML IJ SOLN: 12.5000 mg | INTRAMUSCULAR | Status: DC | PRN

## 2023-10-09 MED ORDER — FUROSEMIDE 20 MG PO TABS
20.0000 mg | ORAL_TABLET | Freq: Every day | ORAL | Status: DC
  Administered 2023-10-09 – 2023-10-11 (×3): 20 mg via ORAL
  Filled 2023-10-09 (×3): qty 1

## 2023-10-09 MED ORDER — COCONUT OIL OIL: 1.0000 | TOPICAL_OIL | Status: DC | PRN

## 2023-10-09 MED ORDER — WITCH HAZEL-GLYCERIN EX PADS: 1.0000 | MEDICATED_PAD | CUTANEOUS | Status: DC | PRN

## 2023-10-09 NOTE — Lactation Note (Signed)
 This note was copied from a baby's chart. Lactation Consultation Note  Patient Name: Pamela Mccarty Date: 10/09/2023 Age:26 hours, P2 Experienced breast feeder  Reason for consult: Initial assessment;Early term 37-38.6wks;Breastfeeding assistance Per mom baby didn't breast feed after birth and hasn't since.  The tech had brought in formula at moms request and when [facility] asked if she wanted to try to latch she said yes for assistance. LC changed a wet diaper and the nurse took it for UDS and smear stool.  LC placed the baby STS on the left breast, reviewed hand expressing and  with a few tiny drops and with gloved finger swabbed the baby's  mouth. Reverse pressure due to areola edema Baby latched after several attempts and fed fro 7 mins with a few swallows. Baby released on her own.  Baby latched after several attempts and fed for 7 mins with a few swallows. Baby released on her own. Baby STS afterwards.  LC explained the breast feeding goals for 24 hours to feed with cues and by 3 hours if the baby is sleeping wake her up offer the breast STS and feed for 15 -20 mins , supplement according to the guide lines due to her low birth weight and being Early term.  MBU nurse Rosina aware to prepare the care plan with the amounts for supplementing and the LC set up the DEBP ( see below) . Mom aware its recommended to pump after the baby is settled and save the milk for the next feeding.  Maternal Data Has patient been taught Hand Expression?: Yes (sveral tiny drops, swabbed  baby's mouth) Does the patient have breastfeeding experience prior to this delivery?: Yes How long did the patient breastfeed?: per mom 9 months with her 1st baby and pumped  Feeding Mother's Current Feeding Choice: Breast Milk and Formula  LATCH Score Latch: Repeated attempts needed to sustain latch, nipple held in mouth throughout feeding, stimulation needed to elicit sucking reflex.  Audible Swallowing: A few with  stimulation  Type of Nipple: Everted at rest and after stimulation  Comfort (Breast/Nipple): Soft / non-tender  Hold (Positioning): Assistance needed to correctly position infant at breast and maintain latch.  LATCH Score: 7   Lactation Tools Discussed/Used Tools: Pump;Flanges Flange Size: 18 Breast pump type: Manual;Double-Electric Breast Pump Pump Education: Milk Storage;Setup, frequency, and cleaning;Other (comment) (LC set up the DEBP with instructions and mom wanted to wait until the next feeding due to needing to eat her breakfast, Flange was checked) Reason for Pumping: due to Early term, 6 - 1oz at birth .  Interventions Interventions: Breast feeding basics reviewed;Assisted with latch;Skin to skin;Breast massage;Hand express;Pre-pump if needed;Reverse pressure;Breast compression;Adjust position;Support pillows;Position options;Hand pump;DEBP;Education;CDC milk storage guidelines;CDC Guidelines for Breast Pump Cleaning;LC Services brochure  Discharge Pump: DEBP;Personal;Manual (per mom has a DEBP Medela at home) [facility] Program: Yes  Consult Status Consult Status: Follow-up Date: 10/10/23 Follow-up type: In-patient    Pamela Mccarty 10/09/2023, 11:24 AM

## 2023-10-09 NOTE — Plan of Care (Signed)

## 2023-10-09 NOTE — Discharge Summary (Signed)
 Postpartum Discharge Summary      Patient Name: Pamela Mccarty DOB: December 28, 1995 MRN: [id number]  Date of admission: 10/08/2023 Delivery date:10/09/2023 Delivering provider: SYNTHIA RAISIN Date of discharge: 10/11/2023  Admitting diagnosis: Gestational hypertension [O13.9] Intrauterine pregnancy: [redacted]w[redacted]d     Secondary diagnosis:  Principal Problem:   Gestational hypertension Active Problems:   Vaginal delivery   Obstetric labial laceration, delivered, current hospitalization  Additional problems: None    Discharge diagnosis: Term Pregnancy Delivered and Gestational Hypertension                                              Post partum procedures:none Induction: Pitocin  and Cytotec  Complications: None  Hospital course: Induction of Labor With Vaginal Delivery   28 y.o. yo [phone] at [redacted]w[redacted]d was admitted to the hospital 10/08/2023 for induction of labor.  Indication for induction: Gestational hypertension.  Patient had an labor course complicated by [facility] and anxiety.  Membrane Rupture Time/Date: 1:50 AM,10/09/2023  Delivery Method:Vaginal, Spontaneous Operative Delivery:N/A Episiotomy: None Lacerations:  Labial Details of delivery can be found in separate delivery note.  Patient had a postpartum course complicated by concern for bleeding more than normal but Hgb was stable, Fundus firm on exam w/ scant lochia. Patient is discharged home 10/11/23.  Newborn Data: Birth date:10/09/2023 Birth time:2:12 AM Gender:Female-Pamela Mccarty Living status:Living Apgars:2 ,7  Weight:2750 g  Magnesium  Sulfate received: No BMZ received: No Rhophylac:N/A MMR:N/A T-DaP:Given prenatally Flu: N/A RSV Vaccine received: No Transfusion:No  Immunizations received: Immunization History  Administered Date(s) Administered   Influenza,inj,Quad PF,6+ Mos 12/26/2020   PNEUMOCOCCAL CONJUGATE-20 07/25/2021   Tdap 12/26/2020, 08/21/2023    Physical exam  Vitals:   10/10/23 0600 10/10/23 1207  10/10/23 2100 10/11/23 0629  BP: 111/78 124/73  118/72  Pulse: 82 90 75 83  Resp: 18 16 18 18   Temp: 98 F (36.7 C) 98 F (36.7 C) 98.8 F (37.1 C) 98.6 F (37 C)  TempSrc: Oral Oral Oral Oral  SpO2: 98% 99%  100%  Weight:      Height:       General: alert, cooperative, and no distress Lochia: appropriate Uterine Fundus: firm Incision: N/A DVT Evaluation: No evidence of DVT seen on physical exam. Labs:    Latest Ref Rng & Units 10/11/2023    7:12 AM 10/10/2023    5:01 AM 10/08/2023    2:00 PM  CBC  WBC 4.0 - 10.5 K/uL 7.6  10.2  11.0   Hemoglobin 12.0 - 15.0 g/dL 87.2  86.9  84.5   Hematocrit 36.0 - 46.0 % 37.7  38.9  45.1   Platelets 150 - 400 K/uL 200  206  247        Latest Ref Rng & Units 10/08/2023    2:00 PM  CMP  Glucose 70 - 99 mg/dL 70   BUN 6 - 20 mg/dL 10   Creatinine 9.55 - 1.00 mg/dL 9.32   Sodium 864 - 854 mmol/L 137   Potassium 3.5 - 5.1 mmol/L 3.8   Chloride 98 - 111 mmol/L 109   CO2 22 - 32 mmol/L 19   Calcium  8.9 - 10.3 mg/dL 8.8   Total Protein 6.5 - 8.1 g/dL 6.1   Total Bilirubin 0.0 - 1.2 mg/dL 0.5   Alkaline Phos 38 - 126 U/L 85   AST 15 - 41 U/L 15  ALT 0 - 44 U/L 16    Edinburgh Score:    10/09/2023   10:41 AM  Edinburgh Postnatal Depression Scale Screening Tool  I have been able to laugh and see the funny side of things. 0  I have looked forward with enjoyment to things. 0  I have blamed myself unnecessarily when things went wrong. 1  I have been anxious or worried for no good reason. 0  I have felt scared or panicky for no good reason. 0  Things have been getting on top of me. 0  I have been so unhappy that I have had difficulty sleeping. 0  I have felt sad or miserable. 0  I have been so unhappy that I have been crying. 0  The thought of harming myself has occurred to me. 0  Edinburgh Postnatal Depression Scale Total 1   No data recorded  After visit meds:  Allergies as of 10/11/2023       Reactions   Peanut-containing Drug  Products Anaphylaxis   Stomach bubbles with Peanut oil only (Patient states that she is not allergic to this)   Latex Other (See Comments)   Redness at site   Soy Allergy (obsolete) Hives   Tomato    Throat swelling, shortness of breath        Medication List     STOP taking these medications    acetaminophen  500 MG tablet Commonly known as: TYLENOL    Blood Pressure Monitor Misc   cyclobenzaprine  10 MG tablet Commonly known as: FLEXERIL    Doxylamine -Pyridoxine  10-10 MG Tbec Commonly known as: Diclegis    ondansetron  4 MG tablet Commonly known as: ZOFRAN    promethazine  25 MG tablet Commonly known as: PHENERGAN        TAKE these medications    furosemide  20 MG tablet Commonly known as: LASIX  Take 1 tablet (20 mg total) by mouth daily.   ibuprofen  600 MG tablet Commonly known as: ADVIL  Take 1 tablet (600 mg total) by mouth every 6 (six) hours.   NIFEdipine  30 MG 24 hr tablet Commonly known as: ADALAT  CC Take 1 tablet (30 mg total) by mouth daily.   potassium chloride  SA 20 MEQ tablet Commonly known as: KLOR-CON  M Take 2 tablets (40 mEq total) by mouth daily.   prenatal vitamin w/FE, FA 27-1 MG Tabs tablet Take 1 tablet by mouth daily at 12 noon.   sertraline  50 MG tablet Commonly known as: ZOLOFT  Take 1 tablet (50 mg total) by mouth daily.         Discharge home in stable condition Infant Feeding: Breast Infant Disposition:home with mother Discharge instruction: per After Visit Summary and Postpartum booklet. Activity: Advance as tolerated. Pelvic rest for 6 weeks.  Diet: routine diet Future Appointments: Future Appointments  Date Time Provider Department Center  10/16/2023 10:30 AM [facility] NURSE [facility]  11/19/2023 10:30 AM Kizzie Suzen SAUNDERS, CNM [facility]  12/31/2023  8:40 AM Grooms, Charmaine RIGGERS [facility]   Follow up Visit:  Follow-up Information     [facility] Follow up.    Specialty: Obstetrics and Gynecology Why: For postpartum visit Contact information: [address] [place]  [address] [phone]        [facility] Follow up.   Specialty: Obstetrics and Gynecology Why: As needed in emergencies Contact information: [address] [place]  [phone]  Message sent 7/3 at 0901   Please schedule this patient for a In person postpartum visit in 6 weeks with the following provider: Any provider. Additional Postpartum F/U:BP check 1 week  High risk pregnancy complicated by: GHTN Delivery mode:  Vaginal, Spontaneous Anticipated Birth Control:  Nexplanon   10/11/2023 Pamela Mccarty  Pamela Mccarty, CNM

## 2023-10-10 LAB — CBC
HCT: 38.9 % (ref 36.0–46.0)
Hemoglobin: 13 g/dL (ref 12.0–15.0)
MCH: 29.8 pg (ref 26.0–34.0)
MCHC: 33.4 g/dL (ref 30.0–36.0)
MCV: 89.2 fL (ref 80.0–100.0)
Platelets: 206 K/uL (ref 150–400)
RBC: 4.36 MIL/uL (ref 3.87–5.11)
RDW: 13 % (ref 11.5–15.5)
WBC: 10.2 K/uL (ref 4.0–10.5)
nRBC: 0 % (ref 0.0–0.2)

## 2023-10-10 MED ORDER — CYCLOBENZAPRINE HCL 5 MG PO TABS: 10.0000 mg | ORAL_TABLET | Freq: Three times a day (TID) | ORAL | Status: DC | PRN

## 2023-10-10 MED ORDER — NIFEDIPINE ER OSMOTIC RELEASE 30 MG PO TB24
30.0000 mg | ORAL_TABLET | Freq: Every day | ORAL | Status: DC
  Administered 2023-10-10 – 2023-10-11 (×2): 30 mg via ORAL
  Filled 2023-10-10 (×2): qty 1

## 2023-10-10 NOTE — Progress Notes (Signed)
 Post Partum Day 1 Subjective: no complaints, up ad lib, and voiding Having some mildly heavier lochia  Objective: Blood pressure 111/78, pulse 82, temperature 98 F (36.7 C), temperature source Oral, resp. rate 18, height 5' 8 (1.727 m), weight 101.6 kg, last menstrual period 01/07/2023, SpO2 98%, unknown if currently breastfeeding.  Physical Exam:  General: alert, cooperative, and appears stated age Lochia: appropriate Uterine Fundus: firm DVT Evaluation: No evidence of DVT seen on physical exam.  Recent Labs    10/08/23 1400 10/10/23 0501  HGB 15.4* 13.0  HCT 45.1 38.9    Assessment/Plan: Plan for discharge tomorrow, Discharge home, and Contraception Nexplanon outpt. Stable CBC   LOS: 2 days   Glenys GORMAN Birk, MD 10/10/2023, 7:35 AM

## 2023-10-10 NOTE — Progress Notes (Addendum)
 CSW contacted [facility] DSS CPS 24/7 Hotline to a make a report, no answer. CSW left a voicemail requesting a return call.   After receiving no return call, CSW contacted [facility] non emergency line and received a return call from [employer]. CSW made a CPS report to Deputy Claudene due to infant's positive UDS for (Barbiturates(rx) and Benzodiazepines). CSW inquired about whether CPS would be in touch with CSW, Deputy reported that he was unsure and agreed to provide CSW's contact information to CPS.   Suzen Law, LCSW Clinical Social Worker [facility] Cell#: [phone]

## 2023-10-10 NOTE — Lactation Note (Signed)
 This note was copied from a baby's chart. Lactation Consultation Note  Patient Name: Pamela Mccarty Unijb'd Date: 10/10/2023 Age:26 hours Reason for consult: Follow-up assessment;Early term 37-38.6wks;Infant weight loss (Infant with -3.61% weight loss). See MOB- MR   Per MOB,  infant is latching well at the breast,infant is latching on both breast during a feeding. Per MOB,  infant started cluster feeding last night, Infant with breastfeed for 20 to 30 minutes. MOB has only supplemented infant with formula 3X since birth.  MOB will supplement infant  if she feels infant wants more after breastfeeding for 30 minutes. MOB has handout Supplementing with Breastfeeding. MOB has not used the DEBP yet, LC reviewed how to use from hand pump and the  DEBP. When demonstrating hand pump MOB expressed 1 ml of colostrum that was spoon fed to infant. MOB plans to use the DEBP after she finish eating her dinner.   LC did not observe latch due MOB breastfeeding infant for 20 minutes and supplementing infant with 15 mls of 20 kcal formula at 2250 pm. MOB knows that her EBM is safe for 4 hours whereas formula once open is only safe for 1 hour.   Feeding Plan on Day 2 of life. 1- MOB will continue to breastfeed infant by cues, on demand, 8-12 times within 24 hours, skin to skin. 2- MOB knows if infant is still cuing after latching on the first breast to offer the 2nd breast during the same feeding. 3- MOB plans to supplement infant at her own discretion, she will offer any EBM that is pumped first before formula. 4- MOB plans to continue to use the DEBP every 3 hours for 15 minutes.  Maternal Data    Feeding Mother's Current Feeding Choice: Breast Milk and Formula  LATCH Score                    Lactation Tools Discussed/Used    Interventions Interventions: Skin to skin;Breast compression;Support pillows;Position options;Expressed milk;Hand express;Education;Guidelines for Milk Supply and  Pumping Schedule Handout;CDC milk storage guidelines;CDC Guidelines for Breast Pump Cleaning;Pace feeding  Discharge Pump: DEBP;Personal  Consult Status Consult Status: Follow-up Date: 10/11/23 Follow-up type: In-patient    Pamela Mccarty 10/10/2023, 11:55 PM

## 2023-10-10 NOTE — Patient Instructions (Signed)
 If interested in an outpatient lactation consult in office or virtually please reach out to us  at [facility] (First Floor) [address], [place] Please call [phone] and press 4 for lactation.   -- Lactation support groups:  [facility], Tuesdays 10:00 am -12:00 pm at [address] on the second floor in the conference room, lactating parents and lap babies welcome.  [email or web address]  [email or web address]     Vermell CINDERELLA Pelt, [facility] for [facility]

## 2023-10-10 NOTE — Progress Notes (Signed)
 CLINICAL SOCIAL WORK MATERNAL/CHILD NOTE  Patient Details  Name: Pamela Mccarty MRN: [id number] Date of Birth: 10/09/2021  Date:  10/10/2023  Clinical Social Worker Initiating Note:  Pamela Mccarty, [place] Date/Time: Initiated:  10/10/23/1106     Child's Name:  Pamela Mccarty   Biological Parents:  Mother, Father (Father: Omega Mccarty 07/26/87)   Need for Interpreter:  None   Reason for Referral:  Behavioral Health Concerns, Current Substance Use/Substance Use During Pregnancy     Address:  [address] [place] [address]    Phone number:  [phone] (home)     Additional phone number:   Household Members/Support Persons (HM/SP):   Household Member/Support Person 1, Household Member/Support Person 2   HM/SP Name Relationship DOB or Age  HM/SP -1 Pamela Mccarty brother    HM/SP -2 Pamela Mccarty daughter 01/30/21  HM/SP -3        HM/SP -4        HM/SP -5        HM/SP -6        HM/SP -7        HM/SP -8          Natural Supports (not living in the home):  Spouse/significant other   Professional Supports: None   Employment: Unemployed   Type of Work:     Education:  Attending college   Homebound arranged:    Surveyor, quantity Resources:  [employer], Media planner    Other Resources:  [employer], Sales executive     Cultural/Religious Considerations Which May Impact Care:    Strengths:  Ability to meet basic needs  , Home prepared for child  , Psychotropic Medications, Pediatrician chosen   Psychotropic Medications:  Zoloft       Pediatrician:    Pamela Mccarty  Pediatrician List:   [facility] [place]    [place] Other [phone] Pediatrics)  [facility]      Pediatrician Fax Number:    Risk Factors/Current Problems:  Mental Health Concerns     Cognitive State:  Able to Concentrate  , Alert  , Linear Thinking  , Goal Oriented     Mood/Affect:  Euthymic  , Calm  , Comfortable  , Relaxed  , Interested      CSW Assessment: CSW met with MOB at bedside to complete psychosocial assessment. CSW introduced self and explained role. CSW asked was anyone in the restroom, MOB reported yes and noted that it was her Husband/FOB. MOB granted CSW verbal permission to speak in front of FOB about anything. MOB ended a phone call. CSW apologized as CSW did not realize MOB was on the phone, MOB reported that it was okay and shared that it was her brother. MOB was welcoming, talkative, open, pleasant, and remained engaged during assessment. MOB reported that she resides with her brother and older daughter. MOB shared that she recalls meeting CSW during her older daughter's admission and shared that her daughter is doing well. CSW celebrated her daughter's progress. MOB reported that they have all items needed to care for infant including a car seat and bassinet. MOB reported that she receives both [facility] and food stamps. CSW inquired about MOB's support system, MOB reported that she has a good support system at home from her brother. MOB also mentioned Husband/FOB being a support.   CSW inquired about MOB's mental health history, MOB reported that she was diagnosed with anxiety, depression, and  ADHD around age 54. MOB reported that she recalls being placed on Adderall at age 71 and noted that she did not respond well to it. MOB reported that her mother discontinued the medication because she got sick. MOB denied any current symptoms of anxiety and depression and shared that she is currently taking Zoloft . MOB reported that the medication is helpful. MOB noted that she is also prescribed trazodone  for sleep. MOB reported that she probably needs a psychiatrist to speak with about different things. CSW asked if MOB was interested in resources for a psychiatrist, MOB declined needing resources. MOB denied a history of postpartum depression. MOB denied any additional mental health history. CSW inquired about how MOB was feeling emotionally  since giving birth, MOB reported that she was feeling okay and shared about a traumatic birthing experience. CSW acknowledged, normalized, and validated MOB's feelings. MOB presented calm and did not demonstrate any acute mental health signs/symptoms. CSW assessed for safety, MOB denied SI and HI. CSW did not assess for domestic violence as MOB was not alone.   CSW provided education regarding the baby blues period vs. perinatal mood disorders, discussed treatment and gave resources for mental health follow up if concerns arise.  CSW recommends self-evaluation during the postpartum time period using the New Mom Checklist from Postpartum Progress and encouraged MOB to contact a medical professional if symptoms are noted at any time.    CSW provided review of Sudden Infant Death Syndrome (SIDS) precautions. MOB reported that she was familiar.   CSW informed MOB about the hospital drug screen policy due to documented substance use during pregnancy. 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 MOB shared that she had an open CPS case around that time which closed after one year. MOB shared that they would come out and do random drug testing. 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 MOB reported that the case was closed and noted it was in the beginning of the year. CSW informed MOB that infant's UDS was positive for Benzodiazepines and Barbiturates (rx) and a CPS report would be made. CSW explained that CSW was able to verify that MOB was prescribed (Barbiturates Fioricet ). MOB reported that she was prescribed Ativan  by her PCP Pamela Mccarty at [facility]. MOB denied any illegal substance use during pregnancy. CSW verbalized understanding and explained that a report will still need to be made.   CSW inquired about  any needs for resources/supports, MOB denied any needs.   CSW will make a CPS report to [facility] DSS CPS due to infant's positive UDS for (Benzodiazepines and Barbiturates(rx)).  CSW will follow up with [facility] DSS CPS to see how the report is screened and if any follow up will take place prior to discharge.   CSW Plan/Description:  Sudden Infant Death Syndrome (SIDS) Education, Perinatal Mood and Anxiety Disorder (PMADs) Education, Hospital Drug Screen Policy Information, Child Protective Service Report  , CSW Awaiting CPS Disposition Plan    Pamela AXTELL, LCSW 10/10/2023, 11:10 AM

## 2023-10-11 LAB — CBC
HCT: 37.7 % (ref 36.0–46.0)
Hemoglobin: 12.7 g/dL (ref 12.0–15.0)
MCH: 30.3 pg (ref 26.0–34.0)
MCHC: 33.7 g/dL (ref 30.0–36.0)
MCV: 90 fL (ref 80.0–100.0)
Platelets: 200 K/uL (ref 150–400)
RBC: 4.19 MIL/uL (ref 3.87–5.11)
RDW: 12.9 % (ref 11.5–15.5)
WBC: 7.6 K/uL (ref 4.0–10.5)
nRBC: 0 % (ref 0.0–0.2)

## 2023-10-11 MED ORDER — SERTRALINE HCL 50 MG PO TABS: 50.0000 mg | ORAL_TABLET | Freq: Every day | ORAL | 3 refills | Status: DC

## 2023-10-11 MED ORDER — POTASSIUM CHLORIDE CRYS ER 20 MEQ PO TBCR: 40.0000 meq | EXTENDED_RELEASE_TABLET | Freq: Every day | ORAL | 0 refills | Status: DC

## 2023-10-11 MED ORDER — FUROSEMIDE 20 MG PO TABS: 20.0000 mg | ORAL_TABLET | Freq: Every day | ORAL | 0 refills | Status: DC

## 2023-10-11 MED ORDER — NIFEDIPINE ER 30 MG PO TB24: 30.0000 mg | ORAL_TABLET | Freq: Every day | ORAL | 1 refills | Status: DC

## 2023-10-11 MED ORDER — IBUPROFEN 600 MG PO TABS: 600.0000 mg | ORAL_TABLET | Freq: Four times a day (QID) | ORAL | 0 refills | Status: AC

## 2023-10-11 NOTE — Discharge Instructions (Signed)

## 2023-10-11 NOTE — Progress Notes (Signed)
 CSW placed call to [facility] CPS to inquire about status of CPS report made 2023/06/05 due to infant's positive UDS. Per [facility] CPS social worker, [place], the CPS report was screened out.   No barriers to infant's discharge at this time.  Signed,  Sharyne LOIS Roulette, MSW, LCSWA, LCASA 02/18/24 9:07 AM

## 2023-10-11 NOTE — Progress Notes (Signed)
 Pt had been discharged after reassuring exam by CNM Smith this morning.  However, pt told nursing at time of d/c counseling that she had been throwing up blood since about 0200 this morning.  Nursing informed me as per their report no one was notified including the partner who was staying with her during this time.  I confirmed with CNM Claudene that she was not aware of this and she confirms that this is first reports of such.  Went to bedside and patient appears to be at baseline.  Pt reports she knows it was blood cause she could takes the metallic taste in her mouth the single time she threw up.  She states she simply forgot to tell her husband or any of the nursing / provider staff.  PE reassuring, VSS, and SORA.  Chart review shows stable Hgb. Agreed to send patient with PPI prescription and referral to OP GI in case it recurred.  Pt understanding and agreeable, w/o further questions.  Followed up with nursing and no other reports of vomiting / nausea or hematemesis prior to discharge.   Augustin Slade, MD Family Medicine - Obstetrics Fellow

## 2023-10-16 ENCOUNTER — Encounter: Admitting: Women's Health

## 2023-10-16 ENCOUNTER — Telehealth

## 2023-10-21 ENCOUNTER — Telehealth (HOSPITAL_COMMUNITY): Payer: Self-pay | Admitting: *Deleted

## 2023-10-21 NOTE — Telephone Encounter (Signed)
 10/21/2023  Name: Beyonka Pitney MRN: [id number] DOB: [birth year]  Reason for Call:  Transition of Care Hospital Discharge Call  Contact Status: Patient Contact Status: Unable to contact (voice mail box not set up yet, unable to leave a message)  Language assistant needed:          Follow-Up Questions:    Van Postnatal Depression Scale:  In the Past 7 Days:    PHQ2-9 Depression Scale:     Discharge Follow-up:    Post-discharge interventions: NA  Mliss Sieve, RN 10/21/2023 15:17

## 2023-10-23 ENCOUNTER — Encounter: Admitting: Advanced Practice Midwife

## 2023-10-28 ENCOUNTER — Ambulatory Visit: Payer: Self-pay | Admitting: *Deleted

## 2023-10-28 NOTE — Telephone Encounter (Signed)
 FYI Only or Action Required?: Action required by provider: request for appointment, clinical question for provider, and s/p gave birth 17 days ago .  Patient was last seen in primary care on 06/30/23 [place], [place].  Called Nurse Triage reporting Sore Throat.  Symptoms began several days ago.  Interventions attempted: Rest, hydration, or home remedies.  Symptoms are: gradually worsening.  Triage Disposition: See PCP When Office is Open (Within 3 Days)  Patient/caregiver understands and will follow disposition?: Yes              Copied from CRM [id number]. Topic: Clinical - Red Word Triage >> Oct 28, 2023 11:40 AM Larissa RAMAN wrote: [facility] that prompted transfer to Nurse Triage: chest pain, headache, sore throat x3 days Reason for Disposition  [1] Sore throat with cough/cold symptoms AND [2] present > 5 days    Sx present x 3 days  Answer Assessment - Initial Assessment Questions Earliest appt 10/30/23 scheduled. None available with PCP until Friday. Please advise is any earlier appt due to patient is breast feeding and s/p gave birth 17 days ago .        1. ONSET: When did the throat start hurting? (Hours or days ago)      3 days  2. SEVERITY: How bad is the sore throat? (Scale 1-10; mild, moderate or severe)     Getting worse  3. STREP EXPOSURE: Has there been any exposure to strep within the past week? If Yes, ask: What type of contact occurred?      na 4.  VIRAL SYMPTOMS: Are there any symptoms of a cold, such as a runny nose, cough, hoarse voice or red eyes?      Runny nose, green mucus, sore throat, headache frontal area sharp pain at times. Chest pain with coughing , dry cough  5. FEVER: Do you have a fever? If Yes, ask: What is your temperature, how was it measured, and when did it start?     Na  6. PUS ON THE TONSILS: Is there pus on the tonsils in the back of your throat?     na 7. OTHER SYMPTOMS: Do you have any other symptoms? (e.g.,  difficulty breathing, headache, rash)     Chest pain from coughing, sore throat headache s/p gave birth 17 days ago  8. PREGNANCY: Is there any chance you are pregnant? When was your last menstrual period?    Had baby 17 days ago  Protocols used: Sore Throat-A-AH

## 2023-10-30 ENCOUNTER — Encounter: Payer: Self-pay | Admitting: Nurse Practitioner

## 2023-10-30 ENCOUNTER — Encounter: Admitting: Women's Health

## 2023-10-30 ENCOUNTER — Other Ambulatory Visit

## 2023-10-30 ENCOUNTER — Ambulatory Visit (INDEPENDENT_AMBULATORY_CARE_PROVIDER_SITE_OTHER): Admitting: Nurse Practitioner

## 2023-10-30 VITALS — BP 124/72 | HR 104 | Temp 97.7°F | Ht 68.0 in | Wt 209.0 lb

## 2023-10-30 DIAGNOSIS — F332 Major depressive disorder, recurrent severe without psychotic features: Secondary | ICD-10-CM

## 2023-10-30 DIAGNOSIS — F53 Postpartum depression: Secondary | ICD-10-CM | POA: Diagnosis not present

## 2023-10-30 DIAGNOSIS — J069 Acute upper respiratory infection, unspecified: Secondary | ICD-10-CM

## 2023-10-30 MED ORDER — SERTRALINE HCL 100 MG PO TABS: 100.0000 mg | ORAL_TABLET | Freq: Every day | ORAL | 0 refills | Status: AC

## 2023-10-30 MED ORDER — AMOXICILLIN 500 MG PO TABS
500.0000 mg | ORAL_TABLET | Freq: Three times a day (TID) | ORAL | 0 refills | Status: DC
Start: 2023-10-30 — End: 2024-02-25

## 2023-10-30 NOTE — Progress Notes (Signed)
 Subjective:    Patient ID: Pamela Mccarty, female    DOB: [birth year], 28 y.o.   MRN: [id number]  HPI Sore throat, coughing up green , chest heaviness x 4 days No fever - gave birth recently Discuss trazodone  side effects - nightmares Presents for complaints of cold symptoms for the past 4 days.  No fever.  Sore throat.  Cough at times, occasionally producing green mucus.  Slight heaviness in the chest, slight pain mainly with cough.  No wheezing.  No ear pain.  Patient does not smoke or vape.  No nausea vomiting or diarrhea.  Taking fluids well.  Voiding normal limit.  Gave birth recently.  Is currently breast-feeding.  Patient has been struggling with depression including postpartum depression.  Continues to have significant issues with sleep.  Was placed on sertraline  50 mg but would like to go up on the dose.  Stopped trazodone  due to significant nightmares.  Has been taking OTC Mucinex for her congestion.  Review of Systems See HPI       10/30/2023    1:18 PM  Depression screen PHQ 2/9  Decreased Interest 0  Down, Depressed, Hopeless 0  PHQ - 2 Score 0  Altered sleeping 1  Tired, decreased energy 0  Change in appetite 1  Feeling bad or failure about yourself  0  Trouble concentrating 0  Moving slowly or fidgety/restless 0  Suicidal thoughts 0  PHQ-9 Score 2  Difficult doing work/chores Somewhat difficult      10/30/2023    1:19 PM 08/07/2023    9:54 AM 06/30/2023    8:38 AM 05/15/2023   10:59 AM  GAD 7 : Generalized Anxiety Score  Nervous, Anxious, on Edge 0 0 0 0  Control/stop worrying 0 0 0 0  Worry too much - different things 0 0 0 0  Trouble relaxing 0 0 0 0  Restless 0 0 0 0  Easily annoyed or irritable 0 0 0 1  Afraid - awful might happen 0 0 0 0  Total GAD 7 Score 0 0 0 1  Anxiety Difficulty Not difficult at all  Not difficult at all      Objective:   Physical Exam Vitals and nursing note reviewed.  Constitutional:      General: She is not in acute  distress. HENT:     Ears:     Comments: TMs mild clear effusion, no erythema.    Mouth/Throat:     Mouth: Mucous membranes are moist.     Comments: Posterior pharynx minimally injected with cloudy PND noted. Neck:     Comments: Mild anterior cervical adenopathy. Cardiovascular:     Rate and Rhythm: Normal rate and regular rhythm.  Pulmonary:     Effort: Pulmonary effort is normal.     Breath sounds: Normal breath sounds.  Musculoskeletal:     Cervical back: Neck supple.  Lymphadenopathy:     Cervical: Cervical adenopathy present.  Neurological:     Mental Status: She is alert.  Psychiatric:        Mood and Affect: Mood normal.        Behavior: Behavior normal.        Thought Content: Thought content normal.        Judgment: Judgment normal.     Comments: Calm affect.  Making good eye contact.  Speech clear.    Today's Vitals   10/30/23 1304  BP: 124/72  Pulse: (!) 104  Temp: 97.7 F (36.5 C)  SpO2: 98%  Weight: 209 lb (94.8 kg)  Height: 5' 8 (1.727 m)   Body mass index is 31.78 kg/m.        Assessment & Plan:   Problem List Items Addressed This Visit       Other   MDD (major depressive disorder), recurrent severe, without psychosis (HCC)   Relevant Medications   sertraline  (ZOLOFT ) 100 MG tablet   Post partum depression   Relevant Medications   sertraline  (ZOLOFT ) 100 MG tablet   Other Visit Diagnoses       Viral URI with cough    -  Primary      Meds ordered this encounter  Medications   amoxicillin  (AMOXIL ) 500 MG tablet    Sig: Take 1 tablet (500 mg total) by mouth 3 (three) times daily.    Dispense:  21 tablet    Refill:  0    Please hold unless patient calls    Supervising Provider:   ALPHONSA HAMILTON A [[id number]]   sertraline  (ZOLOFT ) 100 MG tablet    Sig: Take 1 tablet (100 mg total) by mouth daily.    Dispense:  30 tablet    Refill:  0    Supervising Provider:   ALPHONSA HAMILTON A [[id number]]   Increase sertraline  to 100 mg daily.  Reviewed  potential adverse effects including box warnings.  Go back to previous dose and contact office if any problems.  Contact our office within the next month to let us  know how the current dose is working. Continue Mucinex as directed.  A prescription was sent in for amoxicillin  to put on hold for the weekend in case symptoms worsen.  Warning signs were reviewed.  Call back if worsens or persist. Patient had a question about taking melatonin for sleep but a review of the literature indicates that this should not be taken since she is breast-feeding.  Patient verbalizes understanding.

## 2023-10-31 DIAGNOSIS — F53 Postpartum depression: Secondary | ICD-10-CM | POA: Insufficient documentation

## 2023-11-01 ENCOUNTER — Encounter: Payer: Self-pay | Admitting: Nurse Practitioner

## 2023-11-19 ENCOUNTER — Ambulatory Visit: Admitting: Women's Health

## 2023-11-26 DIAGNOSIS — R7989 Other specified abnormal findings of blood chemistry: Secondary | ICD-10-CM | POA: Diagnosis not present

## 2023-11-26 DIAGNOSIS — Z711 Person with feared health complaint in whom no diagnosis is made: Secondary | ICD-10-CM | POA: Diagnosis not present

## 2023-12-31 ENCOUNTER — Ambulatory Visit: Admitting: Physician Assistant

## 2024-02-23 ENCOUNTER — Encounter (HOSPITAL_COMMUNITY): Payer: Self-pay | Admitting: Emergency Medicine

## 2024-02-23 ENCOUNTER — Emergency Department (HOSPITAL_COMMUNITY)
Admission: EM | Admit: 2024-02-23 | Discharge: 2024-02-23 | Disposition: A | Discharge status: Home / Self Care | Attending: Emergency Medicine | Admitting: Emergency Medicine

## 2024-02-23 ENCOUNTER — Emergency Department (HOSPITAL_COMMUNITY)

## 2024-02-23 ENCOUNTER — Other Ambulatory Visit: Payer: Self-pay

## 2024-02-23 DIAGNOSIS — K920 Hematemesis: Secondary | ICD-10-CM | POA: Diagnosis not present

## 2024-02-23 DIAGNOSIS — R1013 Epigastric pain: Secondary | ICD-10-CM | POA: Diagnosis not present

## 2024-02-23 DIAGNOSIS — K6389 Other specified diseases of intestine: Secondary | ICD-10-CM | POA: Diagnosis not present

## 2024-02-23 DIAGNOSIS — R109 Unspecified abdominal pain: Secondary | ICD-10-CM | POA: Diagnosis not present

## 2024-02-23 LAB — CBC WITH DIFFERENTIAL/PLATELET
Abs Immature Granulocytes: 0.02 K/uL (ref 0.00–0.07)
Basophils Absolute: 0 K/uL (ref 0.0–0.1)
Basophils Relative: 0 %
Eosinophils Absolute: 0.1 K/uL (ref 0.0–0.5)
Eosinophils Relative: 2 %
HCT: 45.5 % (ref 36.0–46.0)
Hemoglobin: 15.4 g/dL — ABNORMAL HIGH (ref 12.0–15.0)
Immature Granulocytes: 0 %
Lymphocytes Relative: 21 %
Lymphs Abs: 1.7 K/uL (ref 0.7–4.0)
MCH: 30 pg (ref 26.0–34.0)
MCHC: 33.8 g/dL (ref 30.0–36.0)
MCV: 88.5 fL (ref 80.0–100.0)
Monocytes Absolute: 0.5 K/uL (ref 0.1–1.0)
Monocytes Relative: 7 %
Neutro Abs: 5.7 K/uL (ref 1.7–7.7)
Neutrophils Relative %: 70 %
Platelets: 191 K/uL (ref 150–400)
RBC: 5.14 MIL/uL — ABNORMAL HIGH (ref 3.87–5.11)
RDW: 12 % (ref 11.5–15.5)
Smear Review: NORMAL
WBC: 8.1 K/uL (ref 4.0–10.5)
nRBC: 0 % (ref 0.0–0.2)

## 2024-02-23 LAB — URINALYSIS, ROUTINE W REFLEX MICROSCOPIC
Bilirubin Urine: NEGATIVE
Glucose, UA: NEGATIVE mg/dL
Hgb urine dipstick: NEGATIVE
Ketones, ur: NEGATIVE mg/dL
Leukocytes,Ua: NEGATIVE
Nitrite: NEGATIVE
Protein, ur: NEGATIVE mg/dL
Specific Gravity, Urine: 1.029 (ref 1.005–1.030)
pH: 5 (ref 5.0–8.0)

## 2024-02-23 LAB — COMPREHENSIVE METABOLIC PANEL WITH GFR
ALT: 14 U/L (ref 0–44)
AST: 19 U/L (ref 15–41)
Albumin: 4.2 g/dL (ref 3.5–5.0)
Alkaline Phosphatase: 68 U/L (ref 38–126)
Anion gap: 11 (ref 5–15)
BUN: 15 mg/dL (ref 6–20)
CO2: 22 mmol/L (ref 22–32)
Calcium: 8.7 mg/dL — ABNORMAL LOW (ref 8.9–10.3)
Chloride: 107 mmol/L (ref 98–111)
Creatinine, Ser: 0.77 mg/dL (ref 0.44–1.00)
GFR, Estimated: >60 mL/min (ref >60)
Glucose, Bld: 83 mg/dL (ref 70–99)
Potassium: 3.8 mmol/L (ref 3.5–5.1)
Sodium: 140 mmol/L (ref 135–145)
Total Bilirubin: 0.5 mg/dL (ref 0.0–1.2)
Total Protein: 6.8 g/dL (ref 6.5–8.1)

## 2024-02-23 LAB — MAGNESIUM: Magnesium: 2.3 mg/dL (ref 1.7–2.4)

## 2024-02-23 LAB — PROTIME-INR
INR: 1 (ref 0.8–1.2)
Prothrombin Time: 13.6 s (ref 11.4–15.2)

## 2024-02-23 LAB — LIPASE, BLOOD: Lipase: 24 U/L (ref 11–51)

## 2024-02-23 LAB — POC URINE PREG, ED: Preg Test, Ur: NEGATIVE

## 2024-02-23 LAB — HCG, SERUM, QUALITATIVE: Preg, Serum: NEGATIVE

## 2024-02-23 MED ORDER — IOHEXOL 300 MG/ML  SOLN
100.0000 mL | Freq: Once | INTRAMUSCULAR | Status: AC | PRN
  Administered 2024-02-23: 100 mL via INTRAVENOUS

## 2024-02-23 MED ORDER — HYDROMORPHONE HCL 1 MG/ML IJ SOLN
0.5000 mg | Freq: Once | INTRAMUSCULAR | Status: AC
  Administered 2024-02-23: 0.5 mg via INTRAVENOUS
  Filled 2024-02-23: qty 0.5

## 2024-02-23 MED ORDER — PANTOPRAZOLE SODIUM 20 MG PO TBEC
20.0000 mg | DELAYED_RELEASE_TABLET | Freq: Every day | ORAL | 0 refills | Status: AC
Start: 2024-02-23 — End: ?

## 2024-02-23 MED ORDER — LACTATED RINGERS IV BOLUS
1000.0000 mL | Freq: Once | INTRAVENOUS | Status: AC
  Administered 2024-02-23: 1000 mL via INTRAVENOUS

## 2024-02-23 MED ORDER — ALUM & MAG HYDROXIDE-SIMETH 200-200-20 MG/5ML PO SUSP
30.0000 mL | Freq: Once | ORAL | Status: AC
  Administered 2024-02-23: 30 mL via ORAL
  Filled 2024-02-23: qty 30

## 2024-02-23 MED ORDER — METOCLOPRAMIDE HCL 5 MG/ML IJ SOLN
10.0000 mg | Freq: Once | INTRAMUSCULAR | Status: AC
  Administered 2024-02-23: 10 mg via INTRAVENOUS
  Filled 2024-02-23: qty 2

## 2024-02-23 MED ORDER — METOCLOPRAMIDE HCL 10 MG PO TABS: 10.0000 mg | ORAL_TABLET | Freq: Four times a day (QID) | ORAL | 0 refills | Status: AC | PRN

## 2024-02-23 MED ORDER — LIDOCAINE VISCOUS HCL 2 % MT SOLN
15.0000 mL | Freq: Once | OROMUCOSAL | Status: AC
  Administered 2024-02-23: 15 mL via ORAL
  Filled 2024-02-23: qty 15

## 2024-02-23 MED ORDER — PANTOPRAZOLE SODIUM 40 MG IV SOLR
40.0000 mg | Freq: Two times a day (BID) | INTRAVENOUS | Status: DC
  Administered 2024-02-23: 40 mg via INTRAVENOUS
  Filled 2024-02-23: qty 10

## 2024-02-23 NOTE — ED Provider Notes (Signed)
 [place] EMERGENCY DEPARTMENT AT [facility] Provider Note   CSN: [id number] Arrival date & time: 02/23/24  9248     Patient presents with: Abdominal Pain   Pamela Mccarty is a 28 y.o. female.   HPI Patient presents for abdominal pain, nausea, vomiting.  Medical history includes anxiety, depression, epilepsy, asthma.  She is currently 4 months postpartum.  Yesterday morning, she was in her normal state of health.  In the afternoon, she developed a burning pain in the bilateral aspects of her abdomen.  Pain progressed to become a sharp pain in her epigastrium.  It does radiate to her mid back.  She developed nausea and vomiting.  She describes large-volume frank hematemesis.  This morning, she had ongoing hematemesis and had a syncopal episode in the bathroom.  Currently, pain is 10/10 in severity.  She does endorse ongoing nausea.  She has had diarrhea which is described as watery and nonbloody.  LMP was 3 weeks ago.    Prior to Admission medications   Medication Sig Start Date End Date Taking? Authorizing Provider  metoCLOPramide (REGLAN) 10 MG tablet Take 1 tablet (10 mg total) by mouth every 6 (six) hours as needed for nausea. 02/23/24  Yes Melvenia Motto, MD  pantoprazole (PROTONIX) 20 MG tablet Take 1 tablet (20 mg total) by mouth daily. 02/23/24  Yes Melvenia Motto, MD  amoxicillin  (AMOXIL ) 500 MG tablet Take 1 tablet (500 mg total) by mouth 3 (three) times daily. 10/30/23   Mauro Elveria BROCKS, NP  furosemide  (LASIX ) 20 MG tablet Take 1 tablet (20 mg total) by mouth daily. 10/11/23   Smith, [place] , CNM  ibuprofen  (ADVIL ) 600 MG tablet Take 1 tablet (600 mg total) by mouth every 6 (six) hours. 10/11/23   Smith, [place] , CNM  NIFEdipine  (ADALAT  CC) 30 MG 24 hr tablet Take 1 tablet (30 mg total) by mouth daily. 10/11/23   Smith, Virginia , CNM  potassium chloride  SA (KLOR-CON  M) 20 MEQ tablet Take 2 tablets (40 mEq total) by mouth daily. 10/11/23   Smith, Virginia , CNM  prenatal  vitamin w/FE, FA (PRENATAL 1 + 1) 27-1 MG TABS tablet Take 1 tablet by mouth daily at 12 noon. 05/15/23   Kizzie Suzen SAUNDERS, CNM  sertraline  (ZOLOFT ) 100 MG tablet Take 1 tablet (100 mg total) by mouth daily. 10/30/23   Hoskins, Carolyn C, NP    Allergies: Peanut-containing drug products, Latex, Soy allergy (obsolete), Tomato, and Trazodone  and nefazodone    Review of Systems  Gastrointestinal:  Positive for abdominal pain, diarrhea, nausea and vomiting.  All other systems reviewed and are negative.   Updated Vital Signs BP 118/72   Pulse (!) 57   Temp 98.2 F (36.8 C) (Oral)   Resp 17   Ht 5' 8 (1.727 m)   Wt 102.1 kg   LMP 01/27/2024   SpO2 93%   BMI 34.21 kg/m   Physical Exam Vitals and nursing note reviewed.  Constitutional:      General: She is not in acute distress.    Appearance: Normal appearance. She is well-developed. She is not ill-appearing, toxic-appearing or diaphoretic.  HENT:     Head: Normocephalic and atraumatic.     Right Ear: External ear normal.     Left Ear: External ear normal.     Nose: Nose normal.     Mouth/Throat:     Mouth: Mucous membranes are moist.  Eyes:     Extraocular Movements: Extraocular movements intact.     Conjunctiva/sclera:  Conjunctivae normal.  Cardiovascular:     Rate and Rhythm: Normal rate and regular rhythm.  Pulmonary:     Effort: Pulmonary effort is normal. No respiratory distress.  Abdominal:     General: There is no distension.     Palpations: Abdomen is soft.     Tenderness: There is abdominal tenderness. There is no guarding or rebound.  Musculoskeletal:        General: No swelling. Normal range of motion.     Cervical back: Normal range of motion and neck supple.  Skin:    General: Skin is warm and dry.     Coloration: Skin is not jaundiced or pale.  Neurological:     General: No focal deficit present.     Mental Status: She is alert and oriented to person, place, and time.  Psychiatric:        Mood and  Affect: Mood normal.        Behavior: Behavior normal.     (all labs ordered are listed, but only abnormal results are displayed) Labs Reviewed  COMPREHENSIVE METABOLIC PANEL WITH GFR - Abnormal; Notable for the following components:      Result Value   Calcium  8.7 (*)    All other components within normal limits  CBC WITH DIFFERENTIAL/PLATELET - Abnormal; Notable for the following components:   RBC 5.14 (*)    Hemoglobin 15.4 (*)    All other components within normal limits  URINALYSIS, ROUTINE W REFLEX MICROSCOPIC - Abnormal; Notable for the following components:   APPearance HAZY (*)    All other components within normal limits  LIPASE, BLOOD  PROTIME-INR  MAGNESIUM   HCG, SERUM, QUALITATIVE  POC URINE PREG, ED    EKG: EKG Interpretation Date/Time:  Monday February 23 2024 08:46:14 EST Ventricular Rate:  82 PR Interval:  130 QRS Duration:  84 QT Interval:  377 QTC Calculation: 441 R Axis:   73  Text Interpretation: Sinus rhythm RSR' in V1 or V2, right VCD or RVH Confirmed by Melvenia Motto ([id number]) on 02/23/2024 8:53:00 AM  Radiology: CT ABDOMEN PELVIS W CONTRAST Result Date: 02/23/2024 CLINICAL DATA:  Abdominal pain, acute, nonlocalized EXAM: CT ABDOMEN AND PELVIS WITH CONTRAST TECHNIQUE: Multidetector CT imaging of the abdomen and pelvis was performed using the standard protocol following bolus administration of intravenous contrast. RADIATION DOSE REDUCTION: This exam was performed according to the departmental dose-optimization program which includes automated exposure control, adjustment of the mA and/or kV according to patient size and/or use of iterative reconstruction technique. CONTRAST:  OMNIPAQUE  IOHEXOL  300 MG/ML  SOLN COMPARISON:  CT abdomen/pelvis dated 11/22/2022. FINDINGS: Lower chest: No acute abnormality. Hepatobiliary: No focal liver abnormality is seen. No gallstones, gallbladder wall thickening, or biliary dilatation. Pancreas: Unremarkable. No  pancreatic ductal dilatation or surrounding inflammatory changes. Spleen: Normal in size without focal abnormality. Adrenals/Urinary Tract: Adrenal glands are unremarkable. Kidneys are normal, without renal calculi, focal lesion, or hydronephrosis. Bladder is unremarkable. Stomach/Bowel: Stomach is within normal limits. No obstruction or inflammatory changes. Appendix appears normal. Mildly distended rectosigmoid colon with air-fluid levels. Vascular/Lymphatic: No significant vascular findings are present. No enlarged abdominal or pelvic lymph nodes. Reproductive: Anteverted uterus.  Bilateral adnexa are unremarkable. Other: No abdominopelvic ascites.  No intraperitoneal free air. Musculoskeletal: No acute or significant osseous findings. IMPRESSION: 1. No acute localizing findings in the abdomen or pelvis. 2. Mildly distended rectosigmoid colon with air-fluid levels, as can be seen in the setting of diarrheal illness. Electronically Signed   By: Harrietta  Lateef M.D.   On: 02/23/2024 10:54   DG Abd Acute W/Chest Result Date: 02/23/2024 . REPORT EXAM: UPRIGHT AND SUPINE XRAY VIEWS OF THE ABDOMEN AND 4 OR 2 OR 1 VIEW(S) OF THE CHEST 02/23/2024 08:34:00 AM COMPARISON: None available. CLINICAL HISTORY: Hematemesis FINDINGS: LUNGS AND PLEURA: No consolidation or pulmonary edema. No pleural effusion or pneumothorax. HEART AND MEDIASTINUM: No acute abnormality of the cardiac and mediastinal silhouettes. BOWEL: The bowel gas pattern is nonspecific. No bowel obstruction. PERITONEUM AND SOFT TISSUES: No abnormal calcifications. No free air. BONES: No acute osseous abnormality. IMPRESSION: 1. No  bowel obstruction or free air. 2. No acute process in the lungs. Electronically signed by: Waddell Calk MD 02/23/2024 08:57 AM EST RP Workstation: [id number]     Procedures   Medications Ordered in the ED  pantoprazole (PROTONIX) injection 40 mg (40 mg Intravenous Given 02/23/24 0835)  HYDROmorphone  (DILAUDID ) injection  0.5 mg (0.5 mg Intravenous Given 02/23/24 0839)  metoCLOPramide (REGLAN) injection 10 mg (10 mg Intravenous Given 02/23/24 0838)  lactated ringers  bolus 1,000 mL (0 mLs Intravenous Stopped 02/23/24 0929)  iohexol  (OMNIPAQUE ) 300 MG/ML solution 100 mL (100 mLs Intravenous Contrast Given 02/23/24 1011)  alum & mag hydroxide-simeth (MAALOX/MYLANTA) 200-200-20 MG/5ML suspension 30 mL (30 mLs Oral Given 02/23/24 1128)    And  lidocaine  (XYLOCAINE ) 2 % viscous mouth solution 15 mL (15 mLs Oral Given 02/23/24 1128)                                    Medical Decision Making Amount and/or Complexity of Data Reviewed Labs: ordered. Radiology: ordered.  Risk OTC drugs. Prescription drug management.   This patient presents to the ED for concern of abdominal pain, this involves an extensive number of treatment options, and is a complaint that carries with it a high risk of complications and morbidity.  The differential diagnosis includes gastritis, PUD, pancreatitis, cholecystitis, enteritis   Co morbidities / Chronic conditions that complicate the patient evaluation  anxiety, depression, epilepsy, asthma   Additional history obtained:  Additional history obtained from EMR External records from outside source obtained and reviewed including N/A   Lab Tests:  I Ordered, and personally interpreted labs.  The pertinent results include: Normal hemoglobin, no leukocytosis, normal kidney function, normal electrolytes, normal hepatobiliary enzymes, normal lipase   Imaging Studies ordered:  I ordered imaging studies including CT of abdomen and pelvis I independently visualized and interpreted imaging which showed no acute findings I agree with the radiologist interpretation   Cardiac Monitoring: / EKG:  The patient was maintained on a cardiac monitor.  I personally viewed and interpreted the cardiac monitored which showed an underlying rhythm of: Sinus rhythm   Problem List / ED  Course / Critical interventions / Medication management  Patient presenting for acute onset of epigastric pain yesterday afternoon with subsequent episodes of nausea, vomiting, diarrhea.  Vomitus is described as bloody.  Vital signs on arrival in the ED are reassuring.  Patient is overall well-appearing on exam.  She does have a generalized tenderness to palpation in her abdomen.  Workup was initiated.  Given concern for PUD, Protonix initiated.  Lab work is reassuring with hemoglobin of 15.4.  Patient had improved nausea after dose of Reglan.  After she received IV contrast, she did have recurrence of nausea and vomiting.  Her vomitus in the ED was normal in color with slight red streaks.  Hematemesis does  seem to be improving.  On reassessment, she endorses some ongoing epigastric discomfort and nausea.  GI cocktail was ordered.  I spoke with gastroenterologist on-call, Dr. Cindie, who feels that patient can be seen in in the office.  Dr. Tonie office to reach out to patient to get seen this week.  Patient is agreeable with that plan.  Patient was discharged in stable condition. I ordered medication including IV fluids for hydration, Dilaudid  and GI cocktail for analgesia, Reglan for nausea Reevaluation of the patient after these medicines showed that the patient improved I have reviewed the patients home medicines and have made adjustments as needed   Consultations Obtained:  I requested consultation with the gastroenterologist, Dr. Cindie,  and discussed lab and imaging findings as well as pertinent plan - they recommend: Outpatient follow-up   Social Determinants of Health:  Lives independently     Final diagnoses:  Epigastric pain    ED Discharge Orders          Ordered    pantoprazole (PROTONIX) 20 MG tablet  Daily        02/23/24 1154    metoCLOPramide (REGLAN) 10 MG tablet  Every 6 hours PRN        02/23/24 1154               Melvenia Motto, MD 02/23/24 1156

## 2024-02-23 NOTE — ED Triage Notes (Signed)
 Pt presents with generalized abdominal pain, with vomiting and diarrhea since last night.

## 2024-02-23 NOTE — Discharge Instructions (Addendum)
 Your test results today were reassuring.  Prescriptions were sent to your pharmacy.  Take Protonix daily to help promote healing of your stomach wall.  Take Reglan as needed for nausea.  Take Tylenol  as needed for pain.  You can also take over-the-counter Maalox or Mylanta as needed for abdominal discomfort.  Avoid alcohol, tobacco, and NSAID medications.  You should hear from gastroenterology office to set up a close follow-up appointment.  If you do not hear from them by tomorrow, call the telephone number below.  Return to the emergency department for any new or worsening symptoms of concern.

## 2024-02-24 ENCOUNTER — Telehealth: Payer: Self-pay | Admitting: Internal Medicine

## 2024-02-24 NOTE — Telephone Encounter (Signed)
 Called patient to schedule a hospital follow up. Went to a voicemail that is not set up.

## 2024-02-25 ENCOUNTER — Emergency Department (HOSPITAL_COMMUNITY)
Admission: EM | Admit: 2024-02-25 | Discharge: 2024-02-25 | Disposition: A | Discharge status: Home / Self Care | Attending: Emergency Medicine | Admitting: Emergency Medicine

## 2024-02-25 ENCOUNTER — Emergency Department (HOSPITAL_COMMUNITY)

## 2024-02-25 ENCOUNTER — Other Ambulatory Visit: Payer: Self-pay

## 2024-02-25 ENCOUNTER — Encounter (HOSPITAL_COMMUNITY): Payer: Self-pay | Admitting: Emergency Medicine

## 2024-02-25 DIAGNOSIS — D72829 Elevated white blood cell count, unspecified: Secondary | ICD-10-CM | POA: Insufficient documentation

## 2024-02-25 DIAGNOSIS — R112 Nausea with vomiting, unspecified: Secondary | ICD-10-CM | POA: Diagnosis not present

## 2024-02-25 DIAGNOSIS — R197 Diarrhea, unspecified: Secondary | ICD-10-CM | POA: Insufficient documentation

## 2024-02-25 DIAGNOSIS — R1084 Generalized abdominal pain: Secondary | ICD-10-CM | POA: Diagnosis not present

## 2024-02-25 DIAGNOSIS — R109 Unspecified abdominal pain: Secondary | ICD-10-CM | POA: Diagnosis not present

## 2024-02-25 DIAGNOSIS — Z79899 Other long term (current) drug therapy: Secondary | ICD-10-CM | POA: Insufficient documentation

## 2024-02-25 DIAGNOSIS — Z91040 Latex allergy status: Secondary | ICD-10-CM | POA: Insufficient documentation

## 2024-02-25 DIAGNOSIS — J45909 Unspecified asthma, uncomplicated: Secondary | ICD-10-CM | POA: Insufficient documentation

## 2024-02-25 DIAGNOSIS — R1013 Epigastric pain: Secondary | ICD-10-CM | POA: Diagnosis not present

## 2024-02-25 LAB — COMPREHENSIVE METABOLIC PANEL WITH GFR
ALT: 15 U/L (ref 0–44)
AST: 15 U/L (ref 15–41)
Albumin: 4.5 g/dL (ref 3.5–5.0)
Alkaline Phosphatase: 69 U/L (ref 38–126)
Anion gap: 11 (ref 5–15)
BUN: 13 mg/dL (ref 6–20)
CO2: 22 mmol/L (ref 22–32)
Calcium: 9.1 mg/dL (ref 8.9–10.3)
Chloride: 105 mmol/L (ref 98–111)
Creatinine, Ser: 0.68 mg/dL (ref 0.44–1.00)
GFR, Estimated: >60 mL/min (ref >60)
Glucose, Bld: 80 mg/dL (ref 70–99)
Potassium: 4 mmol/L (ref 3.5–5.1)
Sodium: 139 mmol/L (ref 135–145)
Total Bilirubin: 0.4 mg/dL (ref 0.0–1.2)
Total Protein: 7.2 g/dL (ref 6.5–8.1)

## 2024-02-25 LAB — URINALYSIS, ROUTINE W REFLEX MICROSCOPIC
Bilirubin Urine: NEGATIVE
Glucose, UA: NEGATIVE mg/dL
Hgb urine dipstick: NEGATIVE
Ketones, ur: 5 mg/dL — AB
Nitrite: NEGATIVE
Protein, ur: NEGATIVE mg/dL
Specific Gravity, Urine: 1.023 (ref 1.005–1.030)
pH: 5 (ref 5.0–8.0)

## 2024-02-25 LAB — LIPASE, BLOOD: Lipase: 21 U/L (ref 11–51)

## 2024-02-25 LAB — CBC WITH DIFFERENTIAL/PLATELET
Abs Immature Granulocytes: 0.03 K/uL (ref 0.00–0.07)
Basophils Absolute: 0 K/uL (ref 0.0–0.1)
Basophils Relative: 0 %
Eosinophils Absolute: 0.1 K/uL (ref 0.0–0.5)
Eosinophils Relative: 1 %
HCT: 46.7 % — ABNORMAL HIGH (ref 36.0–46.0)
Hemoglobin: 16.4 g/dL — ABNORMAL HIGH (ref 12.0–15.0)
Immature Granulocytes: 0 %
Lymphocytes Relative: 12 %
Lymphs Abs: 1.4 K/uL (ref 0.7–4.0)
MCH: 31.1 pg (ref 26.0–34.0)
MCHC: 35.1 g/dL (ref 30.0–36.0)
MCV: 88.4 fL (ref 80.0–100.0)
Monocytes Absolute: 0.7 K/uL (ref 0.1–1.0)
Monocytes Relative: 6 %
Neutro Abs: 9.4 K/uL — ABNORMAL HIGH (ref 1.7–7.7)
Neutrophils Relative %: 81 %
Platelets: 253 K/uL (ref 150–400)
RBC: 5.28 MIL/uL — ABNORMAL HIGH (ref 3.87–5.11)
RDW: 12 % (ref 11.5–15.5)
WBC: 11.7 K/uL — ABNORMAL HIGH (ref 4.0–10.5)
nRBC: 0 % (ref 0.0–0.2)

## 2024-02-25 LAB — SAMPLE TO BLOOD BANK

## 2024-02-25 LAB — HCG, SERUM, QUALITATIVE: Preg, Serum: NEGATIVE

## 2024-02-25 MED ORDER — DIPHENOXYLATE-ATROPINE 2.5-0.025 MG PO TABS
2.0000 | ORAL_TABLET | Freq: Once | ORAL | Status: AC
  Administered 2024-02-25: 2 via ORAL
  Filled 2024-02-25: qty 2

## 2024-02-25 MED ORDER — DIPHENOXYLATE-ATROPINE 2.5-0.025 MG PO TABS: 1.0000 | ORAL_TABLET | Freq: Four times a day (QID) | ORAL | 0 refills | Status: AC | PRN

## 2024-02-25 MED ORDER — ONDANSETRON HCL 4 MG/2ML IJ SOLN
4.0000 mg | Freq: Once | INTRAMUSCULAR | Status: AC
  Administered 2024-02-25: 4 mg via INTRAVENOUS
  Filled 2024-02-25: qty 2

## 2024-02-25 MED ORDER — PANTOPRAZOLE SODIUM 40 MG IV SOLR
40.0000 mg | Freq: Once | INTRAVENOUS | Status: AC
  Administered 2024-02-25: 40 mg via INTRAVENOUS
  Filled 2024-02-25: qty 10

## 2024-02-25 MED ORDER — SODIUM CHLORIDE 0.9 % IV BOLUS
1000.0000 mL | Freq: Once | INTRAVENOUS | Status: AC
  Administered 2024-02-25: 1000 mL via INTRAVENOUS

## 2024-02-25 MED ORDER — MORPHINE SULFATE (PF) 4 MG/ML IV SOLN
4.0000 mg | Freq: Once | INTRAVENOUS | Status: AC
  Administered 2024-02-25: 4 mg via INTRAVENOUS
  Filled 2024-02-25: qty 1

## 2024-02-25 MED ORDER — ONDANSETRON HCL 4 MG PO TABS: 4.0000 mg | ORAL_TABLET | Freq: Three times a day (TID) | ORAL | 0 refills | Status: AC | PRN

## 2024-02-25 NOTE — ED Provider Notes (Signed)
 [place] EMERGENCY DEPARTMENT AT [facility] Provider Note   CSN: [id number] Arrival date & time: 02/25/24  1012     Patient presents with: Abdominal Pain   Pamela Mccarty is a 28 y.o. female with a history significant for ADHD, history of seizure disorder, asthma, history of opioid use disorder who is currently 4 months postpartum, not breast-feeding presenting for reevaluation of abdominal pain along with nausea vomiting and diarrhea.  She describes waxing waning burning pain which starts in her bilateral lower abdomen and gradually radiates up into her epigastric area.  She has nausea with emesis, diarrhea occurring approximately every 5-6 hours.  She had had bright red blood in her emesis when she was seen for this same symptom here 2 days ago, however her emesis has become more of a pink-tinged rather than frank blood.  She denies blood in her stools.  She states she had a fever on Monday of 101 but has had no fever since.  She was prescribed Protonix  and Reglan  but is unable to get these filled until Friday (2 days).  She is also scheduled to see GI in 2 days.  Labs and imaging including CT abdomen pelvis from her visit 2 days ago here was reassuring with no obvious source of her symptoms.  She denies any exposure to others with similar symptoms.   The history is provided by the patient.       Prior to Admission medications   Medication Sig Start Date End Date Taking? Authorizing Provider  diphenoxylate -atropine  (LOMOTIL ) 2.5-0.025 MG tablet Take 1 tablet by mouth 4 (four) times daily as needed for diarrhea or loose stools. 02/25/24  Yes Tyffany Waldrop, PA-C  ibuprofen  (ADVIL ) 600 MG tablet Take 1 tablet (600 mg total) by mouth every 6 (six) hours. 10/11/23  Yes Smith, Virginia , CNM  ondansetron  (ZOFRAN ) 4 MG tablet Take 1 tablet (4 mg total) by mouth every 8 (eight) hours as needed for nausea or vomiting. 02/25/24  Yes Malik Paar, PA-C  sertraline  (ZOLOFT ) 100 MG  tablet Take 1 tablet (100 mg total) by mouth daily. 10/30/23  Yes Mauro Elveria BROCKS, NP  metoCLOPramide  (REGLAN ) 10 MG tablet Take 1 tablet (10 mg total) by mouth every 6 (six) hours as needed for nausea. 02/23/24   Melvenia Motto, MD  pantoprazole  (PROTONIX ) 20 MG tablet Take 1 tablet (20 mg total) by mouth daily. 02/23/24   Melvenia Motto, MD    Allergies: Peanut-containing drug products, Latex, Soy allergy (obsolete), Tomato, and Trazodone  and nefazodone    Review of Systems  Constitutional:  Negative for fever.  HENT:  Negative for congestion.   Eyes: Negative.   Respiratory:  Negative for chest tightness and shortness of breath.   Cardiovascular:  Negative for chest pain.  Gastrointestinal:  Positive for abdominal pain, diarrhea, nausea and vomiting.  Genitourinary: Negative.  Negative for dysuria.  Musculoskeletal:  Negative for arthralgias, joint swelling and neck pain.  Skin: Negative.  Negative for rash and wound.  Neurological:  Negative for dizziness, weakness, light-headedness, numbness and headaches.  Psychiatric/Behavioral: Negative.      Updated Vital Signs BP 136/88   Pulse 72   Temp 97.9 F (36.6 C) (Oral)   Resp 19   LMP 01/27/2024   SpO2 99%   Physical Exam Vitals and nursing note reviewed.  Constitutional:      Appearance: She is well-developed.  HENT:     Head: Normocephalic and atraumatic.  Eyes:     Conjunctiva/sclera: Conjunctivae normal.  Cardiovascular:     Rate and Rhythm: Normal rate and regular rhythm.     Heart sounds: Normal heart sounds.  Pulmonary:     Effort: Pulmonary effort is normal.     Breath sounds: Normal breath sounds. No wheezing.  Abdominal:     General: Bowel sounds are normal.     Palpations: Abdomen is soft.     Tenderness: There is generalized abdominal tenderness. There is no guarding or rebound.  Musculoskeletal:        General: Normal range of motion.     Cervical back: Normal range of motion.  Skin:    General: Skin is  warm and dry.  Neurological:     Mental Status: She is alert.     (all labs ordered are listed, but only abnormal results are displayed) Labs Reviewed  CBC WITH DIFFERENTIAL/PLATELET - Abnormal; Notable for the following components:      Result Value   WBC 11.7 (*)    RBC 5.28 (*)    Hemoglobin 16.4 (*)    HCT 46.7 (*)    Neutro Abs 9.4 (*)    All other components within normal limits  URINALYSIS, ROUTINE W REFLEX MICROSCOPIC - Abnormal; Notable for the following components:   APPearance HAZY (*)    Ketones, ur 5 (*)    Leukocytes,Ua TRACE (*)    Bacteria, UA RARE (*)    All other components within normal limits  COMPREHENSIVE METABOLIC PANEL WITH GFR  LIPASE, BLOOD  HCG, SERUM, QUALITATIVE  SAMPLE TO BLOOD BANK    EKG: EKG Interpretation Date/Time:  Wednesday February 25 2024 10:36:32 EST Ventricular Rate:  76 PR Interval:  125 QRS Duration:  77 QT Interval:  379 QTC Calculation: 427 R Axis:   97  Text Interpretation: Sinus rhythm Borderline right axis deviation Confirmed by Cottie Cough [id number] on 02/25/2024 10:37:34 AM  Radiology: ARCOLA Abd 2 Views Result Date: 02/25/2024 CLINICAL DATA:  Abdominal pain.  Concern for obstruction. EXAM: DG ABDOMEN 2V COMPARISON:  CT abdomen pelvis dated 02/23/2024. FINDINGS: No bowel dilatation or evidence of obstruction. No free air or radiopaque calculi. No acute osseous pathology. IMPRESSION: Negative. Electronically Signed   By: Vanetta Chou M.D.   On: 02/25/2024 14:05     Procedures   Medications Ordered in the ED  sodium chloride  0.9 % bolus 1,000 mL (0 mLs Intravenous Stopped 02/25/24 1336)  ondansetron  (ZOFRAN ) injection 4 mg (4 mg Intravenous Given 02/25/24 1223)  pantoprazole (PROTONIX) injection 40 mg (40 mg Intravenous Given 02/25/24 1221)  morphine  (PF) 4 MG/ML injection 4 mg (4 mg Intravenous Given 02/25/24 1223)  ondansetron  (ZOFRAN ) injection 4 mg (4 mg Intravenous Given 02/25/24 1517)   diphenoxylate -atropine  (LOMOTIL ) 2.5-0.025 MG per tablet 2 tablet (2 tablets Oral Given 02/25/24 1517)                                    Medical Decision Making Patient returning after being seen here for 2 days ago with persistent abdominal pain described as burning which localizes in her epigastric area along with nausea vomiting and diarrhea.  She was prescribed both pantoprazole and Zofran  at her last visit and is scheduled to see Dr. Franchot with GI in 2 days.  Unfortunately she was unable to get her medications filled and she has been unable to tolerate any medications due to persistent nausea vomiting.  She has tried Pepcid and Pepto-Bismol but states  they both were promptly vomited.  Vomiting and diarrhea every 5-6 hours, endorses pink-tinged emesis, no blood in stools.  Differential diagnoses including PUD, gastritis, viral or bacterial gastroenteritis, bowel obstruction, hepatitis, pancreatitis, gallbladder disease.  GERD.  She had labs and CT imaging on Monday which were reassuring, repeat labs today are stable in comparison to [facility] evaluation.  She was given IV fluids here along with Zofran  after which she was able to tolerate p.o. intake.  Prior to discharge she was having return of her nausea was given an additional dose of Zofran  prior to receiving a dose of Lomotil .  Repeat exam, no guarding, benign abdomen.  Plan follow-up with Dr. Cindie.  She was given a prepack of Zofran  since she is unable to get her nausea medicine filled until Friday, she was encouraged to start taking the pantoprazole  once her nausea is resolved after taking the Zofran .  Of note she was given an IV dose of pantoprazole  here.  Lomotil  for abdominal cramping and diarrhea.  Also recommended trial of Pepcid while she awaits further evaluation by Dr. Cindie.  Amount and/or Complexity of Data Reviewed Labs: ordered.    Details: Labs are reassuring, urinalysis negative for UTI, c-Met and lipase all normal, her WBC  count is elevated 11.7, she has a hemoglobin of 16.4, suspect this is due to hemoconcentration.  She was given IV fluids while here. Radiology: ordered.    Details: 2 view abdomen reviewed, normal bowel gas pattern, no evidence for SBO.  Risk Prescription drug management.        Final diagnoses:  Epigastric pain  Nausea vomiting and diarrhea    ED Discharge Orders          Ordered    diphenoxylate -atropine  (LOMOTIL ) 2.5-0.025 MG tablet  4 times daily PRN        02/25/24 1457    ondansetron  (ZOFRAN ) 4 MG tablet  Every 8 hours PRN        02/25/24 1457               Birdena Clarity, PA-C 02/25/24 1540    Cottie Donnice PARAS, MD 02/25/24 1742

## 2024-02-25 NOTE — Discharge Instructions (Signed)
 We have given you some samples of Zofran  which should help with your nausea.  Take this medication before attempting to take your other medicines including your antacid and the Lomotil  which I have prescribed you.  You received a dose of Lomotil  here, this will help with the abdominal cramping and your diarrhea, you may not need additional doses of this however as 1 dose frequently will resolve your symptoms.

## 2024-02-25 NOTE — ED Triage Notes (Signed)
 Pt c/o Abd pain, generalized, sharp pain in the middle of abd and burning pain on bilateral sides that radiates to mid chest with burning sensation rates pain 10/10 x 3 days also c/o diarrhea and vomiting bright red blood x 3 days--states she was seen here Monday and was referred to GI doctor but could not get in to see GI until Friday and stated I couldn't wait that long.

## 2024-02-25 NOTE — ED Notes (Signed)
 Pt states it is bright red vomit

## 2024-02-26 ENCOUNTER — Telehealth: Payer: Self-pay | Admitting: Internal Medicine

## 2024-02-26 MED FILL — Ondansetron HCl Tab 4 MG: ORAL | Qty: 4 | Status: AC

## 2024-02-26 NOTE — Telephone Encounter (Signed)
 Called patient on 11/18, 11/19 and today 11/20 on the phone number on file. No answer. No voicemail.

## 2024-05-25 ENCOUNTER — Ambulatory Visit: Admitting: Obstetrics & Gynecology
